# Patient Record
Sex: Female | Born: 1965 | Race: White | Hispanic: No | Smoking: Former smoker
Health system: Southern US, Community
[De-identification: ages and names within clinical notes are randomized; demographics above are authoritative.]

## PROBLEM LIST (undated history)

## (undated) DIAGNOSIS — E669 Obesity, unspecified: Secondary | ICD-10-CM

## (undated) DIAGNOSIS — E119 Type 2 diabetes mellitus without complications: Principal | ICD-10-CM

## (undated) DIAGNOSIS — J439 Emphysema, unspecified: Secondary | ICD-10-CM

## (undated) DIAGNOSIS — K219 Gastro-esophageal reflux disease without esophagitis: Secondary | ICD-10-CM

## (undated) DIAGNOSIS — L80 Vitiligo: Secondary | ICD-10-CM

## (undated) DIAGNOSIS — F419 Anxiety disorder, unspecified: Secondary | ICD-10-CM

## (undated) DIAGNOSIS — R52 Pain, unspecified: Secondary | ICD-10-CM

## (undated) DIAGNOSIS — I10 Essential (primary) hypertension: Secondary | ICD-10-CM

## (undated) DIAGNOSIS — M797 Fibromyalgia: Secondary | ICD-10-CM

## (undated) DIAGNOSIS — M1990 Unspecified osteoarthritis, unspecified site: Secondary | ICD-10-CM

## (undated) DIAGNOSIS — I8390 Asymptomatic varicose veins of unspecified lower extremity: Secondary | ICD-10-CM

## (undated) DIAGNOSIS — J45909 Unspecified asthma, uncomplicated: Secondary | ICD-10-CM

## (undated) DIAGNOSIS — E785 Hyperlipidemia, unspecified: Secondary | ICD-10-CM

## (undated) DIAGNOSIS — F32A Depression, unspecified: Secondary | ICD-10-CM

## (undated) DIAGNOSIS — F329 Major depressive disorder, single episode, unspecified: Secondary | ICD-10-CM

## (undated) HISTORY — DX: Pain, unspecified: R52

## (undated) HISTORY — DX: Hyperlipidemia, unspecified: E78.5

## (undated) HISTORY — DX: Fibromyalgia: M79.7

## (undated) HISTORY — DX: Vitiligo: L80

## (undated) HISTORY — DX: Essential (primary) hypertension: I10

## (undated) HISTORY — DX: Type 2 diabetes mellitus without complications: E11.9

## (undated) HISTORY — DX: Major depressive disorder, single episode, unspecified: F32.9

## (undated) HISTORY — PX: CHOLECYSTECTOMY: SHX55

## (undated) HISTORY — DX: Unspecified asthma, uncomplicated: J45.909

## (undated) HISTORY — PX: GALLBLADDER SURGERY: SHX652

## (undated) HISTORY — PX: TUBAL LIGATION: SHX77

## (undated) HISTORY — DX: Asymptomatic varicose veins of unspecified lower extremity: I83.90

## (undated) HISTORY — DX: Anxiety disorder, unspecified: F41.9

## (undated) HISTORY — PX: LEG SURGERY: SHX1003

## (undated) HISTORY — PX: DILATION AND CURETTAGE OF UTERUS: SHX78

## (undated) HISTORY — DX: Emphysema, unspecified: J43.9

## (undated) HISTORY — DX: Depression, unspecified: F32.A

## (undated) HISTORY — DX: Obesity, unspecified: E66.9

## (undated) HISTORY — DX: Gastro-esophageal reflux disease without esophagitis: K21.9

## (undated) HISTORY — DX: Unspecified osteoarthritis, unspecified site: M19.90

---

## 2001-05-02 ENCOUNTER — Encounter: Payer: Self-pay | Admitting: Obstetrics and Gynecology

## 2001-05-02 ENCOUNTER — Ambulatory Visit (HOSPITAL_COMMUNITY): Admission: RE | Admit: 2001-05-02 | Discharge: 2001-05-02 | Payer: Self-pay | Admitting: Obstetrics and Gynecology

## 2001-08-22 ENCOUNTER — Other Ambulatory Visit: Admission: RE | Admit: 2001-08-22 | Discharge: 2001-08-22 | Payer: Self-pay | Admitting: Obstetrics and Gynecology

## 2001-09-07 ENCOUNTER — Ambulatory Visit: Admission: RE | Admit: 2001-09-07 | Discharge: 2001-09-07 | Payer: Self-pay | Admitting: Internal Medicine

## 2002-08-31 ENCOUNTER — Encounter: Payer: Self-pay | Admitting: Obstetrics and Gynecology

## 2002-08-31 ENCOUNTER — Ambulatory Visit (HOSPITAL_COMMUNITY): Admission: RE | Admit: 2002-08-31 | Discharge: 2002-08-31 | Payer: Self-pay | Admitting: Obstetrics and Gynecology

## 2003-10-24 ENCOUNTER — Ambulatory Visit (HOSPITAL_COMMUNITY): Admission: RE | Admit: 2003-10-24 | Discharge: 2003-10-24 | Payer: Self-pay | Admitting: Obstetrics and Gynecology

## 2004-01-23 ENCOUNTER — Ambulatory Visit (HOSPITAL_COMMUNITY): Admission: RE | Admit: 2004-01-23 | Discharge: 2004-01-23 | Payer: Self-pay | Admitting: Orthopedic Surgery

## 2006-08-02 ENCOUNTER — Ambulatory Visit (HOSPITAL_COMMUNITY): Admission: RE | Admit: 2006-08-02 | Discharge: 2006-08-02 | Payer: Self-pay | Admitting: Obstetrics and Gynecology

## 2007-09-01 ENCOUNTER — Other Ambulatory Visit: Admission: RE | Admit: 2007-09-01 | Discharge: 2007-09-01 | Payer: Self-pay | Admitting: Obstetrics and Gynecology

## 2009-07-16 ENCOUNTER — Ambulatory Visit (HOSPITAL_COMMUNITY): Admission: RE | Admit: 2009-07-16 | Discharge: 2009-07-16 | Payer: Self-pay | Admitting: Obstetrics & Gynecology

## 2009-07-25 ENCOUNTER — Other Ambulatory Visit: Admission: RE | Admit: 2009-07-25 | Discharge: 2009-07-25 | Payer: Self-pay | Admitting: Obstetrics and Gynecology

## 2009-07-25 ENCOUNTER — Ambulatory Visit (HOSPITAL_COMMUNITY): Admission: RE | Admit: 2009-07-25 | Discharge: 2009-07-25 | Payer: Self-pay | Admitting: Obstetrics and Gynecology

## 2010-12-05 ENCOUNTER — Emergency Department (HOSPITAL_COMMUNITY): Payer: Self-pay

## 2010-12-05 ENCOUNTER — Emergency Department (HOSPITAL_COMMUNITY)
Admission: EM | Admit: 2010-12-05 | Discharge: 2010-12-05 | Disposition: A | Discharge status: Home / Self Care | Payer: Self-pay | Attending: Emergency Medicine | Admitting: Emergency Medicine

## 2010-12-05 DIAGNOSIS — I10 Essential (primary) hypertension: Secondary | ICD-10-CM | POA: Insufficient documentation

## 2010-12-05 DIAGNOSIS — J189 Pneumonia, unspecified organism: Secondary | ICD-10-CM | POA: Insufficient documentation

## 2010-12-05 DIAGNOSIS — Z79899 Other long term (current) drug therapy: Secondary | ICD-10-CM | POA: Insufficient documentation

## 2010-12-05 DIAGNOSIS — J45909 Unspecified asthma, uncomplicated: Secondary | ICD-10-CM | POA: Insufficient documentation

## 2010-12-05 DIAGNOSIS — IMO0001 Reserved for inherently not codable concepts without codable children: Secondary | ICD-10-CM | POA: Insufficient documentation

## 2010-12-26 NOTE — H&P (Signed)
NAMEPLACIDA, Erica Graham              ACCOUNT NO.:  [id number]   MEDICAL RECORD NO.:  [id number]          PATIENT TYPE:  AMB   LOCATION:                                FACILITY:  [facility]   PHYSICIAN:  Tilda Burrow, M.D. DATE OF BIRTH:  Nov 16, 1965   DATE OF ADMISSION:  DATE OF DISCHARGE:  LH                              HISTORY & PHYSICAL   ADMISSION DIAGNOSIS:  1. Chronic dysmenorrhea, menorrhagia.  2. Long term Depo-Provera use, requesting discontinuation.   HISTORY OF PRESENT ILLNESS:  This 45 year old female with 15 years of  Depo-Provera use for control of menses is admitted at this time for  endometrial ablation to resolve the bleeding by a method that would  allow her to get off the Depo-Provera, thus avoiding the issues of  chronic bone-thinning with long-term Depo-Provera use.  The plans are  for tubal sterilization as well as endometrial ablation to be performed  on December 24.  She understands the permanence of the requested  procedure.   PAST MEDICAL HISTORY:  1. Chronic pain syndrome with fibromyalgia.  2. Chronic neck soreness secondary to fibromyalgia.  3. Previously mentioned lifelong dysmenorrhea.  4. Hypertension.   PAST SURGICAL HISTORY:  1. Cholecystectomy.  2. Dilatation and curettage x2.   PHYSICAL EXAMINATION:   PHYSICAL EXAMINATION:  GENERAL:  Large framed, Caucasian female.  VITAL SIGNS:  Height 5 feet 5, weight 248.  Blood pressure 150/80, pulse  70.  HEENT:  Pupils are equal, round and reactive.  NECK:  Supple. Normal thyroid.  BREASTS:  Deferred.  CHEST:  Clear to auscultation.  ABDOMEN:  Nontender.  Obese.  Well-healed surgical scars.  EXTERNAL GENITALIA:  Multiparous.  PELVIC:  Vaginal length significant.  Cervix palpably normal.  Uterus  mobile, nontender without masses or  tenderness in either adnexa.  EXTREMITIES:  Grossly normal.   PLAN:  LTS Falope rings.  Endometrial ablation.  Hysteroscopy and  dilatation and curettage August 02, 2006.      Tilda Burrow, M.D.  Electronically Signed     JVF/MEDQ  D:  07/28/2006  T:  07/28/2006  Job:  [id number]

## 2010-12-26 NOTE — Op Note (Signed)
NAMETARRIE, MCMICHEN                        ACCOUNT NO.:  [id number]   MEDICAL RECORD NO.:  [id number]                   PATIENT TYPE:  OIB   LOCATION:  2887                                 FACILITY:  [facility]   PHYSICIAN:  Nadara Mustard, M.D.                DATE OF BIRTH:  March 21, 1966   DATE OF PROCEDURE:  01/23/2004  DATE OF DISCHARGE:                                 OPERATIVE REPORT   PREOPERATIVE DIAGNOSIS:  Trimalleolar left ankle fracture.   POSTOPERATIVE DIAGNOSIS:  Trimalleolar left ankle fracture.   OPERATION PERFORMED:  Open reduction internal fixation left fibula.   SURGEON:  Nadara Mustard, M.D.   ANESTHESIA:  General.   ESTIMATED BLOOD LOSS:  Minimal.   ANTIBIOTICS:  1g Kefzol.   TOURNIQUET TIME:  None.   DISPOSITION:  To post anesthesia care unit in stable condition.   INDICATIONS FOR PROCEDURE:  The patient is a 45 year old woman who was  status post a left trimalleolar ankle fracture, status post a fall from  stepping off a curb.  The patient was initially evaluated at the [facility] area, was placed in a splint and referred to my office.  The patient  has an unstable fracture and presents at this time for surgical  intervention.  The risks and benefits were discussed including infection,  neurovascular injury, persistent pain, arthritis, need for additional  surgery.  The patient states she understands and wishes to proceed at this  time.   DESCRIPTION OF PROCEDURE:  The patient was brought to operating room 1 and  underwent a popliteal block.  After adequate level of anesthesia obtained,  the patient's left lower extremity was prepped using DuraPrep and draped  into a sterile field.  An Collier Flowers was used to cover all exposed skin.  A  lateral incision was made over the fibula.  This was carried down to the  fracture sites.  Subperiosteal dissection was performed.  The fracture was  reduced and was a very large oblique fracture.  This was reduced then  and  two 3.5 cortical screws were used for lag screw fixation.  A neutralization  plate was placed laterally with two locking screws distally and three  locking screws proximally.  The wound was irrigated with normal saline.  C-  arm fluoroscopy verified reduction.  The ankle was congruent.  There was no  subluxation with stressing laterally.  The subcutaneous was closed using 2-0  Vicryl.  The skin was closed using Proximate staples.  The wound was covered  with Adaptic orthopedic sponges, sterile Webril and a Coban dressing.  The  patient was taken to the PACU in stable condition.  Plan for  nonweightbearing on the left lower extremity, continue with her crutches and  wheel chair.  Prescription given for Tylox for pain, plan for  discharge to home, plan to follow up in one week.  Hemostasis was obtained.  The wound  was closed using 2-0 nylon.  The wound  was covered with Adaptic orthopedic sponges, Kerlix and a Coban dressing.  The patient was then taken to PACU in stable condition.                                               Nadara Mustard, M.D.    MVD/MEDQ  D:  01/23/2004  T:  01/23/2004  Job:  [id number]

## 2010-12-26 NOTE — Op Note (Signed)
NAMESOPHYA, VANBLARCOM NO.:  [id number]   MEDICAL RECORD NO.:  [id number]          PATIENT TYPE:  AMB   LOCATION:  DAY                           FACILITY:  [facility]   PHYSICIAN:  Tilda Burrow, M.D. DATE OF BIRTH:  1965/12/24   DATE OF PROCEDURE:  08/02/2006  DATE OF DISCHARGE:                               OPERATIVE REPORT   PREOPERATIVE DIAGNOSIS:  Menorrhagia, long term Depo-Provera use x14  years.  Elective sterilization.   POSTOPERATIVE DIAGNOSIS:  Menorrhagia, long term Depo-Provera use x14  years.  Elective sterilization.   OPERATION PERFORMED:  Laparoscopic tubal sterilization, Falope rings,  hysteroscopy, endometrial ablation not performed.  Laparoscopic tubal  sterilization only performed.   SURGEON:  Tilda Burrow, M.D.   ANESTHESIA:   DESCRIPTION OF PROCEDURE:  The patient was taken to the operating room,  prepped and draped for abdominal and vaginal procedure.  Attention was  first directed to the abdomen where umbilical incision was performed as  well as suprapubic incision.  The Metzenbaum scissors were used probe  down through the fatty tissue sufficiently to allow access to the  fascia.  Veress needle was used using the standard water droplet  technique to confirm intraperitoneal location and pneumoperitoneum  achieved.  The 5 mm laparoscopic cannula was inserted  and pelvis  inspected.  We had to convert initial cameras due to technical  difficulties with the initial camera.  Checking our light source and the  __grounding____ wire and the fiberoptics proved to be negative.  After  satisfactory equipment was obtained with suprapubic trocar had been  inserted under direct visualization and attention directed to the left  tube which could be identified and elevated and the fallopian elevated,  identified and a Falope ring applied onto midportion.  The patient's  right side was more technically challenging due to the presence of bowel  that  could not be moved away from even despite maximized Trendelenburg  position and effort at uterine manipulation with the Hulka uterine  manipulator.  The cornu could eventually be exposed sufficiently to  identify both the utero-ovarian ligament and the fallopian tube on the  left, on the patient's right side and the Falope ring applied without  further ado.  Deflation of the abdomen was then followed by instilling  of 120 mL of saline and then the skin closed with subcuticular 4-0 Dexon  closure.   Hysteroscopy was then prepared for.  Grasped the cervix between the  opening of a standard Graves speculum and sounded the uterus.  The  uterus sound had absolutely no resistance and went sufficiently deep  that it is felt certain uterine perforation was present, was very small  and at this point procedure was discontinued.  It is felt likely that  the uterine perforation, small, occurred with the Hulka tenaculum during  uterine manipulation.  There is no suspicion of  bowel injury during this and the procedure was discontinued.  Efforts at  hysteroscopy will be rescheduled when endometrial ablation can be  performed as well.  The patient tolerated the procedure well.  Went to  recovery  room in good condition.  Will be monitored in follow-up.      Tilda Burrow, M.D.  Electronically Signed     JVF/MEDQ  D:  08/02/2006  T:  08/02/2006  Job:  [id number]

## 2011-01-01 ENCOUNTER — Emergency Department (HOSPITAL_COMMUNITY): Payer: Self-pay

## 2011-01-01 ENCOUNTER — Emergency Department (HOSPITAL_COMMUNITY)
Admission: EM | Admit: 2011-01-01 | Discharge: 2011-01-01 | Disposition: A | Discharge status: Home / Self Care | Payer: Self-pay | Attending: Emergency Medicine | Admitting: Emergency Medicine

## 2011-01-01 DIAGNOSIS — R059 Cough, unspecified: Secondary | ICD-10-CM | POA: Insufficient documentation

## 2011-01-01 DIAGNOSIS — I10 Essential (primary) hypertension: Secondary | ICD-10-CM | POA: Insufficient documentation

## 2011-01-01 DIAGNOSIS — J40 Bronchitis, not specified as acute or chronic: Secondary | ICD-10-CM | POA: Insufficient documentation

## 2011-01-01 DIAGNOSIS — R079 Chest pain, unspecified: Secondary | ICD-10-CM | POA: Insufficient documentation

## 2011-01-01 DIAGNOSIS — Z8701 Personal history of pneumonia (recurrent): Secondary | ICD-10-CM | POA: Insufficient documentation

## 2011-01-01 DIAGNOSIS — J45909 Unspecified asthma, uncomplicated: Secondary | ICD-10-CM | POA: Insufficient documentation

## 2011-01-01 DIAGNOSIS — R05 Cough: Secondary | ICD-10-CM | POA: Insufficient documentation

## 2011-01-01 DIAGNOSIS — IMO0001 Reserved for inherently not codable concepts without codable children: Secondary | ICD-10-CM | POA: Insufficient documentation

## 2012-05-29 IMAGING — CR DG CHEST 2V
2 series · 2 of 2 positions shown · non-contrast
Comparison: 01/23/2004

CLINICAL DATA: Short of breath/cough

CHEST - 2 VIEW

[view not recorded (1 of 2)]
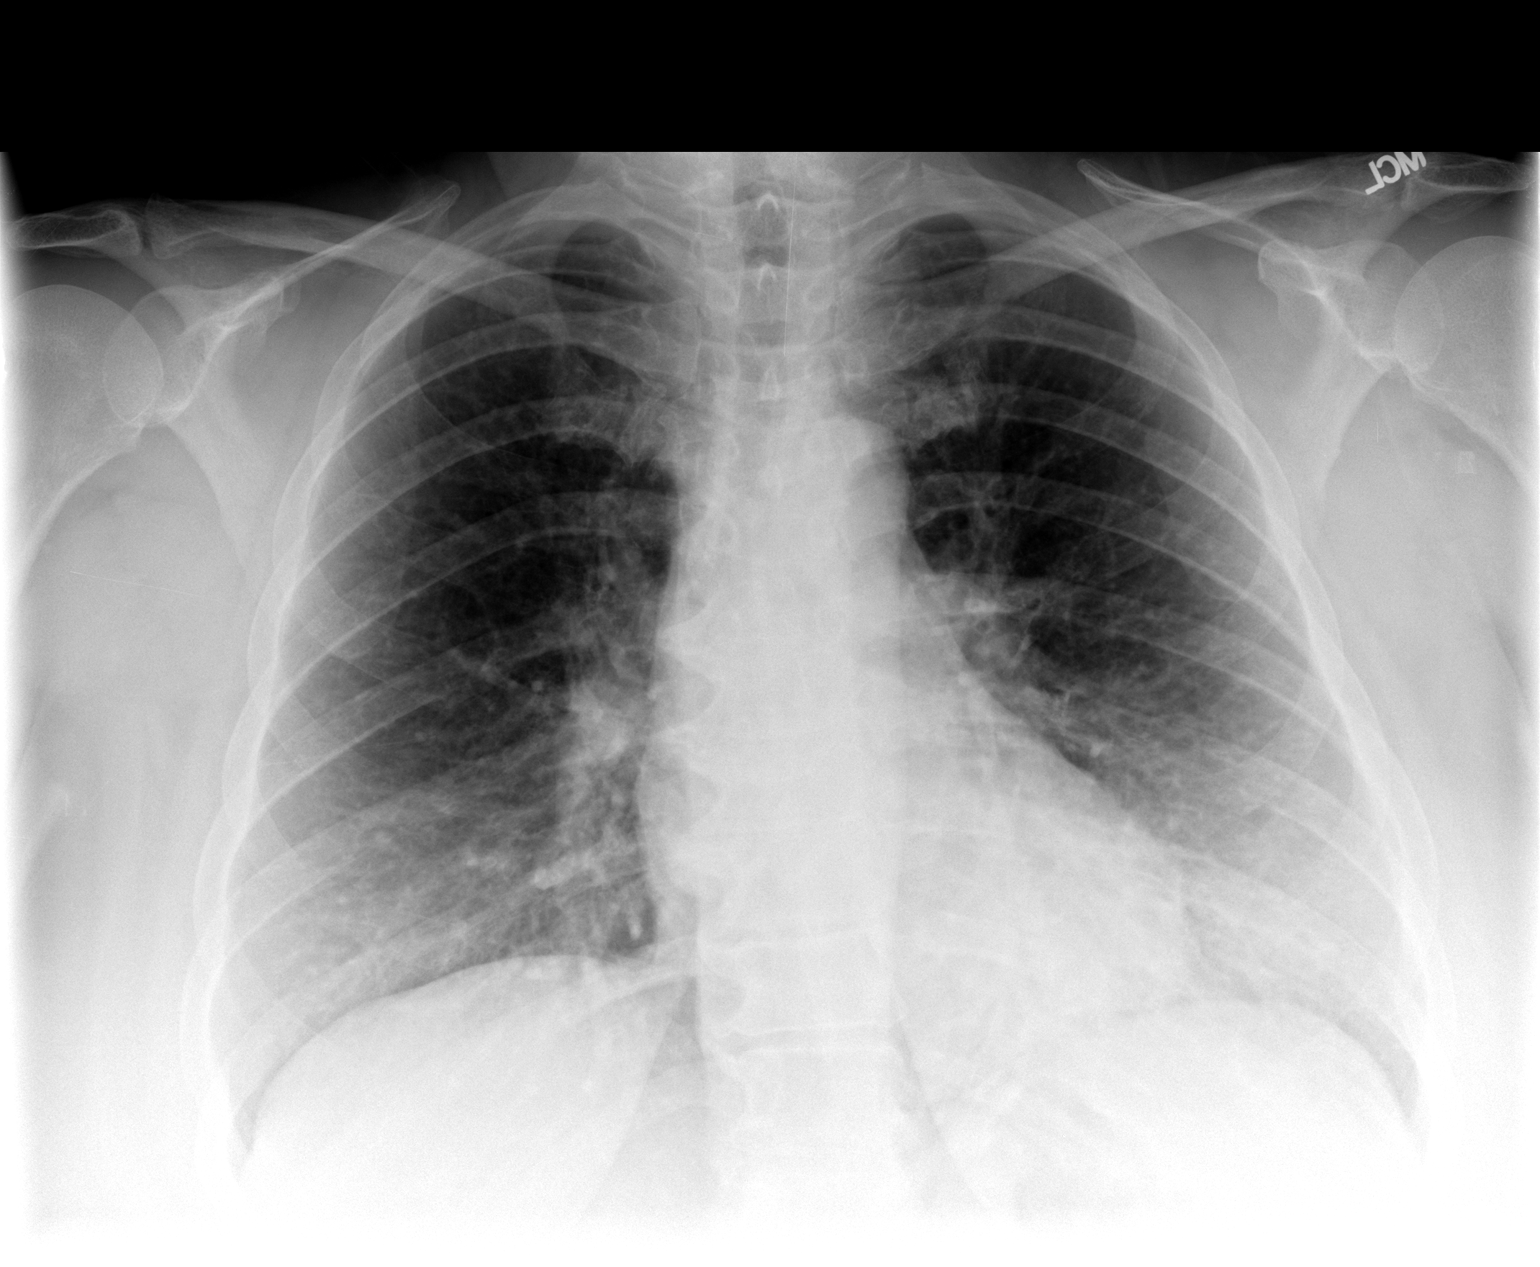

[view not recorded (2 of 2)]
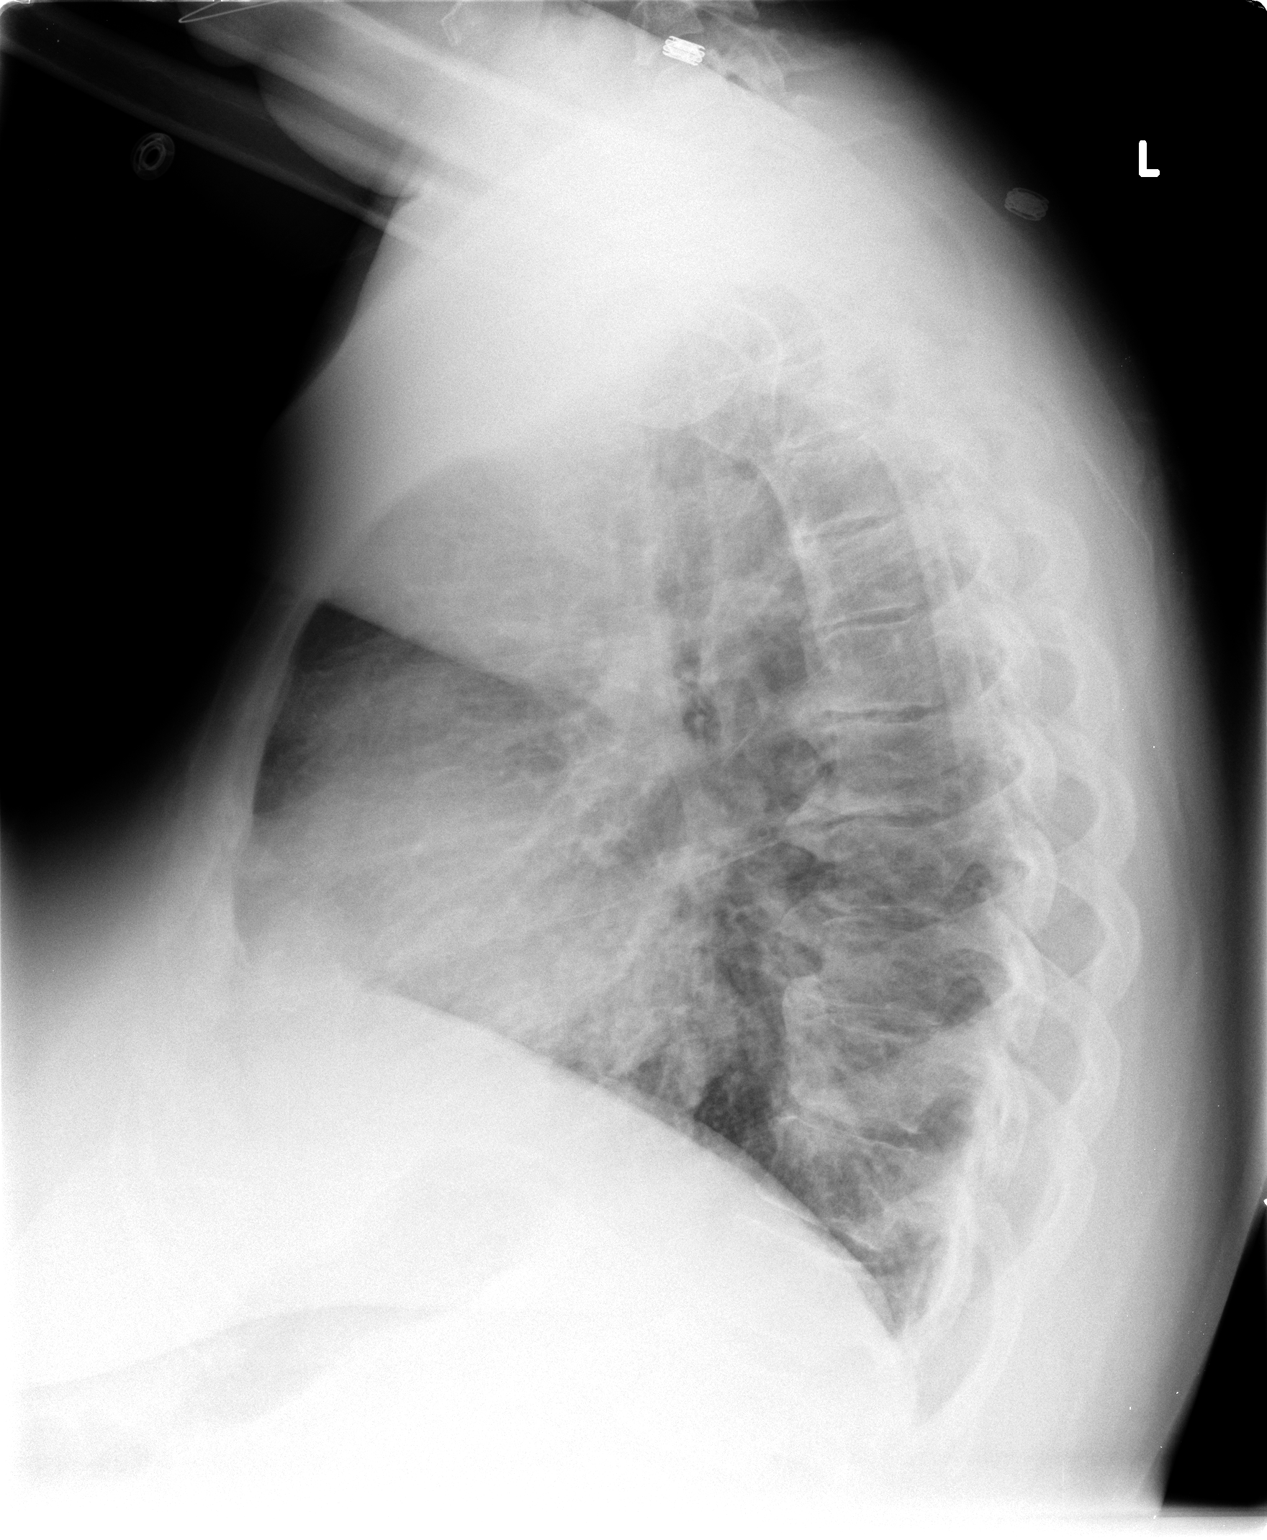

[2 of 2 positions shown; findings below may reference images not displayed]

FINDINGS: Heart and mediastinal contours normal.  There are bronchitic type
changes.  However, in addition, there is a suspicion of an acute
infiltrate in the left lower lobe.  This is seen through the left
heart shadow on the PA image, and in the retrocardiac area on the
lateral view.  No pleural fluid.  No heart failure.
IMPRESSION: Bronchitic type changes but also suspicious for left lower lobe
pneumonia.

## 2012-06-25 IMAGING — CR DG CHEST 2V
2 series · 2 of 2 positions shown · non-contrast
Comparison: 12/05/2010 and [id number].

CLINICAL DATA: Cough.  Pneumonia for 5 weeks with new chest and
left-sided pain at the diaphragm.  Shortness of breath with cough
and congestion.

CHEST - 2 VIEW

[view not recorded (1 of 2)]
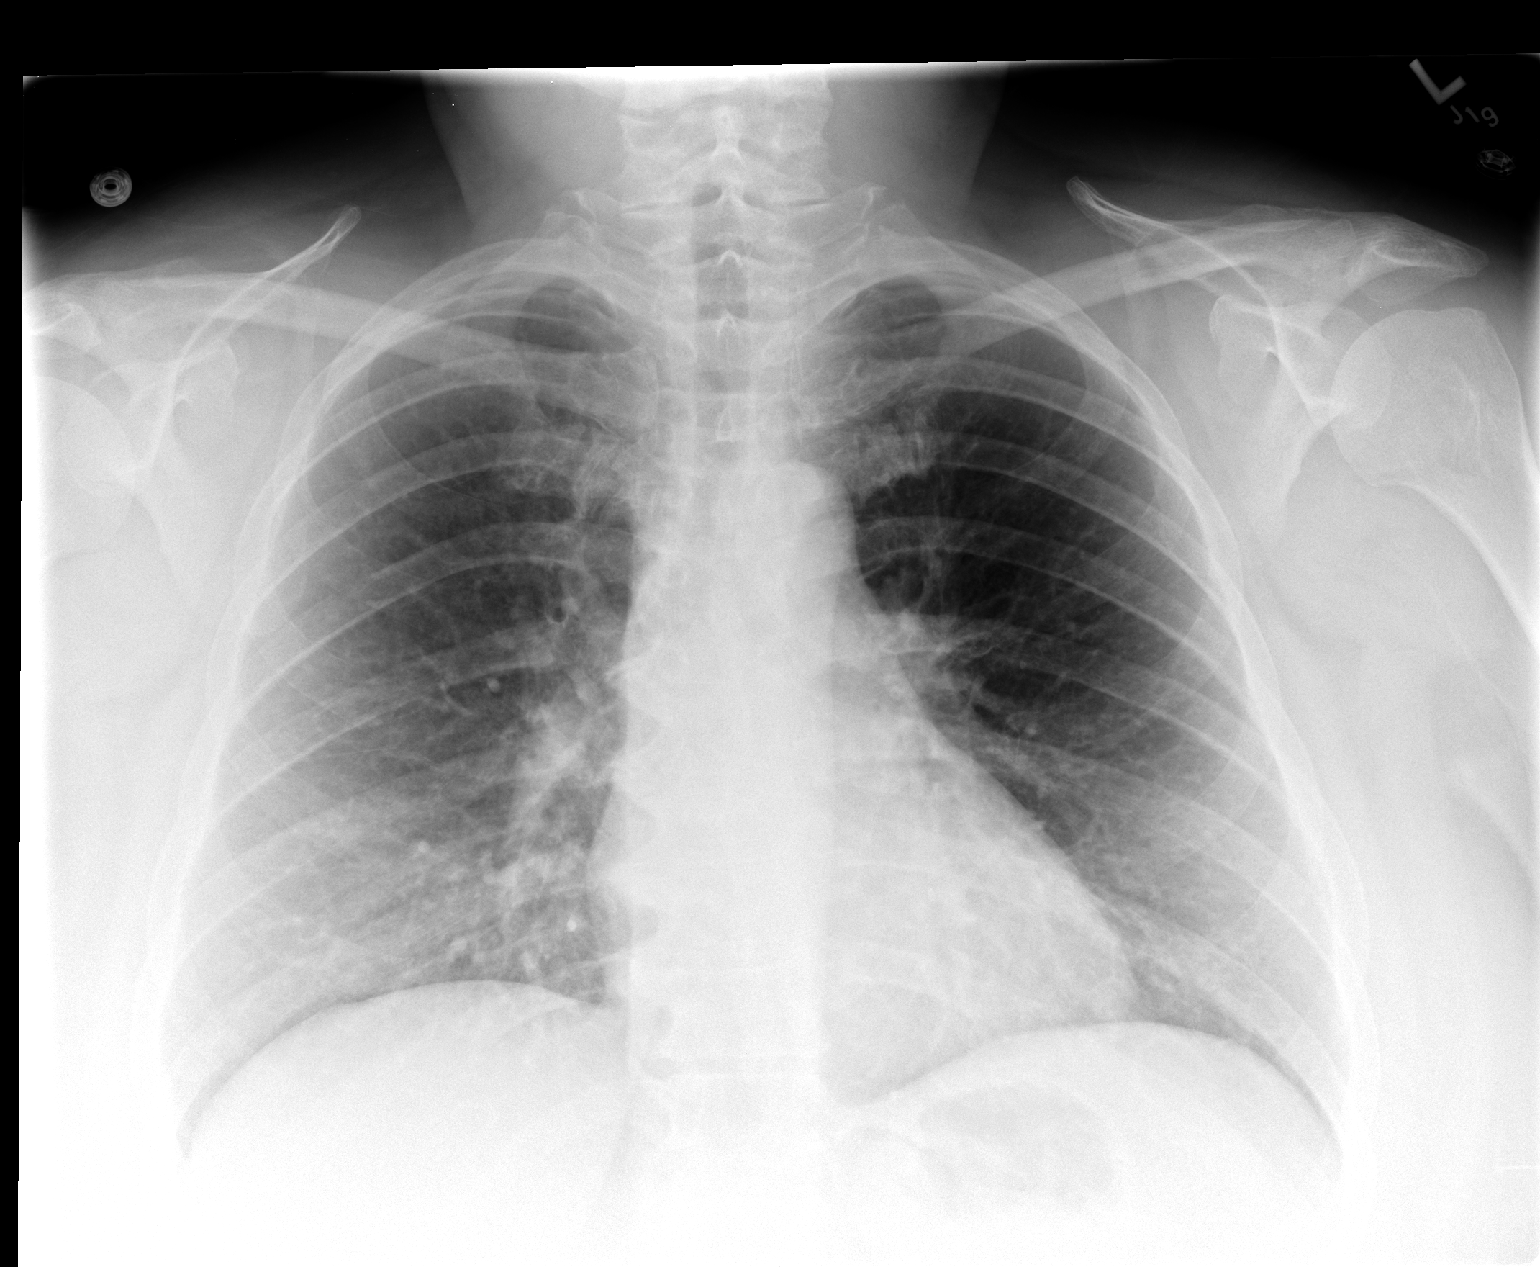

[view not recorded (2 of 2)]
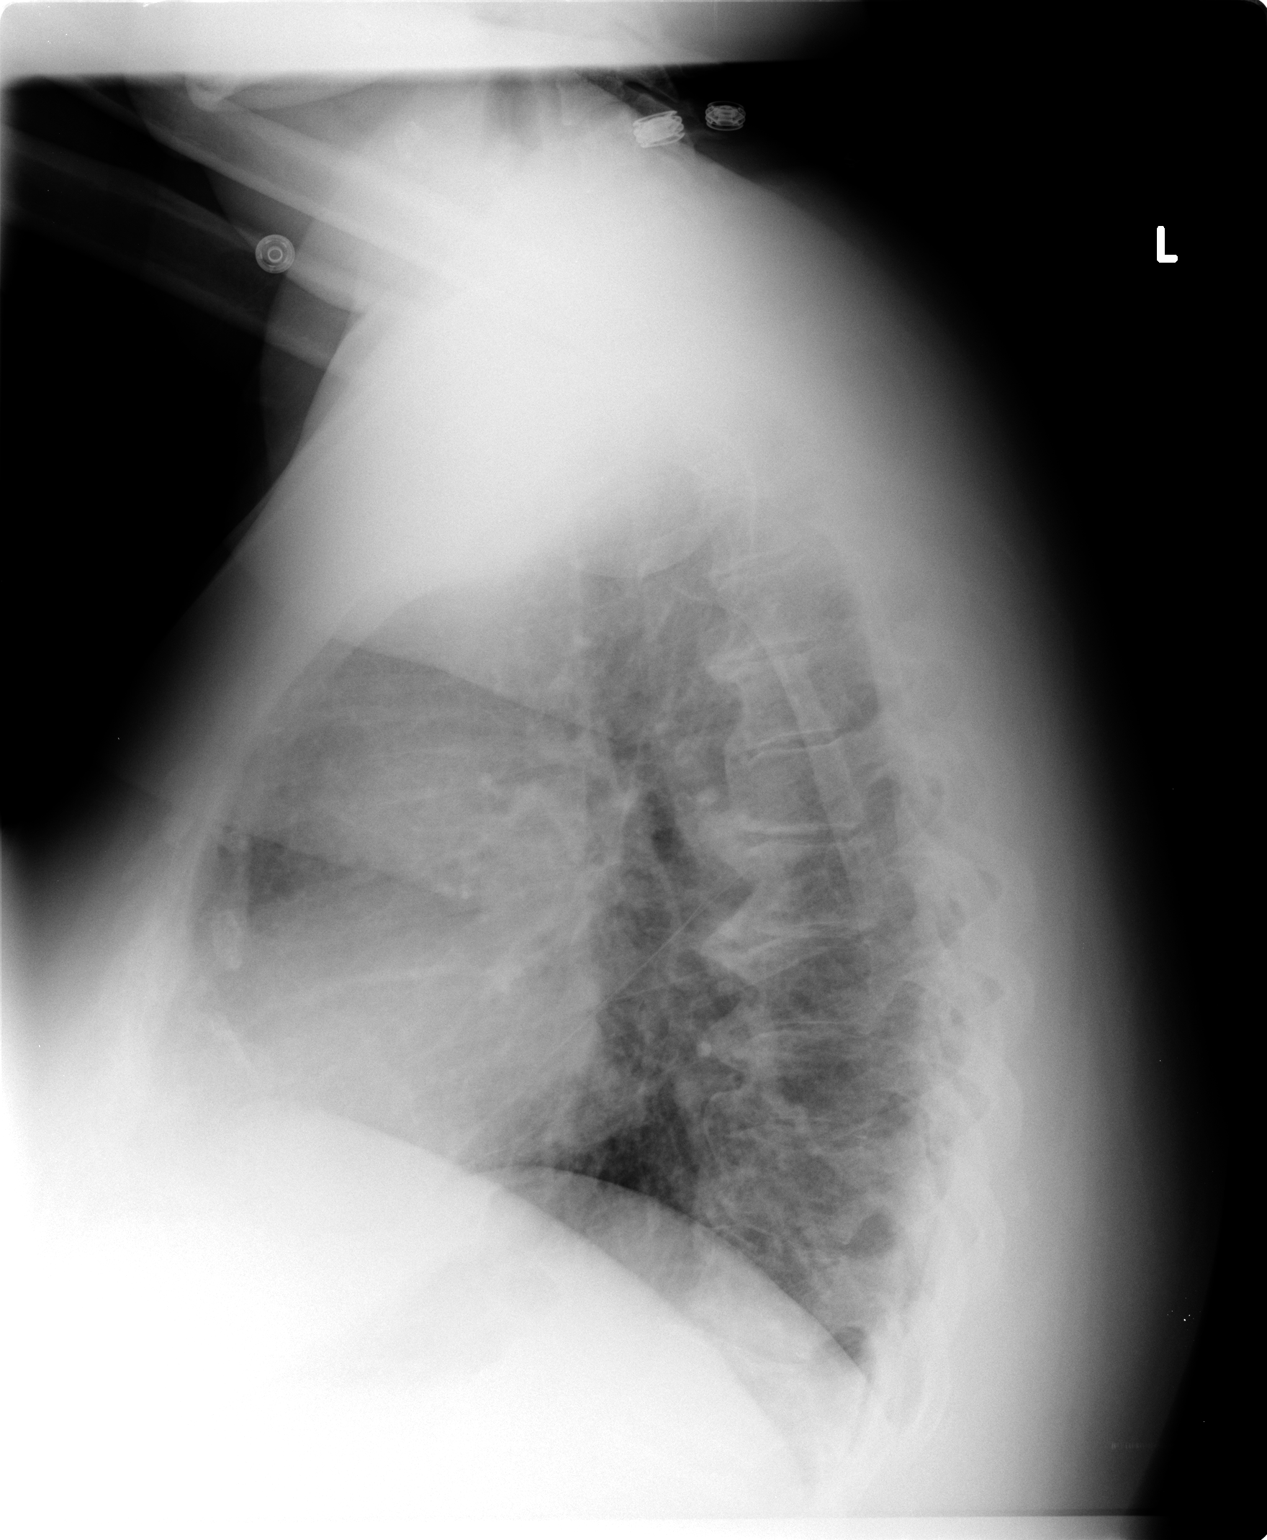

[2 of 2 positions shown; findings below may reference images not displayed]

FINDINGS: Heart and mediastinal contours are within normal limits.
There are coarse interstitial markings identified throughout both
lung fields compatible with the patient's history of smoking and
underlying bronchitic change.  No focal infiltrates or signs of
congestive failure are seen.  No pleural fluid or peribronchial
cuffing is noted. Focal density seen previously at the left lower
lobe has resolved.
IMPRESSION: Interval resolution of suspected area of left lower lobe pneumonia.
No new focal acute or worrisome findings noted.  Stable bronchitic
change

## 2016-03-05 ENCOUNTER — Encounter (INDEPENDENT_AMBULATORY_CARE_PROVIDER_SITE_OTHER): Payer: Self-pay | Admitting: *Deleted

## 2016-03-05 ENCOUNTER — Encounter: Payer: Self-pay | Admitting: Adult Health

## 2016-03-05 ENCOUNTER — Other Ambulatory Visit (HOSPITAL_COMMUNITY)
Admission: RE | Admit: 2016-03-05 | Discharge: 2016-03-05 | Disposition: A | Discharge status: Home / Self Care | Payer: BLUE CROSS/BLUE SHIELD | Source: Ambulatory Visit | Attending: Adult Health | Admitting: Adult Health

## 2016-03-05 ENCOUNTER — Ambulatory Visit (INDEPENDENT_AMBULATORY_CARE_PROVIDER_SITE_OTHER): Payer: BLUE CROSS/BLUE SHIELD | Admitting: Adult Health

## 2016-03-05 VITALS — BP 140/80 | HR 80 | Ht 65.5 in | Wt 242.5 lb

## 2016-03-05 DIAGNOSIS — Z1211 Encounter for screening for malignant neoplasm of colon: Secondary | ICD-10-CM | POA: Diagnosis not present

## 2016-03-05 DIAGNOSIS — L80 Vitiligo: Secondary | ICD-10-CM | POA: Insufficient documentation

## 2016-03-05 DIAGNOSIS — Z1151 Encounter for screening for human papillomavirus (HPV): Secondary | ICD-10-CM | POA: Diagnosis not present

## 2016-03-05 DIAGNOSIS — I8390 Asymptomatic varicose veins of unspecified lower extremity: Secondary | ICD-10-CM

## 2016-03-05 DIAGNOSIS — I8393 Asymptomatic varicose veins of bilateral lower extremities: Secondary | ICD-10-CM | POA: Diagnosis not present

## 2016-03-05 DIAGNOSIS — Z01419 Encounter for gynecological examination (general) (routine) without abnormal findings: Secondary | ICD-10-CM | POA: Insufficient documentation

## 2016-03-05 DIAGNOSIS — Z124 Encounter for screening for malignant neoplasm of cervix: Secondary | ICD-10-CM | POA: Diagnosis not present

## 2016-03-05 DIAGNOSIS — R52 Pain, unspecified: Secondary | ICD-10-CM | POA: Diagnosis not present

## 2016-03-05 DIAGNOSIS — F419 Anxiety disorder, unspecified: Secondary | ICD-10-CM | POA: Diagnosis not present

## 2016-03-05 DIAGNOSIS — Z01411 Encounter for gynecological examination (general) (routine) with abnormal findings: Secondary | ICD-10-CM

## 2016-03-05 DIAGNOSIS — Z139 Encounter for screening, unspecified: Secondary | ICD-10-CM

## 2016-03-05 HISTORY — DX: Asymptomatic varicose veins of unspecified lower extremity: I83.90

## 2016-03-05 HISTORY — DX: Anxiety disorder, unspecified: F41.9

## 2016-03-05 HISTORY — DX: Vitiligo: L80

## 2016-03-05 HISTORY — DX: Pain, unspecified: R52

## 2016-03-05 LAB — HEMOCCULT GUIAC POC 1CARD (OFFICE): FECAL OCCULT BLD: NEGATIVE

## 2016-03-05 NOTE — Progress Notes (Signed)
Patient ID: Erica Graham, female   DOB: Oct 20, 1965, 50 y.o.   MRN: [id number] History of Present Illness: Levern is a 50 year old white female, single in for a well woman gyn exam and pap.She has not been to doctor in years.She is hair Ecologist.   Current Medications, Allergies, Past Medical History, Past Surgical History, Family History and Social History were reviewed in [employer] record.     Review of Systems: Patient denies any daily headaches(but has occasionally) , hearing loss, fatigue, blurred vision, shortness of breath, chest pain, abdominal pain, problems with bowel movements, urination, or intercourse(not having sex).She has body aches from arthritis and fibromyalgia and has pain in right leg where has varicose vein.She has mood swings and anxiety but declines meds. She says some due to brother death this year and has 90 yo daughter getting Water quality scientist. 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   Physical Exam:BP 140/80 (BP Location: Left Arm, Patient Position: Sitting, Cuff Size: Large)   Pulse 80   Ht 5' 5.5" (1.664 m)   Wt 242 lb 8 oz (110 kg)   BMI 39.74 kg/m  General:  Well developed, well nourished, no acute distress Skin:  Warm and dry,has nodules/cysts on elbows and fingers and she has vitiligo from birth  Neck:  Midline trachea, normal thyroid, good ROM, no lymphadenopathy Lungs; Clear to auscultation bilaterally Breast:  No dominant palpable mass, retraction, or nipple discharge Cardiovascular: Regular rate and rhythm Abdomen:  Soft, non tender, no hepatosplenomegaly Pelvic:  External genitalia is normal in appearance, no lesions.  The vagina is normal in appearance. Urethra has no lesions or masses. The cervix is bulbous. Pap with HPV performed. Uterus is felt to be normal size, shape, and contour.  No adnexal masses or tenderness noted.Bladder is non tender, no masses felt. Rectal: Good sphincter tone, no polyps, or hemorrhoids felt.  Hemoccult  negative. Extremities/musculoskeletal:  No swelling, +varicosities in right leg noted, no clubbing or cyanosis Psych:  No mood changes, alert and cooperative,seems happy   Impression: Well woman gyn exam and pap Anxiety Body aches Varicose vein Vitiligo     Plan: Referred to Dr Laural Golden for screening colonoscopy Check CBC,CMP,TSH and lipids,A1c and vitamin D Get mammogram now, number given Call Dr Ree Edman for arthritis And call [facility] for consult  Physical in 1 year, pap in 3 if normal

## 2016-03-05 NOTE — Patient Instructions (Signed)
Call Dr Charlestine Night and [facility] Get mammogram Referred to Dr Laural Golden for colonoscopy  Physical in 1 year, pap in 3 if normal

## 2016-03-05 NOTE — Addendum Note (Signed)
Addended by: Linton Rump on: 03/05/2016 11:37 AM   Modules accepted: Orders

## 2016-03-06 LAB — COMPREHENSIVE METABOLIC PANEL
A/G RATIO: 1.3 (ref 1.2–2.2)
ALT: 40 IU/L — ABNORMAL HIGH (ref 0–32)
AST: 29 IU/L (ref 0–40)
Albumin: 4.3 g/dL (ref 3.5–5.5)
Alkaline Phosphatase: 120 IU/L — ABNORMAL HIGH (ref 39–117)
BUN / CREAT RATIO: 10 (ref 9–23)
BUN: 12 mg/dL (ref 6–24)
Bilirubin Total: 0.8 mg/dL (ref 0.0–1.2)
CALCIUM: 9.5 mg/dL (ref 8.7–10.2)
CHLORIDE: 98 mmol/L (ref 96–106)
CO2: 23 mmol/L (ref 18–29)
Creatinine, Ser: 1.15 mg/dL — ABNORMAL HIGH (ref 0.57–1.00)
GFR calc non Af Amer: 56 mL/min/{1.73_m2} — ABNORMAL LOW (ref >59)
GFR, EST AFRICAN AMERICAN: 64 mL/min/{1.73_m2} (ref >59)
Globulin, Total: 3.2 g/dL (ref 1.5–4.5)
Glucose: 117 mg/dL — ABNORMAL HIGH (ref 65–99)
Potassium: 4.4 mmol/L (ref 3.5–5.2)
SODIUM: 138 mmol/L (ref 134–144)
TOTAL PROTEIN: 7.5 g/dL (ref 6.0–8.5)

## 2016-03-06 LAB — LIPID PANEL
CHOL/HDL RATIO: 5.4 ratio — AB (ref 0.0–4.4)
Cholesterol, Total: 188 mg/dL (ref 100–199)
HDL: 35 mg/dL — AB (ref >39)
LDL Calculated: 126 mg/dL — ABNORMAL HIGH (ref 0–99)
Triglycerides: 133 mg/dL (ref 0–149)
VLDL CHOLESTEROL CAL: 27 mg/dL (ref 5–40)

## 2016-03-06 LAB — CBC
Hematocrit: 45.7 % (ref 34.0–46.6)
Hemoglobin: 15.8 g/dL (ref 11.1–15.9)
MCH: 32.9 pg (ref 26.6–33.0)
MCHC: 34.6 g/dL (ref 31.5–35.7)
MCV: 95 fL (ref 79–97)
PLATELETS: 173 10*3/uL (ref 150–379)
RBC: 4.8 x10E6/uL (ref 3.77–5.28)
RDW: 13.5 % (ref 12.3–15.4)
WBC: 9.5 10*3/uL (ref 3.4–10.8)

## 2016-03-06 LAB — HEMOGLOBIN A1C
ESTIMATED AVERAGE GLUCOSE: 154 mg/dL
HEMOGLOBIN A1C: 7 % — AB (ref 4.8–5.6)

## 2016-03-06 LAB — CYTOLOGY - PAP

## 2016-03-06 LAB — VITAMIN D 25 HYDROXY (VIT D DEFICIENCY, FRACTURES): Vit D, 25-Hydroxy: 48.3 ng/mL (ref 30.0–100.0)

## 2016-03-06 LAB — TSH: TSH: 1.69 u[IU]/mL (ref 0.450–4.500)

## 2016-03-09 ENCOUNTER — Other Ambulatory Visit: Payer: Self-pay | Admitting: Adult Health

## 2016-03-09 DIAGNOSIS — Z1231 Encounter for screening mammogram for malignant neoplasm of breast: Secondary | ICD-10-CM

## 2016-03-10 ENCOUNTER — Telehealth: Payer: Self-pay | Admitting: Adult Health

## 2016-03-10 ENCOUNTER — Encounter: Payer: Self-pay | Admitting: Adult Health

## 2016-03-10 DIAGNOSIS — E119 Type 2 diabetes mellitus without complications: Secondary | ICD-10-CM

## 2016-03-10 HISTORY — DX: Type 2 diabetes mellitus without complications: E11.9

## 2016-03-10 MED ORDER — LISINOPRIL 10 MG PO TABS: 10.0000 mg | ORAL_TABLET | Freq: Every day | ORAL | 3 refills | Status: DC

## 2016-03-10 MED ORDER — METFORMIN HCL 500 MG PO TABS: 500.0000 mg | ORAL_TABLET | Freq: Two times a day (BID) | ORAL | 3 refills | Status: DC

## 2016-03-10 MED ORDER — SIMVASTATIN 20 MG PO TABS: 20.0000 mg | ORAL_TABLET | Freq: Every day | ORAL | 3 refills | Status: DC

## 2016-03-10 NOTE — Telephone Encounter (Signed)
Will add lisinopril too

## 2016-03-10 NOTE — Telephone Encounter (Signed)
Pt aware of labs, and A1c 7 was means diabetes, will rx metformin 500 mg bid and zocor 20 mg 1 daily and refer to Dr Dorris Fetch, put in recall for 3 months.She is aware pap normal with negative HPV and is aware of other labs.Has appt for mammogram and one with [facility] and with dermatologist

## 2016-03-12 ENCOUNTER — Telehealth: Payer: Self-pay | Admitting: Adult Health

## 2016-03-12 ENCOUNTER — Ambulatory Visit (HOSPITAL_COMMUNITY)
Admission: RE | Admit: 2016-03-12 | Discharge: 2016-03-12 | Disposition: A | Discharge status: Home / Self Care | Payer: BLUE CROSS/BLUE SHIELD | Source: Ambulatory Visit | Attending: Adult Health | Admitting: Adult Health

## 2016-03-12 DIAGNOSIS — Z1231 Encounter for screening mammogram for malignant neoplasm of breast: Secondary | ICD-10-CM | POA: Diagnosis not present

## 2016-03-12 DIAGNOSIS — I83891 Varicose veins of right lower extremities with other complications: Secondary | ICD-10-CM | POA: Diagnosis not present

## 2016-03-12 DIAGNOSIS — I8311 Varicose veins of right lower extremity with inflammation: Secondary | ICD-10-CM | POA: Diagnosis not present

## 2016-03-12 DIAGNOSIS — I83811 Varicose veins of right lower extremities with pain: Secondary | ICD-10-CM | POA: Diagnosis not present

## 2016-03-12 NOTE — Telephone Encounter (Signed)
Pt went by Erica Graham and they did not have meds, so I called Economist did not see any meds, but epic shows it was called in 8/1 so gave [name] a verbal for lisinopril and Zocor and metformin, and kim is aware they are there.Erica Graham said she was on 3 BP meds after birth of her daughter and she had breathing issues with asthma, that was 16 years ago

## 2016-03-19 DIAGNOSIS — D225 Melanocytic nevi of trunk: Secondary | ICD-10-CM | POA: Diagnosis not present

## 2016-03-19 DIAGNOSIS — B078 Other viral warts: Secondary | ICD-10-CM | POA: Diagnosis not present

## 2016-03-19 DIAGNOSIS — Z1283 Encounter for screening for malignant neoplasm of skin: Secondary | ICD-10-CM | POA: Diagnosis not present

## 2016-03-19 DIAGNOSIS — L920 Granuloma annulare: Secondary | ICD-10-CM | POA: Diagnosis not present

## 2016-04-02 DIAGNOSIS — I83891 Varicose veins of right lower extremities with other complications: Secondary | ICD-10-CM | POA: Diagnosis not present

## 2016-04-02 DIAGNOSIS — I83811 Varicose veins of right lower extremities with pain: Secondary | ICD-10-CM | POA: Diagnosis not present

## 2016-04-02 DIAGNOSIS — I8311 Varicose veins of right lower extremity with inflammation: Secondary | ICD-10-CM | POA: Diagnosis not present

## 2016-04-09 DIAGNOSIS — I8311 Varicose veins of right lower extremity with inflammation: Secondary | ICD-10-CM | POA: Diagnosis not present

## 2016-04-09 DIAGNOSIS — I83811 Varicose veins of right lower extremities with pain: Secondary | ICD-10-CM | POA: Diagnosis not present

## 2016-04-30 ENCOUNTER — Ambulatory Visit (INDEPENDENT_AMBULATORY_CARE_PROVIDER_SITE_OTHER): Payer: BLUE CROSS/BLUE SHIELD | Admitting: "Endocrinology

## 2016-04-30 ENCOUNTER — Encounter: Payer: Self-pay | Admitting: "Endocrinology

## 2016-04-30 ENCOUNTER — Encounter: Payer: BLUE CROSS/BLUE SHIELD | Attending: "Endocrinology | Admitting: Nutrition

## 2016-04-30 VITALS — BP 167/90 | HR 81 | Ht 65.5 in | Wt 241.0 lb

## 2016-04-30 VITALS — Ht 65.0 in | Wt 241.0 lb

## 2016-04-30 DIAGNOSIS — Z713 Dietary counseling and surveillance: Secondary | ICD-10-CM | POA: Insufficient documentation

## 2016-04-30 DIAGNOSIS — E118 Type 2 diabetes mellitus with unspecified complications: Secondary | ICD-10-CM

## 2016-04-30 DIAGNOSIS — IMO0002 Reserved for concepts with insufficient information to code with codable children: Secondary | ICD-10-CM

## 2016-04-30 DIAGNOSIS — E119 Type 2 diabetes mellitus without complications: Secondary | ICD-10-CM | POA: Insufficient documentation

## 2016-04-30 DIAGNOSIS — E782 Mixed hyperlipidemia: Secondary | ICD-10-CM | POA: Insufficient documentation

## 2016-04-30 DIAGNOSIS — IMO0001 Reserved for inherently not codable concepts without codable children: Secondary | ICD-10-CM

## 2016-04-30 DIAGNOSIS — E1165 Type 2 diabetes mellitus with hyperglycemia: Secondary | ICD-10-CM | POA: Insufficient documentation

## 2016-04-30 DIAGNOSIS — E785 Hyperlipidemia, unspecified: Secondary | ICD-10-CM

## 2016-04-30 DIAGNOSIS — I10 Essential (primary) hypertension: Secondary | ICD-10-CM

## 2016-04-30 DIAGNOSIS — E669 Obesity, unspecified: Secondary | ICD-10-CM

## 2016-04-30 MED ORDER — LOSARTAN POTASSIUM 25 MG PO TABS: 25.0000 mg | ORAL_TABLET | Freq: Every day | ORAL | 2 refills | Status: DC

## 2016-04-30 MED ORDER — METFORMIN HCL 1000 MG PO TABS: 1000.0000 mg | ORAL_TABLET | Freq: Two times a day (BID) | ORAL | 2 refills | Status: DC

## 2016-04-30 MED ORDER — METFORMIN HCL 500 MG PO TABS: 500.0000 mg | ORAL_TABLET | Freq: Two times a day (BID) | ORAL | 2 refills | Status: DC

## 2016-04-30 NOTE — Progress Notes (Signed)
Subjective:    Patient ID: Erica Graham, female    DOB: 1966-02-09. Patient is being seen in consultation for management of diabetes requested by  Jonnie Kind, MD  Past Medical History:  Diagnosis Date  . Anxiety 03/05/2016  . Arthritis   . Body aches 03/05/2016  . Fibromyalgia   . Obesity   . Recent onset of diabetes mellitus ([place]) 03/10/2016   Referred to Dr Dorris Fetch and rx metformin 500 mg bid and zocor 20 mg 1 daily  . 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 Vitiligo 03/05/2016   Past Surgical History:  Procedure Laterality Date  . DILATION AND CURETTAGE OF UTERUS    . GALLBLADDER SURGERY    . LEG SURGERY Left    broke leg  . TUBAL LIGATION     Social History   Social History  . Marital status: Legally Separated    Spouse name: N/A  . Number of children: N/A  . Years of education: N/A   Social History Main Topics  . Smoking status: Current Every Day Smoker    Packs/day: 1.00    Years: 20.00    Types: Cigarettes  . Smokeless tobacco: Never Used  . Alcohol use Yes     Comment: occ  . Drug use: No  . Sexual activity: Not Currently    Birth control/ protection: Surgical     Comment: tubal   Other Topics Concern  . None   Social History Narrative  . None   Outpatient Encounter Prescriptions as of 04/30/2016  Medication Sig  . cholecalciferol (VITAMIN D) 1000 units tablet Take 5,000 Units by mouth daily.  . diphenhydrAMINE (BENADRYL) 25 MG tablet Take 50 mg by mouth. Takes at bedtime every night.  Marland Kitchen ibuprofen (ADVIL,MOTRIN) 200 MG tablet Take 400 mg by mouth as needed.  . metFORMIN (GLUCOPHAGE) 500 MG tablet Take 1 tablet (500 mg total) by mouth 2 (two) times daily with a meal.  . [DISCONTINUED] metFORMIN (GLUCOPHAGE) 1000 MG tablet Take 1 tablet (1,000 mg total) by mouth 2 (two) times daily with a meal.  . [DISCONTINUED] metFORMIN (GLUCOPHAGE) 500 MG tablet Take 1 tablet (500 mg total) by mouth 2 (two) times daily with a meal.  . losartan (COZAAR) 25 MG tablet  Take 1 tablet (25 mg total) by mouth daily.  . simvastatin (ZOCOR) 20 MG tablet Take 1 tablet (20 mg total) by mouth daily. (Patient not taking: Reported on 04/30/2016)  . [DISCONTINUED] lisinopril (PRINIVIL,ZESTRIL) 10 MG tablet Take 1 tablet (10 mg total) by mouth daily. (Patient not taking: Reported on 04/30/2016)   No facility-administered encounter medications on file as of 04/30/2016.    ALLERGIES: No Known Allergies VACCINATION STATUS:  There is no immunization history on file for this patient.  Diabetes  She presents for her initial diabetic visit. She has type 2 diabetes mellitus. Onset time: She was diagnosed at approximate age of 50 years. Her disease course has been stable. There are no hypoglycemic associated symptoms. Pertinent negatives for hypoglycemia include no confusion, headaches, pallor or seizures. There are no diabetic associated symptoms. Pertinent negatives for diabetes include no chest pain and no polyphagia. There are no hypoglycemic complications. Symptoms are stable. Diabetic complications include nephropathy. Risk factors for coronary artery disease include diabetes mellitus, dyslipidemia, hypertension, obesity, tobacco exposure and sedentary lifestyle. Current diabetic treatment includes oral agent (monotherapy). Her weight is increasing steadily. She is following a generally unhealthy diet. When asked about meal planning, she reported none. She has had  a previous visit with a dietitian (She will see the dietitian today.). She never participates in exercise. An ACE inhibitor/angiotensin II receptor blocker is not being taken (She mentions adverse effects from lisinopril.).  Hyperlipidemia  This is a chronic problem. The current episode started more than 1 year ago. The problem is uncontrolled. Exacerbating diseases include diabetes and obesity. Associated symptoms include myalgias and shortness of breath. Pertinent negatives include no chest pain. She is currently on no  antihyperlipidemic treatment. The current treatment provides no improvement of lipids. Risk factors for coronary artery disease include diabetes mellitus, dyslipidemia, hypertension, obesity and a sedentary lifestyle.  Hypertension  This is a chronic problem. The current episode started more than 1 year ago. The problem is uncontrolled. Associated symptoms include shortness of breath. Pertinent negatives include no chest pain, headaches or palpitations. Risk factors for coronary artery disease include dyslipidemia, diabetes mellitus, family history, obesity, smoking/tobacco exposure and sedentary lifestyle. Past treatments include nothing. The current treatment provides no improvement. Hypertensive end-organ damage includes kidney disease.       Review of Systems  Constitutional: Negative for activity change, chills, fever and unexpected weight change.  HENT: Negative for trouble swallowing and voice change.   Eyes: Negative for visual disturbance.  Respiratory: Positive for shortness of breath. Negative for cough and wheezing.   Cardiovascular: Negative for chest pain, palpitations and leg swelling.  Gastrointestinal: Negative for diarrhea, nausea and vomiting.  Endocrine: Negative for cold intolerance, heat intolerance and polyphagia.  Genitourinary: Positive for frequency. Negative for dysuria and flank pain.  Musculoskeletal: Positive for myalgias. Negative for arthralgias.  Skin: Negative for color change, pallor, rash and wound.  Neurological: Negative for seizures and headaches.  Psychiatric/Behavioral: Negative for confusion and suicidal ideas.    Objective:    BP (!) 167/90   Pulse 81   Ht 5' 5.5" (1.664 m)   Wt 241 lb (109.3 kg)   BMI 39.49 kg/m   Wt Readings from Last 3 Encounters:  04/30/16 241 lb (109.3 kg)  03/05/16 242 lb 8 oz (110 kg)    Physical Exam  Constitutional: She is oriented to person, place, and time. She appears well-developed.  HENT:  Head:  Normocephalic and atraumatic.  Eyes: EOM are normal.  Neck: Normal range of motion. Neck supple. No tracheal deviation present. No thyromegaly present.  Cardiovascular: Normal rate and regular rhythm.   Pulmonary/Chest: Effort normal and breath sounds normal.  Abdominal: Soft. Bowel sounds are normal. There is no tenderness. There is no guarding.  Musculoskeletal: Normal range of motion. She exhibits no edema.  Neurological: She is alert and oriented to person, place, and time. She has normal reflexes. No cranial nerve deficit. Coordination normal.  Skin: Skin is warm and dry. No rash noted. No erythema. No pallor.  Psychiatric: She has a normal mood and affect. Judgment normal.  Reluctant affect.     CMP ( most recent) CMP     Component Value Date/Time   NA 138 03/05/2016 1127   K 4.4 03/05/2016 1127   CL 98 03/05/2016 1127   CO2 23 03/05/2016 1127   GLUCOSE 117 (H) 03/05/2016 1127   BUN 12 03/05/2016 1127   CREATININE 1.15 (H) 03/05/2016 1127   CALCIUM 9.5 03/05/2016 1127   PROT 7.5 03/05/2016 1127   ALBUMIN 4.3 03/05/2016 1127   AST 29 03/05/2016 1127   ALT 40 (H) 03/05/2016 1127   ALKPHOS 120 (H) 03/05/2016 1127   BILITOT 0.8 03/05/2016 1127   GFRNONAA 56 (L) 03/05/2016  [place] 03/05/2016 1127     Diabetic Labs (most recent): Lab Results  Component Value Date   HGBA1C 7.0 (H) 03/05/2016     Lipid Panel ( most recent) Lipid Panel     Component Value Date/Time   CHOL 188 03/05/2016 1127   TRIG 133 03/05/2016 1127   HDL 35 (L) 03/05/2016 1127   CHOLHDL 5.4 (H) 03/05/2016 1127   LDLCALC 126 (H) 03/05/2016 1127     Assessment & Plan:   1. Uncontrolled type 2 diabetes mellitus without complication, without long-term current use of insulin ([place])   - Patient has currently uncontrolled symptomatic type 2 DM since  50 years of age,  with most recent A1c of 7 %. Recent labs reviewed.   Her diabetes is complicated by chronic kidney disease, hypertension  with intolerance to ACE inhibitors, hyperlipidemia with intolerance to statins, obesity, chronic heavy smoking and patient remains at a high risk for more acute and chronic complications of diabetes which include CAD, CVA, CKD, retinopathy, and neuropathy. These are all discussed in detail with the patient.  - I have counseled the patient on diet management and weight loss, by adopting a carbohydrate restricted/protein rich diet.  - Suggestion is made for patient to avoid simple carbohydrates   from their diet including Cakes , Desserts, Ice Cream,  Soda (  diet and regular) , Sweet Tea , Candies,  Chips, Cookies, Artificial Sweeteners,   and "Sugar-free" Products . This will help patient to have stable blood glucose profile and potentially avoid unintended weight gain.  - I encouraged the patient to switch to  unprocessed or minimally processed complex starch and increased protein intake (animal or plant source), fruits, and vegetables.  - Patient is advised to stick to a routine mealtimes to eat 3 meals  a day and avoid unnecessary snacks ( to snack only to correct hypoglycemia).  - The patient will be scheduled with Jearld Fenton, RDN, CDE for individualized DM education.  - I have approached patient with the following individualized plan to manage diabetes and patient agrees:   - For her type 2 diabetes which is relatively recent onset she would not need any more therapy than metformin. - Due to abnormal renal function currently, I like to keep the dose lower at 500 mg by mouth twice a day.  -Patient is not a candidate for  SGLT2 i due to CKD.  - Patient will be considered for incretin therapy if necessary next visit.  - Patient specific target  A1c;  LDL, HDL, Triglycerides, and  Waist Circumference were discussed in detail.  2) BP/HTN:  Uncontrolled. I approached her with the idea that the pressure has to be treated to prevent further decline in renal function. She reports intolerance to  lisinopril. I gave her a prescription for losartan 25 minute grams by mouth every morning. Even more important for her is quite smoking, which is emphasized heavy drinking in the entire visit. 3) Lipids/HPL:  Uncontrolled with LDL 126.   She mentions intolerance to simvastatin. On subsequent visit she'll be tried on low-dose Crestor. 4)  Weight/Diet:  she is obese, CDE Consult will be initiated , exercise, and detailed carbohydrates information provided.  5) Chronic Care/Health Maintenance:  -Patient is started on ARB and  will be considered for Statin medications and encouraged to continue to follow up with Ophthalmology, Podiatrist at least yearly or according to recommendations, and advised to  Quit smoking. I have recommended yearly flu vaccine and  pneumonia vaccination at least every 5 years; moderate intensity exercise for up to 150 minutes weekly; and  sleep for at least 7 hours a day.  - 60 minutes of time was spent on the care of this patient , 50% of which was applied for counseling on diabetes complications and their preventions.  - Patient to bring meter and  blood glucose logs during their next visit.   - I advised patient to maintain close follow up with Jonnie Kind, MD for primary care needs.  Follow up plan: - Return in 10 weeks with repeat labs for reevaluation.  Glade Lloyd, MD Phone: [phone]  Fax: [phone]   04/30/2016, 10:01 AM

## 2016-04-30 NOTE — Progress Notes (Signed)
  Medical Nutrition Therapy:  Appt start time: 0930 end time:  1000.   Assessment:  Primary concerns today:  Diabetes. New DM Type 2.. Needs family PCP.  Here to see DR. Nida today. Currently taking Metformin 500 mg BID.  Walk in visit. Will ask safety questions at next visit.  PMH: HTN, Obesity and Hyperlipidemia. Not exercising. She notes she was on Lisinopril but stopped it due to coughing. Isn't taking cholesterol meds due to muscle aches ??   Lab Results  Component Value Date   HGBA1C 7.0 (H) 03/05/2016      Preferred Learning Style:   Auditory  Visual  Hands on  No preference indicated    Learning Readiness:    Ready  Change in progress   MEDICATIONS: see list    DIETARY INTAKE:      24-hr recall:  B ( AM): fruit, cheese,  water Snk ( AM):  L ( PM): prezels, water Snk ( PM D ( PM): cheeseburger, water, dill pickle chips Snk ( PM): water, sweet tea,  Beverages: water, sweet tea, occassional  Usual physical activity: ADL   Estimated energy needs: 1500 calories 170 g carbohydrates 112 g protein 42 g fat  Progress Towards Goal(s):  In progress.   Nutritional Diagnosis:  NB-1.1 Food and nutrition-related knowledge deficit As related to Diabetes.  As evidenced by A1C .    Intervention:  Nutrition and Diabetes education provided on My Plate, CHO counting, meal planning, portion sizes, timing of meals, avoiding snacks between meals unless having a low blood sugar, target ranges for A1C and blood sugars, signs/symptoms and treatment of hyper/hypoglycemia, monitoring blood sugars, taking medications as prescribed, benefits of exercising 30 minutes per day and prevention of complications of DM.  Goals 1. Follow My Plate 2. Eat 30-45 grams of carbs per meal 3. Increase fresh fruits and low carb vegetables 4. Drink water 5. Cut out processed foods; chips, cookies and junk food. 6. Eat meals on time. 7. Take Metformin after breakfast and dinner. 8. Exercise  60 minutes 4 days per week.  Teaching Method Utilized:  Visual Auditory Hands on  Handouts given during visit include:  The Plate Method  Meal Plan Card  Diabetes Instructions   Barriers to learning/adherence to lifestyle change:  none  Demonstrated degree of understanding via:  Teach Back   Monitoring/Evaluation:  Dietary intake, exercise, meal planning,  and body weight in 1 month(s).

## 2016-05-04 NOTE — Patient Instructions (Signed)
Goals 1. Follow My Plate 2. Eat 30-45 grams of carbs per meal 3. Increase fresh fruits and low carb vegetables 4. Drink water 5. Cut out processed foods; chips, cookies and junk food. 6. Eat meals on time. 7. Take Metformin after breakfast and dinner. 8. Exercise 60 minutes 4 days per week.

## 2016-05-07 ENCOUNTER — Other Ambulatory Visit (INDEPENDENT_AMBULATORY_CARE_PROVIDER_SITE_OTHER): Payer: Self-pay | Admitting: *Deleted

## 2016-05-07 DIAGNOSIS — Z1211 Encounter for screening for malignant neoplasm of colon: Secondary | ICD-10-CM

## 2016-05-18 ENCOUNTER — Ambulatory Visit: Payer: BLUE CROSS/BLUE SHIELD | Admitting: Nutrition

## 2016-05-21 ENCOUNTER — Encounter: Payer: Self-pay | Admitting: Nutrition

## 2016-05-21 ENCOUNTER — Encounter: Payer: BLUE CROSS/BLUE SHIELD | Attending: "Endocrinology | Admitting: Nutrition

## 2016-05-21 ENCOUNTER — Telehealth: Payer: Self-pay | Admitting: Adult Health

## 2016-05-21 VITALS — Ht 65.5 in | Wt 232.0 lb

## 2016-05-21 DIAGNOSIS — E118 Type 2 diabetes mellitus with unspecified complications: Secondary | ICD-10-CM

## 2016-05-21 DIAGNOSIS — E785 Hyperlipidemia, unspecified: Secondary | ICD-10-CM

## 2016-05-21 DIAGNOSIS — Z713 Dietary counseling and surveillance: Secondary | ICD-10-CM | POA: Diagnosis not present

## 2016-05-21 DIAGNOSIS — E1165 Type 2 diabetes mellitus with hyperglycemia: Secondary | ICD-10-CM

## 2016-05-21 DIAGNOSIS — IMO0002 Reserved for concepts with insufficient information to code with codable children: Secondary | ICD-10-CM

## 2016-05-21 DIAGNOSIS — E1169 Type 2 diabetes mellitus with other specified complication: Secondary | ICD-10-CM

## 2016-05-21 MED ORDER — LORAZEPAM 0.5 MG PO TABS: ORAL_TABLET | ORAL | 0 refills | Status: DC

## 2016-05-21 NOTE — Patient Instructions (Signed)
Goals Keep up the great job!  1 Continue to eat three meals per day 2. Increase water to 64 oz per day 3. Continue to add more fresh fruits and vegetables and whole grains to your diet. 4. Take Metformin AFTER breakfast and after super/bedtime. 5. Lose 1-2 lbs per week. Get A1C to 6% or less.

## 2016-05-21 NOTE — Progress Notes (Signed)
  Medical Nutrition Therapy:  Appt start time: 0930 end time:  1000.  Assessment:  Primary concerns today:  Diabetes. New DM Type 2.  Lost 9 lbs.  She was told she didn't need to test blood sugars.  Eating three meals per day now. Has been taking Metformin on empty stomach at times instead of after meals and had GI upsets of bloating and gas and some loose stools.  Willing to change how she is taking her Metformin. She has Rheumatoid Arthritis and says she is having a flare up right now. Not able to exercise. Emotionally tearful today and says she has some issues going.    Diet is much improved. She is eating higher fiber foods, more fresh fruits and vegetables and less processed foods. Can't eat dinner til after 730 due to work schedule. Making improvements in diet.   Not able to exercise much right now due to RA.    Going to see Dr. Meda Coffee as a new patient soon.   On Losartan for BP. Nothing for cholesterol due to muscle aches and pains.  Lab Results  Component Value Date   HGBA1C 7.0 (H) 03/05/2016   Preferred Learning Style:   Auditory  Visual  Hands on  No preference indicated    Learning Readiness:    Ready  Change in progress   MEDICATIONS: see list    DIETARY INTAKE:   24-hr recall:  B ( AM): Oatmeal, or boiled egg and ww toast Snk ( AM):  L ( PM): PB sandwich on ww bread,water or unsweet tea Snk ( PM D ( PM): Grilled chicken, baked french fries, salads.  Snk ( PM): water,  Beverages: water,   Usual physical activity: ADL   Estimated energy needs: 1500 calories 170 g carbohydrates 112 g protein 42 g fat  Progress Towards Goal(s):  In progress.   Nutritional Diagnosis:  NB-1.1 Food and nutrition-related knowledge deficit As related to Diabetes.  As evidenced by A1C .    Intervention:  Nutrition and Diabetes education provided on My Plate, CHO counting, meal planning, portion sizes, timing of meals, avoiding snacks between meals unless having a low blood  sugar, target ranges for A1C and blood sugars, signs/symptoms and treatment of hyper/hypoglycemia, monitoring blood sugars, taking medications as prescribed, benefits of exercising 30 minutes per day and prevention of complications of DM.  Goals 1. Follow My Plate 2. Eat 30-45 grams of carbs per meal 3. Increase fresh fruits and low carb vegetables 4. Drink water 5. Cut out processed foods; chips, cookies and junk food. 6. Eat meals on time. 7. Take Metformin after breakfast and dinner. 8. Exercise 60 minutes 4 days per week.  Teaching Method Utilized:  Visual Auditory Hands on  Handouts given during visit include:  The Plate Method  Meal Plan Card  Diabetes Instructions   Barriers to learning/adherence to lifestyle change:  none  Demonstrated degree of understanding via:  Teach Back   Monitoring/Evaluation:  Dietary intake, exercise, meal planning,  and body weight in 3 month(s).

## 2016-05-21 NOTE — Telephone Encounter (Signed)
Pt says she is anxious and teary and going through rough patch and wants meds for her nerves, but not something to take all the time, she has talked with Janey Greaser and denies being suicidal or homicidal.She says she is not sleeping well, and has contacted Dr Meda Coffee for PCP, has lost 9 lbs and sees Dr Dorris Fetch.Will rx ativan as trial.

## 2016-05-28 ENCOUNTER — Ambulatory Visit (INDEPENDENT_AMBULATORY_CARE_PROVIDER_SITE_OTHER): Payer: BLUE CROSS/BLUE SHIELD | Admitting: Family Medicine

## 2016-05-28 ENCOUNTER — Encounter: Payer: Self-pay | Admitting: Family Medicine

## 2016-05-28 VITALS — BP 136/78 | HR 80 | Temp 98.7°F | Resp 16 | Ht 65.5 in | Wt 235.0 lb

## 2016-05-28 DIAGNOSIS — E785 Hyperlipidemia, unspecified: Secondary | ICD-10-CM

## 2016-05-28 DIAGNOSIS — I10 Essential (primary) hypertension: Secondary | ICD-10-CM | POA: Diagnosis not present

## 2016-05-28 DIAGNOSIS — M797 Fibromyalgia: Secondary | ICD-10-CM | POA: Diagnosis not present

## 2016-05-28 DIAGNOSIS — Z23 Encounter for immunization: Secondary | ICD-10-CM

## 2016-05-28 DIAGNOSIS — E1165 Type 2 diabetes mellitus with hyperglycemia: Secondary | ICD-10-CM

## 2016-05-28 DIAGNOSIS — IMO0001 Reserved for inherently not codable concepts without codable children: Secondary | ICD-10-CM

## 2016-05-28 MED ORDER — ALBUTEROL SULFATE HFA 108 (90 BASE) MCG/ACT IN AERS: 2.0000 | INHALATION_SPRAY | Freq: Four times a day (QID) | RESPIRATORY_TRACT | 0 refills | Status: DC | PRN

## 2016-05-28 NOTE — Progress Notes (Signed)
Chief Complaint  Patient presents with  . Establish Care   Here for new to establish Recently diagnosed as diabetic States she has not been attending to her health for years.  Turned 59 and now wants to update Has appt for colo Has PAP and pelvic and mammo Is not up to date with shots and is getting a prevnar and a flu shot today She has seen Dr Dorris Fetch and the nutrition educator.  Has lost 9 lbs.  Is trying harder with diet.  Is not getting exercise and this is discussed. Says she has rheumatoid arthritis and fibromyalgia.  joints that hurt:  Hips.  No hand joints or rheumatoid symptoms. 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 Fibromyalgia 05/28/2016  . Uncontrolled type 2 diabetes mellitus without complication, without long-term current use of insulin ([place]) 04/30/2016  . Hyperlipidemia 04/30/2016  . Essential hypertension, benign 04/30/2016  . Morbid obesity due to excess calories ([place]) 04/30/2016  . Recent onset of diabetes mellitus ([place]) 03/10/2016  . Anxiety 03/05/2016  . Body aches 03/05/2016  . 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 Vitiligo 03/05/2016    Outpatient Encounter Prescriptions as of 05/28/2016  Medication Sig  . diphenhydrAMINE (BENADRYL) 25 MG tablet Take 50 mg by mouth. Takes at bedtime every night.  Marland Kitchen ibuprofen (ADVIL,MOTRIN) 200 MG tablet Take 400 mg by mouth as needed.  Marland Kitchen LORazepam (ATIVAN) 0.5 MG tablet Take 1 every 12 hours prn anxiety  . losartan (COZAAR) 25 MG tablet Take 1 tablet (25 mg total) by mouth daily.  . metFORMIN (GLUCOPHAGE) 500 MG tablet Take 1 tablet (500 mg total) by mouth 2 (two) times daily with a meal.  . albuterol (PROVENTIL HFA;VENTOLIN HFA) 108 (90 Base) MCG/ACT inhaler Inhale 2 puffs into the lungs every 6 (six) hours as needed for wheezing or shortness of breath.  . cholecalciferol (VITAMIN D) 1000  units tablet Take 5,000 Units by mouth daily.   No facility-administered encounter medications on file as of 05/28/2016.     Past Medical History:  Diagnosis Date  . Anxiety 03/05/2016  . Arthritis    rheumatoid  . Asthma   . Body aches 03/05/2016  . Depression   . Fibromyalgia   . GERD (gastroesophageal reflux disease)   . Hyperlipidemia   . Hypertension   . Obesity   . Recent onset of diabetes mellitus ([place]) 03/10/2016   Referred to Dr Dorris Fetch and rx metformin 500 mg bid and zocor 20 mg 1 daily  . 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 Vitiligo 03/05/2016    Past Surgical History:  Procedure Laterality Date  . CHOLECYSTECTOMY    . DILATION AND CURETTAGE OF UTERUS    . GALLBLADDER SURGERY    . LEG SURGERY Left    broke leg  . TUBAL LIGATION      Social History   Social History  . Marital status: Divorced    Spouse name: N/A  . Number of children: 2  . Years of education: 13   Occupational History  . hairdresser     self   Social History Main Topics  . Smoking status: Current Every Day Smoker    Packs/day: 1.00    Years: 20.00    Types: Cigarettes  . Smokeless tobacco: Never Used  . Alcohol use Yes     Comment: occ  .  Drug use: No  . Sexual activity: Not Currently    Birth control/ protection: Surgical     Comment: tubal   Other Topics Concern  . Not on file   Social History Narrative   Lives at home with two children, son is 34, daughter is 74   Family History  Problem Relation Age of Onset  . Cancer Paternal Grandfather     prostate  . Diabetes Paternal Grandmother   . Emphysema Maternal Grandmother   . Cancer Maternal Grandmother     lung  . Alcohol abuse Maternal Grandfather   . Diabetes Father   . Hypertension Mother   . Diabetes Mother   . Crohn's disease Sister     Review of Systems  Constitutional: Positive for malaise/fatigue. Negative for chills, fever and weight loss.  HENT: Negative for congestion and hearing loss.   Eyes: Negative for  blurred vision and pain.  Respiratory: Positive for wheezing. Negative for cough and shortness of breath.        Rare wheeze if sick  Cardiovascular: Negative for chest pain and leg swelling.  Gastrointestinal: Negative for abdominal pain, constipation, diarrhea and heartburn.  Genitourinary: Negative for dysuria and frequency.  Musculoskeletal: Positive for back pain, myalgias and neck pain. Negative for falls and joint pain.       Fibro  Neurological: Negative for dizziness, seizures and headaches.  Psychiatric/Behavioral: Negative for depression. The patient is nervous/anxious. The patient does not have insomnia.     BP 136/78 (BP Location: Right Arm, Patient Position: Sitting, Cuff Size: Normal)   Pulse 80   Temp 98.7 F (37.1 C) (Oral)   Resp 16   Ht 5' 5.5" (1.664 m)   Wt 235 lb 0.6 oz (106.6 kg)   SpO2 98%   BMI 38.52 kg/m   Physical Exam  Constitutional: She is oriented to person, place, and time. She appears well-developed and well-nourished.  obese  HENT:  Head: Normocephalic and atraumatic.  Right Ear: External ear normal.  Left Ear: External ear normal.  Mouth/Throat: Oropharynx is clear and moist.  Eyes: Conjunctivae are normal. Pupils are equal, round, and reactive to light.  Neck: Normal range of motion. Neck supple. No thyromegaly present.  Cardiovascular: Normal rate, regular rhythm and normal heart sounds.   Pulmonary/Chest: Effort normal and breath sounds normal. No respiratory distress.  Abdominal: Soft. Bowel sounds are normal.  Musculoskeletal: Normal range of motion. She exhibits no edema.  Lymphadenopathy:    She has no cervical adenopathy.  Neurological: She is alert and oriented to person, place, and time. She has normal reflexes.  Gait normal.  No focal findings  Skin: Skin is warm and dry.  Psychiatric: She has a normal mood and affect. Her behavior is normal. Thought content normal.  pleasant and coopertive  Nursing note and vitals  reviewed.  ASSESSMENT/PLAN:  1. Fibromyalgia   2. Need for prophylactic vaccination and inoculation against influenza  - Flu Vaccine QUAD 36+ mos IM  3. Essential hypertension, benign controlled  4. Uncontrolled type 2 diabetes mellitus without complication, without long-term current use of insulin ([facility]) improving  5. Morbid obesity due to excess calories ([facility]) improving  6. Hyperlipidemia, unspecified hyperlipidemia type Noncompliant with statin.  Discussed alternate medicine/different dose   Patient Instructions  Continue to eat well Stay as active as you can manage Congratulations on your weight loss I will follow lab tests with Dr Dorris Fetch  Flu shot and Prevnar today  Due for tetanus booster  See me  in 3 months   Raylene Everts, MD

## 2016-05-28 NOTE — Patient Instructions (Signed)
Continue to eat well Stay as active as you can manage Congratulations on your weight loss I will follow lab tests with Dr Dorris Fetch  Flu shot and Prevnar today  Due for tetanus booster  See me in 3 months

## 2016-07-08 DIAGNOSIS — E1165 Type 2 diabetes mellitus with hyperglycemia: Secondary | ICD-10-CM | POA: Diagnosis not present

## 2016-07-09 LAB — COMPREHENSIVE METABOLIC PANEL
ALT: 40 IU/L — AB (ref 0–32)
AST: 36 IU/L (ref 0–40)
Albumin/Globulin Ratio: 1.2 (ref 1.2–2.2)
Albumin: 4.4 g/dL (ref 3.5–5.5)
Alkaline Phosphatase: 112 IU/L (ref 39–117)
BUN/Creatinine Ratio: 15 (ref 9–23)
BUN: 16 mg/dL (ref 6–24)
Bilirubin Total: 0.8 mg/dL (ref 0.0–1.2)
CALCIUM: 9.6 mg/dL (ref 8.7–10.2)
CO2: 22 mmol/L (ref 18–29)
CREATININE: 1.1 mg/dL — AB (ref 0.57–1.00)
Chloride: 97 mmol/L (ref 96–106)
GFR calc Af Amer: 67 mL/min/{1.73_m2} (ref >59)
GFR, EST NON AFRICAN AMERICAN: 58 mL/min/{1.73_m2} — AB (ref >59)
GLOBULIN, TOTAL: 3.6 g/dL (ref 1.5–4.5)
Glucose: 110 mg/dL — ABNORMAL HIGH (ref 65–99)
Potassium: 4.5 mmol/L (ref 3.5–5.2)
Sodium: 139 mmol/L (ref 134–144)
Total Protein: 8 g/dL (ref 6.0–8.5)

## 2016-07-09 LAB — HEMOGLOBIN A1C
ESTIMATED AVERAGE GLUCOSE: 134 mg/dL
Hgb A1c MFr Bld: 6.3 % — ABNORMAL HIGH (ref 4.8–5.6)

## 2016-07-15 ENCOUNTER — Telehealth (INDEPENDENT_AMBULATORY_CARE_PROVIDER_SITE_OTHER): Payer: Self-pay | Admitting: *Deleted

## 2016-07-15 ENCOUNTER — Encounter (INDEPENDENT_AMBULATORY_CARE_PROVIDER_SITE_OTHER): Payer: Self-pay | Admitting: *Deleted

## 2016-07-15 MED ORDER — SOD PHOS MONO-SOD PHOS DIBASIC 1.102-0.398 G PO TABS: 32.0000 | ORAL_TABLET | Freq: Once | ORAL | 0 refills | Status: AC

## 2016-07-15 NOTE — Telephone Encounter (Signed)
Patient needs osmo pill 

## 2016-07-16 ENCOUNTER — Encounter: Payer: Self-pay | Admitting: "Endocrinology

## 2016-07-16 ENCOUNTER — Other Ambulatory Visit: Payer: Self-pay | Admitting: "Endocrinology

## 2016-07-16 ENCOUNTER — Encounter: Payer: BLUE CROSS/BLUE SHIELD | Attending: "Endocrinology | Admitting: Nutrition

## 2016-07-16 ENCOUNTER — Ambulatory Visit (INDEPENDENT_AMBULATORY_CARE_PROVIDER_SITE_OTHER): Payer: BLUE CROSS/BLUE SHIELD | Admitting: "Endocrinology

## 2016-07-16 VITALS — BP 130/88 | HR 87 | Ht 65.5 in | Wt 237.0 lb

## 2016-07-16 DIAGNOSIS — I10 Essential (primary) hypertension: Secondary | ICD-10-CM

## 2016-07-16 DIAGNOSIS — E1165 Type 2 diabetes mellitus with hyperglycemia: Secondary | ICD-10-CM | POA: Insufficient documentation

## 2016-07-16 DIAGNOSIS — IMO0002 Reserved for concepts with insufficient information to code with codable children: Secondary | ICD-10-CM

## 2016-07-16 DIAGNOSIS — Z713 Dietary counseling and surveillance: Secondary | ICD-10-CM | POA: Insufficient documentation

## 2016-07-16 DIAGNOSIS — E782 Mixed hyperlipidemia: Secondary | ICD-10-CM | POA: Diagnosis not present

## 2016-07-16 DIAGNOSIS — E1122 Type 2 diabetes mellitus with diabetic chronic kidney disease: Secondary | ICD-10-CM

## 2016-07-16 DIAGNOSIS — N182 Chronic kidney disease, stage 2 (mild): Secondary | ICD-10-CM | POA: Diagnosis not present

## 2016-07-16 DIAGNOSIS — E119 Type 2 diabetes mellitus without complications: Secondary | ICD-10-CM | POA: Diagnosis not present

## 2016-07-16 LAB — HM DIABETES EYE EXAM

## 2016-07-16 MED ORDER — VITAMIN D3 125 MCG (5000 UT) PO CAPS: 5000.0000 [IU] | ORAL_CAPSULE | Freq: Every day | ORAL | 0 refills | Status: DC

## 2016-07-16 NOTE — Progress Notes (Signed)
  Medical Nutrition Therapy:  Appt start time: 1000 nd time:  1030  Assessment:  Primary concerns today:  Diabetes. New DM Type 2.   Gained 2 lbs.Frustrated she gained weight.However, her A1C came down from 7%  To 6.3%. She feels better eating more fresh fruits, vegetables and getting away from processed foods. Her arthritic/RA pain is better. Saw Dr. Meda Coffee and liked her. Obesity BMI 38   She isn't testing blood sugars.  Still taking Metformin 500 mg BID. Diet needs more fresh fruits, and low carb vegetables with meals.  Lab Results  Component Value Date   HGBA1C 6.3 (H) 07/08/2016   Preferred Learning Style:     No preference indicated     Change in progress   MEDICATIONS: see list    DIETARY INTAKE:   24-hr recall:  B ( AM):  PB and rice cake  Snk ( AM):  L ( PM): PB sandwich on ww bread,water or unsweet tea Snk ( PM D ( PM): Grilled chicken, baked french fries, salads.  Snk ( PM): water,  Beverages: water,   Usual physical activity: ADL   Estimated energy needs: 1500 calories 170 g carbohydrates 112 g protein 42 g fat  Progress Towards Goal(s):  In progress.   Nutritional Diagnosis:  NB-1.1 Food and nutrition-related knowledge deficit As related to Diabetes.  As evidenced by A1C .    Intervention:  Nutrition and Diabetes education provided on My Plate, CHO counting, meal planning, portion sizes, timing of meals, avoiding snacks between meals unless having a low blood sugar, target ranges for A1C and blood sugars, signs/symptoms and treatment of hyper/hypoglycemia, monitoring blood sugars, taking medications as prescribed, benefits of exercising 30 minutes per day and prevention of complications of DM.  Goals 1. Follow My Plate 2. Eat 30-45 grams of carbs per meal 3. Increase fresh fruits and low carb vegetables 4. Drink water 5. Cut out processed foods; chips, cookies and junk food. 6. Eat meals on time. 7. Take Metformin after breakfast and dinner. 8.  Exercise 60 minutes 4 days per week.  Teaching Method Utilized:  Visual Auditory Hands on  Handouts given during visit include:  The Plate Method  Meal Plan Card  Diabetes Instructions   Barriers to learning/adherence to lifestyle change:  none  Demonstrated degree of understanding via:  Teach Back   Monitoring/Evaluation:  Dietary intake, exercise, meal planning,  and body weight in 3 month(s).

## 2016-07-16 NOTE — Patient Instructions (Signed)
Goals

## 2016-07-16 NOTE — Progress Notes (Signed)
Subjective:    Patient ID: Erica Graham, female    DOB: 15-Apr-1966. Patient is being seen in consultation for management of diabetes requested by  Jonnie Kind, MD  Past Medical History:  Diagnosis Date  . Anxiety 03/05/2016  . Arthritis    rheumatoid  . Asthma   . Body aches 03/05/2016  . Depression   . Fibromyalgia   . GERD (gastroesophageal reflux disease)   . Hyperlipidemia   . Hypertension   . Obesity   . Recent onset of diabetes mellitus ([place]) 03/10/2016   Referred to Dr Dorris Fetch and rx metformin 500 mg bid and zocor 20 mg 1 daily  . 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 Vitiligo 03/05/2016   Past Surgical History:  Procedure Laterality Date  . CHOLECYSTECTOMY    . DILATION AND CURETTAGE OF UTERUS    . GALLBLADDER SURGERY    . LEG SURGERY Left    broke leg  . TUBAL LIGATION     Social History   Social History  . Marital status: Divorced    Spouse name: N/A  . Number of children: 2  . Years of education: 13   Occupational History  . hairdresser     self   Social History Main Topics  . Smoking status: Current Every Day Smoker    Packs/day: 1.00    Years: 20.00    Types: Cigarettes  . Smokeless tobacco: Never Used  . Alcohol use Yes     Comment: occ  . Drug use: No  . Sexual activity: Not Currently    Birth control/ protection: Surgical     Comment: tubal   Other Topics Concern  . None   Social History Narrative   Lives at home with two children, son is 43, daughter is 74   Outpatient Encounter Prescriptions as of 07/16/2016  Medication Sig  . albuterol (PROVENTIL HFA;VENTOLIN HFA) 108 (90 Base) MCG/ACT inhaler Inhale 2 puffs into the lungs every 6 (six) hours as needed for wheezing or shortness of breath.  . cholecalciferol (VITAMIN D) 1000 units tablet Take 5,000 Units by mouth daily.  . diphenhydrAMINE (BENADRYL) 25 MG tablet Take 50 mg by mouth. Takes at bedtime every night.  Marland Kitchen ibuprofen (ADVIL,MOTRIN) 200 MG tablet Take 400 mg by mouth as  needed.  Marland Kitchen LORazepam (ATIVAN) 0.5 MG tablet Take 1 every 12 hours prn anxiety  . losartan (COZAAR) 25 MG tablet Take 1 tablet (25 mg total) by mouth daily.  . metFORMIN (GLUCOPHAGE) 500 MG tablet Take 1 tablet (500 mg total) by mouth 2 (two) times daily with a meal.   No facility-administered encounter medications on file as of 07/16/2016.    ALLERGIES: No Known Allergies VACCINATION STATUS: Immunization History  Administered Date(s) Administered  . Influenza,inj,Quad PF,36+ Mos 05/28/2016  . Pneumococcal Conjugate-13 05/28/2016    Diabetes  She presents for her initial diabetic visit. She has type 2 diabetes mellitus. Onset time: She was diagnosed at approximate age of 7 years. Her disease course has been improving. There are no hypoglycemic associated symptoms. Pertinent negatives for hypoglycemia include no confusion, headaches, pallor or seizures. There are no diabetic associated symptoms. Pertinent negatives for diabetes include no chest pain and no polyphagia. There are no hypoglycemic complications. Symptoms are improving. Diabetic complications include nephropathy. Risk factors for coronary artery disease include diabetes mellitus, dyslipidemia, hypertension, obesity, tobacco exposure and sedentary lifestyle. Current diabetic treatment includes oral agent (monotherapy). Her weight is decreasing steadily. She is following a generally  unhealthy diet. When asked about meal planning, she reported none. She has had a previous visit with a dietitian (She will see the dietitian today.). She never participates in exercise. An ACE inhibitor/angiotensin II receptor blocker is not being taken (She mentions adverse effects from lisinopril.).  Hyperlipidemia  This is a chronic problem. The current episode started more than 1 year ago. The problem is uncontrolled. Exacerbating diseases include diabetes and obesity. Associated symptoms include myalgias and shortness of breath. Pertinent negatives include  no chest pain. She is currently on no antihyperlipidemic treatment. The current treatment provides no improvement of lipids. Risk factors for coronary artery disease include diabetes mellitus, dyslipidemia, hypertension, obesity and a sedentary lifestyle.  Hypertension  This is a chronic problem. The current episode started more than 1 year ago. The problem is uncontrolled. Associated symptoms include shortness of breath. Pertinent negatives include no chest pain, headaches or palpitations. Risk factors for coronary artery disease include dyslipidemia, diabetes mellitus, family history, obesity, smoking/tobacco exposure and sedentary lifestyle. Past treatments include nothing. The current treatment provides no improvement. Hypertensive end-organ damage includes kidney disease.       Review of Systems  Constitutional: Negative for activity change, chills, fever and unexpected weight change.  HENT: Negative for trouble swallowing and voice change.   Eyes: Negative for visual disturbance.  Respiratory: Positive for shortness of breath. Negative for cough and wheezing.   Cardiovascular: Negative for chest pain, palpitations and leg swelling.  Gastrointestinal: Negative for diarrhea, nausea and vomiting.  Endocrine: Negative for cold intolerance, heat intolerance and polyphagia.  Genitourinary: Positive for frequency. Negative for dysuria and flank pain.  Musculoskeletal: Positive for myalgias. Negative for arthralgias.  Skin: Negative for color change, pallor, rash and wound.  Neurological: Negative for seizures and headaches.  Psychiatric/Behavioral: Negative for confusion and suicidal ideas.    Objective:    BP 130/88   Pulse 87   Ht 5' 5.5" (1.664 m)   Wt 237 lb (107.5 kg)   BMI 38.84 kg/m   Wt Readings from Last 3 Encounters:  07/16/16 237 lb (107.5 kg)  05/28/16 235 lb 0.6 oz (106.6 kg)  05/21/16 232 lb (105.2 kg)    Physical Exam  Constitutional: She is oriented to person,  place, and time. She appears well-developed.  HENT:  Head: Normocephalic and atraumatic.  Eyes: EOM are normal.  Neck: Normal range of motion. Neck supple. No tracheal deviation present. No thyromegaly present.  Cardiovascular: Normal rate and regular rhythm.   Pulmonary/Chest: Effort normal and breath sounds normal.  Abdominal: Soft. Bowel sounds are normal. There is no tenderness. There is no guarding.  Musculoskeletal: Normal range of motion. She exhibits no edema.  Neurological: She is alert and oriented to person, place, and time. She has normal reflexes. No cranial nerve deficit. Coordination normal.  Skin: Skin is warm and dry. No rash noted. No erythema. No pallor.  Psychiatric: She has a normal mood and affect. Judgment normal.  Reluctant affect.     CMP     Component Value Date/Time   NA 139 07/08/2016 0903   K 4.5 07/08/2016 0903   CL 97 07/08/2016 0903   CO2 22 07/08/2016 0903   GLUCOSE 110 (H) 07/08/2016 0903   BUN 16 07/08/2016 0903   CREATININE 1.10 (H) 07/08/2016 0903   CALCIUM 9.6 07/08/2016 0903   PROT 8.0 07/08/2016 0903   ALBUMIN 4.4 07/08/2016 0903   AST 36 07/08/2016 0903   ALT 40 (H) 07/08/2016 0903   ALKPHOS 112 07/08/2016  0903   BILITOT 0.8 07/08/2016 0903   GFRNONAA 58 (L) 07/08/2016 0903   GFRAA 67 07/08/2016 0903     Diabetic Labs (most recent): Lab Results  Component Value Date   HGBA1C 6.3 (H) 07/08/2016   HGBA1C 7.0 (H) 03/05/2016     Lipid Panel ( most recent) Lipid Panel     Component Value Date/Time   CHOL 188 03/05/2016 1127   TRIG 133 03/05/2016 1127   HDL 35 (L) 03/05/2016 1127   CHOLHDL 5.4 (H) 03/05/2016 1127   LDLCALC 126 (H) 03/05/2016 1127     Assessment & Plan:   1. Uncontrolled type 2 diabetes mellitus without complication, without long-term current use of insulin ([place])   - Patient has currently uncontrolled symptomatic type 2 DM since  50 years of age,  with most recent A1c of  6.3% improving from 7 %. Recent  labs reviewed, showing improving renal function.  Her diabetes is complicated by chronic kidney disease, hypertension with intolerance to ACE inhibitors, hyperlipidemia with intolerance to statins, obesity, chronic heavy smoking and patient remains at a high risk for more acute and chronic complications of diabetes which include CAD, CVA, CKD, retinopathy, and neuropathy. These are all discussed in detail with the patient.  - I have counseled the patient on diet management and weight loss, by adopting a carbohydrate restricted/protein rich diet.  - Suggestion is made for patient to avoid simple carbohydrates   from their diet including Cakes , Desserts, Ice Cream,  Soda (  diet and regular) , Sweet Tea , Candies,  Chips, Cookies, Artificial Sweeteners,   and "Sugar-free" Products . This will help patient to have stable blood glucose profile and potentially avoid unintended weight gain.  - I encouraged the patient to switch to  unprocessed or minimally processed complex starch and increased protein intake (animal or plant source), fruits, and vegetables.  - Patient is advised to stick to a routine mealtimes to eat 3 meals  a day and avoid unnecessary snacks ( to snack only to correct hypoglycemia).  - The patient will be scheduled with Jearld Fenton, RDN, CDE for individualized DM education.  - I have approached patient with the following individualized plan to manage diabetes and patient agrees:   - For her type 2 diabetes which is relatively recent onset she would not need any more therapy than metformin. - Due to abnormal renal function currently, I like to keep the dose lower at 500 mg by mouth twice a day.  -Patient is not a candidate for  SGLT2 i due to CKD.  - Patient will be considered for incretin therapy if necessary next visit.  - Patient specific target  A1c;  LDL, HDL, Triglycerides, and  Waist Circumference were discussed in detail.  2) BP/HTN:  Controlled. She has tolerated the  losartan 25 mg daily which was prescribed for her last visit. - Unfortunately she continues to smoke heavily. Even more important for her is  To quite smoking, which is emphasized heavily during the entire visit. 3) Lipids/HPL:  Uncontrolled with LDL 126.   She mentions intolerance to simvastatin. On subsequent visit she'll be tried on low-dose Crestor. 4)  Weight/Diet:  she is obese, CDE Consult has been initiated , exercise, and detailed carbohydrates information provided.  5) Chronic Care/Health Maintenance:  -Patient is started on ARB and  will be considered for Statin medications and encouraged to continue to follow up with Ophthalmology, Podiatrist at least yearly or according to recommendations, and advised to  Quit smoking. I have recommended yearly flu vaccine and pneumonia vaccination at least every 5 years; moderate intensity exercise for up to 150 minutes weekly; and  sleep for at least 7 hours a day.  - 60 minutes of time was spent on the care of this patient , 50% of which was applied for counseling on diabetes complications and their preventions.  - Patient to bring meter and  blood glucose logs during their next visit.   - I advised patient to maintain close follow up with Jonnie Kind, MD for primary care needs.  Follow up plan: - Return in 10 weeks with repeat labs for reevaluation.  Glade Lloyd, MD Phone: [phone]  Fax: [phone]   07/16/2016, 9:59 AM

## 2016-07-16 NOTE — Patient Instructions (Signed)

## 2016-07-20 ENCOUNTER — Telehealth (INDEPENDENT_AMBULATORY_CARE_PROVIDER_SITE_OTHER): Payer: Self-pay | Admitting: *Deleted

## 2016-07-20 ENCOUNTER — Encounter (INDEPENDENT_AMBULATORY_CARE_PROVIDER_SITE_OTHER): Payer: Self-pay | Admitting: *Deleted

## 2016-07-20 NOTE — Telephone Encounter (Signed)
Referring MD: Anderson Malta griffin PCP: nelson   Procedure: tcs  Reason/Indication:  screening  Has patient had this procedure before?  no  If so, when, by whom and where?    Is there a family history of colon cancer?  no  Who?  What age when diagnosed?    Is patient diabetic?   yes      Does patient have prosthetic heart valve or mechanical valve?  no  Do you have a pacemaker?  no  Has patient ever had endocarditis? no  Has patient had joint replacement within last 12 months?  no  Does patient tend to be constipated or take laxatives? no  Does patient have a history of alcohol/drug use?  no  Is patient on Coumadin, Plavix and/or Aspirin? no  Medications: metformin 500 mg bid, losartan 25 mg daily  Allergies: nkda  Medication Adjustment: hold DM med evening before & morning of  Procedure date & time: 08/20/16 at 830

## 2016-07-21 NOTE — Telephone Encounter (Signed)
agree

## 2016-08-19 ENCOUNTER — Encounter (HOSPITAL_COMMUNITY): Payer: Self-pay | Admitting: *Deleted

## 2016-08-20 ENCOUNTER — Encounter (HOSPITAL_COMMUNITY)
Admission: RE | Disposition: A | Discharge status: Home / Self Care | Payer: Self-pay | Source: Ambulatory Visit | Attending: Internal Medicine

## 2016-08-20 ENCOUNTER — Ambulatory Visit (HOSPITAL_COMMUNITY)
Admission: RE | Admit: 2016-08-20 | Discharge: 2016-08-20 | Disposition: A | Discharge status: Home / Self Care | Payer: BLUE CROSS/BLUE SHIELD | Source: Ambulatory Visit | Attending: Internal Medicine | Admitting: Internal Medicine

## 2016-08-20 ENCOUNTER — Encounter (HOSPITAL_COMMUNITY): Payer: Self-pay | Admitting: *Deleted

## 2016-08-20 DIAGNOSIS — F419 Anxiety disorder, unspecified: Secondary | ICD-10-CM | POA: Insufficient documentation

## 2016-08-20 DIAGNOSIS — D122 Benign neoplasm of ascending colon: Secondary | ICD-10-CM | POA: Insufficient documentation

## 2016-08-20 DIAGNOSIS — D120 Benign neoplasm of cecum: Secondary | ICD-10-CM | POA: Diagnosis not present

## 2016-08-20 DIAGNOSIS — N182 Chronic kidney disease, stage 2 (mild): Secondary | ICD-10-CM | POA: Insufficient documentation

## 2016-08-20 DIAGNOSIS — Z79899 Other long term (current) drug therapy: Secondary | ICD-10-CM | POA: Insufficient documentation

## 2016-08-20 DIAGNOSIS — M1990 Unspecified osteoarthritis, unspecified site: Secondary | ICD-10-CM | POA: Insufficient documentation

## 2016-08-20 DIAGNOSIS — Z7984 Long term (current) use of oral hypoglycemic drugs: Secondary | ICD-10-CM | POA: Insufficient documentation

## 2016-08-20 DIAGNOSIS — M797 Fibromyalgia: Secondary | ICD-10-CM | POA: Diagnosis not present

## 2016-08-20 DIAGNOSIS — Z1211 Encounter for screening for malignant neoplasm of colon: Secondary | ICD-10-CM | POA: Insufficient documentation

## 2016-08-20 DIAGNOSIS — J45909 Unspecified asthma, uncomplicated: Secondary | ICD-10-CM | POA: Diagnosis not present

## 2016-08-20 DIAGNOSIS — E785 Hyperlipidemia, unspecified: Secondary | ICD-10-CM | POA: Diagnosis not present

## 2016-08-20 DIAGNOSIS — F17210 Nicotine dependence, cigarettes, uncomplicated: Secondary | ICD-10-CM | POA: Insufficient documentation

## 2016-08-20 DIAGNOSIS — K644 Residual hemorrhoidal skin tags: Secondary | ICD-10-CM | POA: Insufficient documentation

## 2016-08-20 DIAGNOSIS — I129 Hypertensive chronic kidney disease with stage 1 through stage 4 chronic kidney disease, or unspecified chronic kidney disease: Secondary | ICD-10-CM | POA: Insufficient documentation

## 2016-08-20 DIAGNOSIS — F329 Major depressive disorder, single episode, unspecified: Secondary | ICD-10-CM | POA: Insufficient documentation

## 2016-08-20 DIAGNOSIS — K219 Gastro-esophageal reflux disease without esophagitis: Secondary | ICD-10-CM | POA: Diagnosis not present

## 2016-08-20 DIAGNOSIS — E1122 Type 2 diabetes mellitus with diabetic chronic kidney disease: Secondary | ICD-10-CM | POA: Insufficient documentation

## 2016-08-20 DIAGNOSIS — E669 Obesity, unspecified: Secondary | ICD-10-CM | POA: Insufficient documentation

## 2016-08-20 DIAGNOSIS — D123 Benign neoplasm of transverse colon: Secondary | ICD-10-CM | POA: Diagnosis not present

## 2016-08-20 HISTORY — PX: COLONOSCOPY: SHX5424

## 2016-08-20 HISTORY — PX: POLYPECTOMY: SHX5525

## 2016-08-20 LAB — GLUCOSE, CAPILLARY: GLUCOSE-CAPILLARY: 134 mg/dL — AB (ref 65–99)

## 2016-08-20 SURGERY — COLONOSCOPY
Anesthesia: Moderate Sedation

## 2016-08-20 MED ORDER — MIDAZOLAM HCL 5 MG/5ML IJ SOLN
INTRAMUSCULAR | Status: AC
  Filled 2016-08-20: qty 5

## 2016-08-20 MED ORDER — SODIUM CHLORIDE 0.9% FLUSH
INTRAVENOUS | Status: AC
  Filled 2016-08-20: qty 10

## 2016-08-20 MED ORDER — STERILE WATER FOR IRRIGATION IR SOLN
Status: DC | PRN
  Administered 2016-08-20: 08:00:00

## 2016-08-20 MED ORDER — SODIUM CHLORIDE 0.9 % IV SOLN
INTRAVENOUS | Status: DC
  Administered 2016-08-20: 1000 mL via INTRAVENOUS

## 2016-08-20 MED ORDER — MIDAZOLAM HCL 5 MG/5ML IJ SOLN
INTRAMUSCULAR | Status: AC
  Filled 2016-08-20: qty 10

## 2016-08-20 MED ORDER — MIDAZOLAM HCL 5 MG/5ML IJ SOLN
INTRAMUSCULAR | Status: DC | PRN
  Administered 2016-08-20 (×2): 2 mg via INTRAVENOUS
  Administered 2016-08-20: 1 mg via INTRAVENOUS
  Administered 2016-08-20: 2 mg via INTRAVENOUS
  Administered 2016-08-20: 3 mg via INTRAVENOUS
  Administered 2016-08-20: 2 mg via INTRAVENOUS

## 2016-08-20 MED ORDER — PROMETHAZINE HCL 25 MG/ML IJ SOLN
INTRAMUSCULAR | Status: DC | PRN
  Administered 2016-08-20: 6.25 mg via INTRAVENOUS

## 2016-08-20 MED ORDER — MEPERIDINE HCL 50 MG/ML IJ SOLN
INTRAMUSCULAR | Status: AC
  Filled 2016-08-20: qty 1

## 2016-08-20 MED ORDER — PROMETHAZINE HCL 25 MG/ML IJ SOLN
INTRAMUSCULAR | Status: AC
  Filled 2016-08-20: qty 1

## 2016-08-20 MED ORDER — MEPERIDINE HCL 50 MG/ML IJ SOLN
INTRAMUSCULAR | Status: DC | PRN
  Administered 2016-08-20 (×2): 25 mg via INTRAVENOUS

## 2016-08-20 NOTE — Discharge Instructions (Signed)
Resume usual medications and diet. No driving for 24 hours. Physician will call with biopsy results.   Colonoscopy, Adult, Care After This sheet gives you information about how to care for yourself after your procedure. Your health care provider may also give you more specific instructions. If you have problems or questions, contact your health care provider. What can I expect after the procedure? After the procedure, it is common to have:  A small amount of blood in your stool for 24 hours after the procedure.  Some gas.  Mild abdominal cramping or bloating. Follow these instructions at home: General instructions  For the first 24 hours after the procedure:  Do not drive or use machinery.  Do not sign important documents.  Do not drink alcohol.  Do your regular daily activities at a slower pace than normal.  Eat soft, easy-to-digest foods.  Rest often.  Take over-the-counter or prescription medicines only as told by your health care provider.  It is up to you to get the results of your procedure. Ask your health care provider, or the department performing the procedure, when your results will be ready. Relieving cramping and bloating  Try walking around when you have cramps or feel bloated.  Apply heat to your abdomen as told by your health care provider. Use a heat source that your health care provider recommends, such as a moist heat pack or a heating pad.  Place a towel between your skin and the heat source.  Leave the heat on for 20-30 minutes.  Remove the heat if your skin turns bright red. This is especially important if you are unable to feel pain, heat, or cold. You may have a greater risk of getting burned. Eating and drinking  Drink enough fluid to keep your urine clear or pale yellow.  Resume your normal diet as instructed by your health care provider. Avoid heavy or fried foods that are hard to digest.  Avoid drinking alcohol for as long as instructed  by your health care provider. Contact a health care provider if:  You have blood in your stool 2-3 days after the procedure. Get help right away if:  You have more than a small spotting of blood in your stool.  You pass large blood clots in your stool.  Your abdomen is swollen.  You have nausea or vomiting.  You have a fever.  You have increasing abdominal pain that is not relieved with medicine. This information is not intended to replace advice given to you by your health care provider. Make sure you discuss any questions you have with your health care provider. Document Released: 03/10/2004 Document Revised: 04/20/2016 Document Reviewed: 10/08/2015 Elsevier Interactive Patient Education  2017 [employer]  PATIENT INSTRUCTIONS POST-ANESTHESIA  IMMEDIATELY FOLLOWING SURGERY:  Do not drive or operate machinery for the first twenty four hours after surgery.  Do not make any important decisions for twenty four hours after surgery or while taking narcotic pain medications or sedatives.  If you develop intractable nausea and vomiting or a severe headache please notify your doctor immediately.  FOLLOW-UP:  Please make an appointment with your surgeon as instructed. You do not need to follow up with anesthesia unless specifically instructed to do so.  WOUND CARE INSTRUCTIONS (if applicable):  Keep a dry clean dressing on the anesthesia/puncture wound site if there is drainage.  Once the wound has quit draining you may leave it open to air.  Generally you should leave the bandage intact for twenty four  hours unless there is drainage.  If the epidural site drains for more than 36-48 hours please call the anesthesia department.  QUESTIONS?:  Please feel free to call your physician or the hospital operator if you have any questions, and they will be happy to assist you.

## 2016-08-20 NOTE — H&P (Signed)
Erica Graham is an 51 y.o. female.   Chief Complaint: Patient is here for colonoscopy. HPI: Patient is 51 year old Caucasian female who is here for screening colonoscopy. She denies abdominal pain change in bowel habits or rectal bleeding. Family History is negative for CRC.  Past Medical History:  Diagnosis Date  . Anxiety 03/05/2016  . Arthritis    rheumatoid  . Asthma     03/05/2016  . Depression   . Fibromyalgia   . GERD (gastroesophageal reflux disease)   . Hyperlipidemia   . Hypertension   . Obesity   . Recent onset of diabetes mellitus ([place]) 03/10/2016     . Uncontrolled type 2 diabetes mellitus with stage 2 chronic kidney disease ([place])   . 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 Vitiligo 03/05/2016    Past Surgical History:  Procedure Laterality Date  . CHOLECYSTECTOMY    . DILATION AND CURETTAGE OF UTERUS    . GALLBLADDER SURGERY    . LEG SURGERY Left    broke leg  . TUBAL LIGATION      Family History  Problem Relation Age of Onset  . Cancer Paternal Grandfather     prostate  . Diabetes Paternal Grandmother   . Emphysema Maternal Grandmother   . Cancer Maternal Grandmother     lung  . Alcohol abuse Maternal Grandfather   . Diabetes Father   . Hypertension Mother   . Diabetes Mother   . Crohn's disease Sister    Social History:  reports that she has been smoking Cigarettes.  She has a 20.00 pack-year smoking history. She has never used smokeless tobacco. She reports that she drinks alcohol. She reports that she does not use drugs.  Allergies: No Known Allergies  Medications Prior to Admission  Medication Sig Dispense Refill  . Cholecalciferol (VITAMIN D3) 5000 units CAPS Take 1 capsule (5,000 Units total) by mouth daily. 90 capsule 0  . ibuprofen (ADVIL,MOTRIN) 200 MG tablet Take 400 mg by mouth as needed for headache or moderate pain.     Marland Kitchen losartan (COZAAR) 25 MG tablet Take 1 tablet (25 mg total) by mouth daily. 30 tablet 2  . metFORMIN (GLUCOPHAGE) 500  MG tablet Take 1 tablet (500 mg total) by mouth 2 (two) times daily with a meal. 60 tablet 2  . OVER THE COUNTER MEDICATION Take 1 tablet by mouth daily. EHT supplement    . Polyethyl Glycol-Propyl Glycol (SYSTANE OP) Apply 1 drop to eye 2 (two) times daily.    Marland Kitchen albuterol (PROVENTIL HFA;VENTOLIN HFA) 108 (90 Base) MCG/ACT inhaler Inhale 2 puffs into the lungs every 6 (six) hours as needed for wheezing or shortness of breath. 1 Inhaler 0  . diphenhydrAMINE (BENADRYL) 25 MG tablet Take 50 mg by mouth at bedtime as needed for sleep.     Marland Kitchen LORazepam (ATIVAN) 0.5 MG tablet Take 1 every 12 hours prn anxiety (Patient not taking: Reported on 08/14/2016) 20 tablet 0    Results for orders placed or performed during the hospital encounter of 08/20/16 (from the past 48 hour(s))  Glucose, capillary     Status: Abnormal   Collection Time: 08/20/16  7:42 AM  Result Value Ref Range   Glucose-Capillary 134 (H) 65 - 99 mg/dL   No results found.  ROS  Blood pressure (!) 146/78, pulse 80, temperature 98.4 F (36.9 C), temperature source Oral, resp. rate 13, height 5\' 6"  (1.676 m), weight 235 lb (106.6 kg), SpO2 98 %. Physical Exam  Constitutional: She  appears well-developed and well-nourished.  HENT:  Mouth/Throat: Oropharynx is clear and moist.  Eyes: Conjunctivae are normal. No scleral icterus.  Neck: No thyromegaly present.  Cardiovascular: Normal rate, regular rhythm and normal heart sounds.   No murmur heard. Respiratory: Effort normal and breath sounds normal.  GI: Soft. She exhibits no distension and no mass. There is no tenderness.  Musculoskeletal: She exhibits no edema.  Lymphadenopathy:    She has no cervical adenopathy.  Neurological: She is alert.  Skin: Skin is warm and dry.     Assessment/Plan Average risk screening colonoscopy.  Hildred Laser, MD 08/20/2016, 8:19 AM

## 2016-08-20 NOTE — Op Note (Signed)
[facility] [name] Procedure Date: 08/20/2016 8:18 AM MRN: [id number] Date of Birth: [birth year] Attending MD: Hildred Laser , MD CSN: [id number] Age: 51 Admit Type: Outpatient Procedure:                Colonoscopy Indications:              Screening for colorectal malignant neoplasm Providers:                Hildred Laser, MD, [place]is Peak B. Sharon Seller, RN, Isabella Stalling, Technician Referring MD:             Raylene Everts, MD Medicines:                Promethazine 6.25 mg IV, Meperidine 50 mg IV,                            Midazolam 12 mg IV Complications:            No immediate complications. Estimated Blood Loss:     Estimated blood loss was minimal. Procedure:                Pre-Anesthesia Assessment:                           - Prior to the procedure, a History and Physical                            was performed, and patient medications and                            allergies were reviewed. The patient's tolerance of                            previous anesthesia was also reviewed. The risks                            and benefits of the procedure and the sedation                            options and risks were discussed with the patient.                            All questions were answered, and informed consent                            was obtained. Prior Anticoagulants: The patient has                            taken no previous anticoagulant or antiplatelet                            agents. ASA Grade Assessment: II - A patient with  mild systemic disease. After reviewing the risks                            and benefits, the patient was deemed in                            satisfactory condition to undergo the procedure.                           After obtaining informed consent, the colonoscope                            was passed under direct vision. Throughout the                             procedure, the patient's blood pressure, pulse, and                            oxygen saturations were monitored continuously. The                            [id number]) scope was introduced through                            the anus and advanced to the the cecum, identified                            by appendiceal orifice and ileocecal valve. The                            colonoscopy was performed without difficulty. The                            patient tolerated the procedure well. The quality                            of the bowel preparation was good. The ileocecal                            valve, appendiceal orifice, and rectum were                            photographed. Scope In: 8:35:42 AM Scope Out: 9:05:23 AM Scope Withdrawal Time: 0 hours 12 minutes 0 seconds  Total Procedure Duration: 0 hours 29 minutes 41 seconds  Findings:      The perianal and digital rectal examinations were normal.      Three sessile and semi-sessile polyps were found in the transverse       colon, hepatic flexure and cecum. The polyps were 4 to 7 mm in size.       These polyps were removed with a cold snare. Resection and retrieval       were complete. The pathology specimen was placed into Bottle Number 1.      The exam was otherwise normal throughout the examined colon.  External hemorrhoids were found during retroflexion. The hemorrhoids       were small. Impression:               - Three 4 to 7 mm polyps in the transverse colon,                            at the hepatic flexure and in the cecum, removed                            with a cold snare. Resected and retrieved.                           - External hemorrhoids. Moderate Sedation:      Moderate (conscious) sedation was administered by the endoscopy nurse       and supervised by the endoscopist. The following parameters were       monitored: oxygen saturation, heart rate, blood pressure, CO2       capnography  and response to care. Total physician intraservice time was       40 minutes. Recommendation:           - Patient has a contact number available for                            emergencies. The signs and symptoms of potential                            delayed complications were discussed with the                            patient. Return to normal activities tomorrow.                            Written discharge instructions were provided to the                            patient.                           - Resume previous diet today.                           - Continue present medications.                           - Await pathology results.                           - Repeat colonoscopy for surveillance based on                            pathology results. Procedure Code(s):        --- Professional ---                           [id number], Colonoscopy, flexible; with removal of  tumor(s), polyp(s), or other lesion(s) by snare                            technique                           99152, Moderate sedation services provided by the                            same physician or other qualified health care                            professional performing the diagnostic or                            therapeutic service that the sedation supports,                            requiring the presence of an independent trained                            observer to assist in the monitoring of the                            patient's level of consciousness and physiological                            status; initial 15 minutes of intraservice time,                            patient age 38 years or older                           [phone], Moderate sedation services; each additional                            15 minutes intraservice time                           99153, Moderate sedation services; each additional                            15 minutes intraservice  time Diagnosis Code(s):        --- Professional ---                           Z12.11, Encounter for screening for malignant                            neoplasm of colon                           D12.3, Benign neoplasm of transverse colon (hepatic                            flexure or splenic  flexure)                           D12.0, Benign neoplasm of cecum                           K64.4, Residual hemorrhoidal skin tags CPT copyright 2016 [employer]. All rights reserved. The codes documented in this report are preliminary and upon coder review may  be revised to meet current compliance requirements. Hildred Laser, MD Hildred Laser, MD 08/20/2016 9:12:14 AM This report has been signed electronically. Number of Addenda: 0

## 2016-08-21 ENCOUNTER — Encounter (HOSPITAL_COMMUNITY): Payer: Self-pay | Admitting: Internal Medicine

## 2016-08-21 ENCOUNTER — Encounter: Payer: Self-pay | Admitting: Family Medicine

## 2016-08-21 DIAGNOSIS — D126 Benign neoplasm of colon, unspecified: Secondary | ICD-10-CM | POA: Insufficient documentation

## 2016-08-27 ENCOUNTER — Ambulatory Visit: Payer: BLUE CROSS/BLUE SHIELD | Admitting: Family Medicine

## 2016-08-31 ENCOUNTER — Other Ambulatory Visit: Payer: Self-pay | Admitting: "Endocrinology

## 2016-09-03 ENCOUNTER — Ambulatory Visit (INDEPENDENT_AMBULATORY_CARE_PROVIDER_SITE_OTHER): Payer: BLUE CROSS/BLUE SHIELD | Admitting: Family Medicine

## 2016-09-03 ENCOUNTER — Encounter: Payer: Self-pay | Admitting: Family Medicine

## 2016-09-03 VITALS — BP 148/80 | HR 76 | Temp 98.5°F | Resp 18 | Ht 65.5 in | Wt 235.0 lb

## 2016-09-03 DIAGNOSIS — Z23 Encounter for immunization: Secondary | ICD-10-CM | POA: Diagnosis not present

## 2016-09-03 DIAGNOSIS — E782 Mixed hyperlipidemia: Secondary | ICD-10-CM

## 2016-09-03 DIAGNOSIS — F4321 Adjustment disorder with depressed mood: Secondary | ICD-10-CM

## 2016-09-03 DIAGNOSIS — E1165 Type 2 diabetes mellitus with hyperglycemia: Secondary | ICD-10-CM

## 2016-09-03 DIAGNOSIS — I10 Essential (primary) hypertension: Secondary | ICD-10-CM | POA: Diagnosis not present

## 2016-09-03 DIAGNOSIS — E1122 Type 2 diabetes mellitus with diabetic chronic kidney disease: Secondary | ICD-10-CM | POA: Diagnosis not present

## 2016-09-03 DIAGNOSIS — N182 Chronic kidney disease, stage 2 (mild): Secondary | ICD-10-CM

## 2016-09-03 DIAGNOSIS — IMO0002 Reserved for concepts with insufficient information to code with codable children: Secondary | ICD-10-CM

## 2016-09-03 NOTE — Patient Instructions (Addendum)
Try to stay active. Exercise as much as you are able Continue current medication I have referred to psychology

## 2016-09-03 NOTE — Progress Notes (Signed)
Chief Complaint  Patient presents with  . Follow-up    3 month   Diabetes well controlled A1c is 6.3 Has not been able to lose weight BP is up a bit, but at home is very good Lipids not controlled due to patient report of not tolerating the statin.  We discussed trying another, lower dose,  I calculated her [place] CVD risk with her and it is just over 34%.   I have discussed the multiple health risks associated with cigarette smoking including, but not limited to, cardiovascular disease, lung disease and cancer.  I have strongly recommended that smoking be stopped.  I have reviewed the various methods of quitting including cold [place], classes, nicotine replacements and prescription medications.  I have offered assistance in this difficult process.  The patient is not interested in assistance at this time. We discussed her mood.  She is labile and tearful.  Declines SSRI.  Is referred for counseling.  Patient Active Problem List   Diagnosis Date Noted  . Tubular adenoma of colon 08/21/2016  . Fibromyalgia 05/28/2016  . Uncontrolled type 2 diabetes mellitus with stage 2 chronic kidney disease ([place]) 04/30/2016  . Hyperlipidemia 04/30/2016  . Essential hypertension, benign 04/30/2016  . Morbid obesity due to excess calories ([place]) 04/30/2016  . Recent onset of diabetes mellitus ([place]) 03/10/2016  . Anxiety 03/05/2016  . Body aches 03/05/2016  . 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 Vitiligo 03/05/2016    Outpatient Encounter Prescriptions as of 09/03/2016  Medication Sig  . albuterol (PROVENTIL HFA;VENTOLIN HFA) 108 (90 Base) MCG/ACT inhaler Inhale 2 puffs into the lungs every 6 (six) hours as needed for wheezing or shortness of breath.  . Cholecalciferol (VITAMIN D3) 5000 units CAPS Take 1 capsule (5,000 Units total) by mouth daily.  . diphenhydrAMINE (BENADRYL) 25 MG tablet Take 50 mg by mouth at bedtime as needed for sleep.   Marland Kitchen ibuprofen (ADVIL,MOTRIN) 200 MG tablet Take 400 mg by  mouth as needed for headache or moderate pain.   Marland Kitchen losartan (COZAAR) 25 MG tablet TAKE ONE TABLET BY MOUTH ONCE DAILY.  . metFORMIN (GLUCOPHAGE) 500 MG tablet TAKE (1) TABLET BY MOUTH TWICE DAILY WITH MEALS.  Marland Kitchen OVER THE COUNTER MEDICATION Take 1 tablet by mouth daily. EHT supplement  . Polyethyl Glycol-Propyl Glycol (SYSTANE OP) Apply 1 drop to eye 2 (two) times daily.   No facility-administered encounter medications on file as of 09/03/2016.     Allergies  Allergen Reactions  . Lipitor [Atorvastatin] Other (See Comments)    Myalgia and joint pain    Review of Systems  Constitutional: Positive for fatigue. Negative for unexpected weight change.  HENT: Negative.  Negative for congestion and dental problem.   Eyes: Negative.  Negative for visual disturbance.  Respiratory: Negative for cough and choking.   Cardiovascular: Negative for chest pain and palpitations.  Gastrointestinal: Negative for constipation and diarrhea.  Genitourinary: Negative for difficulty urinating and frequency.  Musculoskeletal: Positive for arthralgias and myalgias.  Neurological: Negative for dizziness and headaches.  Psychiatric/Behavioral: Positive for dysphoric mood and sleep disturbance.    BP (!) 148/80   Pulse 76   Temp 98.5 F (36.9 C) (Oral)   Resp 18   Ht 5' 5.5" (1.664 m)   Wt 235 lb (106.6 kg)   SpO2 97%   BMI 38.51 kg/m   Physical Exam  Constitutional: She is oriented to person, place, and time. She appears well-developed and well-nourished. She appears distressed.  emotional lability  HENT:  Head: Normocephalic and atraumatic.  Mouth/Throat: Oropharynx is clear and moist.  Eyes: Conjunctivae are normal. Pupils are equal, round, and reactive to light.  Neck: Normal range of motion. Neck supple. No thyromegaly present.  Cardiovascular: Normal rate, regular rhythm and normal heart sounds.   Pulmonary/Chest: Effort normal and breath sounds normal. No respiratory distress.    Musculoskeletal: Normal range of motion. She exhibits no edema.  Lymphadenopathy:    She has no cervical adenopathy.  Neurological: She is alert and oriented to person, place, and time.  Skin: Skin is warm and dry.  Psychiatric: Her behavior is normal. Thought content normal.    ASSESSMENT/PLAN:  1. controlled type 2 diabetes mellitus with stage 2 chronic kidney disease, without long-term current use of insulin (HCC)  - CBC - COMPLETE METABOLIC PANEL WITH GFR - Hemoglobin A1c - Lipid panel - Urinalysis, Routine w reflex microscopic - Microalbumin / creatinine urine ratio - VITAMIN D 25 Hydroxy (Vit-D Deficiency, Fractures)  2. Essential hypertension, benign   3. Mixed hyperlipidemia Statin intolerant- to first one tried  4. Situational depression  - Ambulatory referral to Psychology   Patient Instructions  Try to stay active. Exercise as much as you are able Continue current medication I have referred to psychology see me in six months  Raylene Everts, MD

## 2016-10-08 DIAGNOSIS — E1165 Type 2 diabetes mellitus with hyperglycemia: Secondary | ICD-10-CM | POA: Diagnosis not present

## 2016-10-08 DIAGNOSIS — N182 Chronic kidney disease, stage 2 (mild): Secondary | ICD-10-CM | POA: Diagnosis not present

## 2016-10-08 DIAGNOSIS — E1122 Type 2 diabetes mellitus with diabetic chronic kidney disease: Secondary | ICD-10-CM | POA: Diagnosis not present

## 2016-10-09 LAB — CMP14+EGFR
A/G RATIO: 1.4 (ref 1.2–2.2)
ALBUMIN: 4.2 g/dL (ref 3.5–5.5)
ALT: 24 IU/L (ref 0–32)
AST: 21 IU/L (ref 0–40)
Alkaline Phosphatase: 96 IU/L (ref 39–117)
BILIRUBIN TOTAL: 0.5 mg/dL (ref 0.0–1.2)
BUN / CREAT RATIO: 21 (ref 9–23)
BUN: 23 mg/dL (ref 6–24)
CALCIUM: 9.3 mg/dL (ref 8.7–10.2)
CHLORIDE: 99 mmol/L (ref 96–106)
CO2: 24 mmol/L (ref 18–29)
Creatinine, Ser: 1.12 mg/dL — ABNORMAL HIGH (ref 0.57–1.00)
GFR, EST AFRICAN AMERICAN: 66 mL/min/{1.73_m2} (ref >59)
GFR, EST NON AFRICAN AMERICAN: 57 mL/min/{1.73_m2} — AB (ref >59)
GLOBULIN, TOTAL: 2.9 g/dL (ref 1.5–4.5)
Glucose: 128 mg/dL — ABNORMAL HIGH (ref 65–99)
POTASSIUM: 4.3 mmol/L (ref 3.5–5.2)
Sodium: 138 mmol/L (ref 134–144)
TOTAL PROTEIN: 7.1 g/dL (ref 6.0–8.5)

## 2016-10-09 LAB — MICROALBUMIN / CREATININE URINE RATIO
CREATININE, UR: 108.4 mg/dL
Microalb/Creat Ratio: 9.7 mg/g creat (ref 0.0–30.0)
Microalbumin, Urine: 10.5 ug/mL

## 2016-10-09 LAB — HEMOGLOBIN A1C
Est. average glucose Bld gHb Est-mCnc: 128 mg/dL
Hgb A1c MFr Bld: 6.1 % — ABNORMAL HIGH (ref 4.8–5.6)

## 2016-10-09 LAB — VITAMIN D 25 HYDROXY (VIT D DEFICIENCY, FRACTURES): VIT D 25 HYDROXY: 52.5 ng/mL (ref 30.0–100.0)

## 2016-10-15 ENCOUNTER — Ambulatory Visit (INDEPENDENT_AMBULATORY_CARE_PROVIDER_SITE_OTHER): Payer: BLUE CROSS/BLUE SHIELD | Admitting: "Endocrinology

## 2016-10-15 ENCOUNTER — Encounter: Payer: Self-pay | Admitting: "Endocrinology

## 2016-10-15 ENCOUNTER — Encounter: Payer: BLUE CROSS/BLUE SHIELD | Attending: "Endocrinology | Admitting: Nutrition

## 2016-10-15 VITALS — BP 140/87 | HR 71 | Ht 65.5 in | Wt 236.0 lb

## 2016-10-15 DIAGNOSIS — E119 Type 2 diabetes mellitus without complications: Secondary | ICD-10-CM

## 2016-10-15 DIAGNOSIS — IMO0002 Reserved for concepts with insufficient information to code with codable children: Secondary | ICD-10-CM

## 2016-10-15 DIAGNOSIS — E782 Mixed hyperlipidemia: Secondary | ICD-10-CM | POA: Diagnosis not present

## 2016-10-15 DIAGNOSIS — N182 Chronic kidney disease, stage 2 (mild): Secondary | ICD-10-CM

## 2016-10-15 DIAGNOSIS — E1165 Type 2 diabetes mellitus with hyperglycemia: Secondary | ICD-10-CM | POA: Diagnosis not present

## 2016-10-15 DIAGNOSIS — I10 Essential (primary) hypertension: Secondary | ICD-10-CM

## 2016-10-15 DIAGNOSIS — E559 Vitamin D deficiency, unspecified: Secondary | ICD-10-CM

## 2016-10-15 DIAGNOSIS — E1122 Type 2 diabetes mellitus with diabetic chronic kidney disease: Secondary | ICD-10-CM

## 2016-10-15 NOTE — Progress Notes (Signed)
  Medical Nutrition Therapy:  Appt start time: 1000 nd time:  1030  Assessment:  Primary concerns today:  Diabetes. New DM Type 2.    Cut out tea and sodas, and cut out white bread. Now eating wheat bread. Trying to follow more of a keto diet. She is frustrated that she is not losing more weight and thinks it has to do with her Fibromyalgia. Going to see a new Neurologist for evaluation of her Fibromyalgia.    Lab Results  Component Value Date   HGBA1C 6.1 (H) 10/08/2016   Preferred Learning Style:     No preference indicated     Change in progress   MEDICATIONS: see list    DIETARY INTAKE:   24-hr recall:  B ( AM):  Oatmeal or 2 rice cakes and orange or 1/2 pb sandwich on ww or egg sandwich Snk ( AM):  L ( PM): nausea;  Chef salad with raspberry vinegarette dressing or PB or [place] sandwich on ww bread, water Snk ( PM D ( PM): Steak, onion, mushrooms, water, salad. Water, french fries 6 Snk ( PM): water,  Beverages: water,   Usual physical activity: ADL   Estimated energy needs: 1500 calories 170 g carbohydrates 112 g protein 42 g fat  Progress Towards Goal(s):  In progress.   Nutritional Diagnosis:  NB-1.1 Food and nutrition-related knowledge deficit As related to Diabetes.  As evidenced by A1C .    Intervention:  Nutrition and Diabetes education provided on My Plate, CHO counting, meal planning, portion sizes, timing of meals, avoiding snacks between meals unless having a low blood sugar, target ranges for A1C and blood sugars, signs/symptoms and treatment of hyper/hypoglycemia, monitoring blood sugars, taking medications as prescribed, benefits of exercising 30 minutes per day and prevention of complications of DM.  Goals 1. Follow My Plate 2. Eat 30-45 grams of carbs per meal 3. Increase fresh fruits and low carb vegetables 4. Drink water 5. Cut out processed foods; chips, cookies and junk food. 6. Eat meals on time. 7. Take Metformin after breakfast and  dinner. 8. Exercise 60 minutes 4 days per week.  Teaching Method Utilized:  Visual Auditory Hands on  Handouts given during visit include:  The Plate Method  Meal Plan Card  Diabetes Instructions   Barriers to learning/adherence to lifestyle change:  none  Demonstrated degree of understanding via:  Teach Back   Monitoring/Evaluation:  Dietary intake, exercise, meal planning,  and body weight in 3 month(s).

## 2016-10-15 NOTE — Patient Instructions (Signed)
Goals 1. 30 minutes of exercise three times per week 2. Eat 2 carb choices per meals 3. Increase higher fiber foods 4. Lose 2 lbs per month

## 2016-10-15 NOTE — Progress Notes (Signed)
Subjective:    Patient ID: Erica Graham, female    DOB: 04-13-1966. Patient is being seen in consultation for management of diabetes requested by  Raylene Everts, MD  Past Medical History:  Diagnosis Date  . Anxiety 03/05/2016  . Arthritis    rheumatoid  . Asthma   . Body aches 03/05/2016  . Depression   . Fibromyalgia   . GERD (gastroesophageal reflux disease)   . Hyperlipidemia   . Hypertension   . Obesity   . Recent onset of diabetes mellitus ([place]) 03/10/2016   Referred to Dr Dorris Fetch and rx metformin 500 mg bid and zocor 20 mg 1 daily  . Uncontrolled type 2 diabetes mellitus with stage 2 chronic kidney disease ([place])   . 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 Vitiligo 03/05/2016   Past Surgical History:  Procedure Laterality Date  . CHOLECYSTECTOMY    . COLONOSCOPY N/A 08/20/2016   Procedure: COLONOSCOPY;  Surgeon: Rogene Houston, MD;  Location: [facility];  Service: Endoscopy;  Laterality: N/A;  830   . DILATION AND CURETTAGE OF UTERUS    . GALLBLADDER SURGERY    . LEG SURGERY Left    broke leg  . POLYPECTOMY  08/20/2016   Procedure: POLYPECTOMY;  Surgeon: Rogene Houston, MD;  Location: [facility];  Service: Endoscopy;;  colon  . TUBAL LIGATION     Social History   Social History  . Marital status: Divorced    Spouse name: N/A  . Number of children: 2  . Years of education: 13   Occupational History  . hairdresser     self   Social History Main Topics  . Smoking status: Current Every Day Smoker    Packs/day: 1.00    Years: 20.00    Types: Cigarettes  . Smokeless tobacco: Never Used  . Alcohol use Yes     Comment: occ  . Drug use: No  . Sexual activity: Not Currently    Birth control/ protection: Surgical     Comment: tubal   Other Topics Concern  . None   Social History Narrative   Lives at home with two children, son is 47, daughter is 72   Outpatient Encounter Prescriptions as of 10/15/2016  Medication Sig  . albuterol (PROVENTIL  HFA;VENTOLIN HFA) 108 (90 Base) MCG/ACT inhaler Inhale 2 puffs into the lungs every 6 (six) hours as needed for wheezing or shortness of breath.  . Cholecalciferol (VITAMIN D3) 5000 units CAPS Take 1 capsule (5,000 Units total) by mouth daily.  . diphenhydrAMINE (BENADRYL) 25 MG tablet Take 50 mg by mouth at bedtime as needed for sleep.   Marland Kitchen ibuprofen (ADVIL,MOTRIN) 200 MG tablet Take 400 mg by mouth as needed for headache or moderate pain.   Marland Kitchen losartan (COZAAR) 25 MG tablet TAKE ONE TABLET BY MOUTH ONCE DAILY.  . metFORMIN (GLUCOPHAGE) 500 MG tablet TAKE (1) TABLET BY MOUTH TWICE DAILY WITH MEALS.  Marland Kitchen OVER THE COUNTER MEDICATION Take 1 tablet by mouth daily. EHT supplement  . Polyethyl Glycol-Propyl Glycol (SYSTANE OP) Apply 1 drop to eye 2 (two) times daily.   No facility-administered encounter medications on file as of 10/15/2016.    ALLERGIES: Allergies  Allergen Reactions  . Lipitor [Atorvastatin] Other (See Comments)    Myalgia and joint pain   VACCINATION STATUS: Immunization History  Administered Date(s) Administered  . Influenza,inj,Quad PF,36+ Mos 05/28/2016  . Pneumococcal Conjugate-13 05/28/2016  . Tdap 09/03/2016    Diabetes  She presents for her follow-up diabetic visit. She has type 2 diabetes mellitus. Onset time: She was diagnosed at approximate age of 56 years. Her disease course has been improving. There are no hypoglycemic associated symptoms. Pertinent negatives for hypoglycemia include no confusion, headaches, pallor or seizures. There are no diabetic associated symptoms. Pertinent negatives for diabetes include no chest pain and no polyphagia. There are no hypoglycemic complications. Symptoms are improving. Diabetic complications include nephropathy. Risk factors for coronary artery disease include diabetes mellitus, dyslipidemia, hypertension, obesity, tobacco exposure and sedentary lifestyle. Current diabetic treatment includes oral agent (monotherapy). Her weight is  decreasing steadily. She is following a generally unhealthy diet. When asked about meal planning, she reported none. She has had a previous visit with a dietitian (She will see the dietitian today.). She never participates in exercise. An ACE inhibitor/angiotensin II receptor blocker is not being taken (She mentions adverse effects from lisinopril.).  Hyperlipidemia  This is a chronic problem. The current episode started more than 1 year ago. The problem is uncontrolled. Exacerbating diseases include diabetes and obesity. Associated symptoms include myalgias and shortness of breath. Pertinent negatives include no chest pain. She is currently on no antihyperlipidemic treatment. The current treatment provides no improvement of lipids. Risk factors for coronary artery disease include diabetes mellitus, dyslipidemia, hypertension, obesity and a sedentary lifestyle.  Hypertension  This is a chronic problem. The current episode started more than 1 year ago. The problem is uncontrolled. Associated symptoms include shortness of breath. Pertinent negatives include no chest pain, headaches or palpitations. Risk factors for coronary artery disease include dyslipidemia, diabetes mellitus, family history, obesity, smoking/tobacco exposure and sedentary lifestyle. Past treatments include nothing. The current treatment provides no improvement. Hypertensive end-organ damage includes kidney disease.     Review of Systems  Constitutional: Negative for activity change, chills, fever and unexpected weight change.  HENT: Negative for trouble swallowing and voice change.   Eyes: Negative for visual disturbance.  Respiratory: Positive for shortness of breath. Negative for cough and wheezing.   Cardiovascular: Negative for chest pain, palpitations and leg swelling.  Gastrointestinal: Negative for diarrhea, nausea and vomiting.  Endocrine: Negative for cold intolerance, heat intolerance and polyphagia.  Genitourinary:  Positive for frequency. Negative for dysuria and flank pain.  Musculoskeletal: Positive for myalgias. Negative for arthralgias.  Skin: Negative for color change, pallor, rash and wound.  Neurological: Negative for seizures and headaches.  Psychiatric/Behavioral: Negative for confusion and suicidal ideas.    Objective:    BP 140/87   Pulse 71   Ht 5' 5.5" (1.664 m)   Wt 236 lb (107 kg)   BMI 38.68 kg/m   Wt Readings from Last 3 Encounters:  10/15/16 232 lb (105.2 kg)  10/15/16 236 lb (107 kg)  09/03/16 235 lb (106.6 kg)    Physical Exam  Constitutional: She is oriented to person, place, and time. She appears well-developed.  HENT:  Head: Normocephalic and atraumatic.  Eyes: EOM are normal.  Neck: Normal range of motion. Neck supple. No tracheal deviation present. No thyromegaly present.  Cardiovascular: Normal rate and regular rhythm.   Pulmonary/Chest: Effort normal and breath sounds normal.  Abdominal: Soft. Bowel sounds are normal. There is no tenderness. There is no guarding.  Musculoskeletal: Normal range of motion. She exhibits no edema.  Neurological: She is alert and oriented to person, place, and time. She has normal reflexes. No cranial nerve deficit. Coordination normal.  Skin: Skin is warm and dry. No rash noted. No erythema. No pallor.  Psychiatric: She has a normal mood and affect. Judgment normal.  Reluctant affect.     CMP     Component Value Date/Time   NA 138 10/08/2016 1146   K 4.3 10/08/2016 1146   CL 99 10/08/2016 1146   CO2 24 10/08/2016 1146   GLUCOSE 128 (H) 10/08/2016 1146   BUN 23 10/08/2016 1146   CREATININE 1.12 (H) 10/08/2016 1146   CALCIUM 9.3 10/08/2016 1146   PROT 7.1 10/08/2016 1146   ALBUMIN 4.2 10/08/2016 1146   AST 21 10/08/2016 1146   ALT 24 10/08/2016 1146   ALKPHOS 96 10/08/2016 1146   BILITOT 0.5 10/08/2016 1146   GFRNONAA 57 (L) 10/08/2016 1146   GFRAA 66 10/08/2016 1146     Diabetic Labs (most recent): Lab Results   Component Value Date   HGBA1C 6.1 (H) 10/08/2016   HGBA1C 6.3 (H) 07/08/2016   HGBA1C 7.0 (H) 03/05/2016     Lipid Panel ( most recent) Lipid Panel     Component Value Date/Time   CHOL 188 03/05/2016 1127   TRIG 133 03/05/2016 1127   HDL 35 (L) 03/05/2016 1127   CHOLHDL 5.4 (H) 03/05/2016 1127   LDLCALC 126 (H) 03/05/2016 1127     Assessment & Plan:   1. Uncontrolled type 2 diabetes mellitus without complication, without long-term current use of insulin ([place])   - Patient has currently uncontrolled symptomatic type 2 DM since  51 years of age,  with most recent A1c of  6.1% improving from 7 %. Recent labs reviewed, showing improving renal function.  Her diabetes is complicated by chronic kidney disease, hypertension with intolerance to ACE inhibitors, hyperlipidemia with intolerance to statins, obesity, chronic heavy smoking and patient remains at a high risk for more acute and chronic complications of diabetes which include CAD, CVA, CKD, retinopathy, and neuropathy. These are all discussed in detail with the patient.  - I have counseled the patient on diet management and weight loss, by adopting a carbohydrate restricted/protein rich diet.  - Suggestion is made for patient to avoid simple carbohydrates   from their diet including Cakes , Desserts, Ice Cream,  Soda (  diet and regular) , Sweet Tea , Candies,  Chips, Cookies, Artificial Sweeteners,   and "Sugar-free" Products . This will help patient to have stable blood glucose profile and potentially avoid unintended weight gain.  - I encouraged the patient to switch to  unprocessed or minimally processed complex starch and increased protein intake (animal or plant source), fruits, and vegetables.  - Patient is advised to stick to a routine mealtimes to eat 3 meals  a day and avoid unnecessary snacks ( to snack only to correct hypoglycemia).  - The patient will be scheduled with Jearld Fenton, RDN, CDE for individualized DM  education.  - I have approached patient with the following individualized plan to manage diabetes and patient agrees:   - For her type 2 diabetes which is relatively recent onset she would not need any more therapy than metformin. - Due to abnormal renal function currently, I like to keep the dose lower at 500 mg by mouth once a day after breakfast.  -Patient is not a candidate for  SGLT2 i due to CKD.  - Patient will be considered for incretin therapy if necessary next visit.  - Patient specific target  A1c;  LDL, HDL, Triglycerides, and  Waist Circumference were discussed in detail.  2) BP/HTN:  Controlled. She has tolerated the losartan 25 mg daily which  was prescribed for her last visit. - Unfortunately she continues to smoke heavily. Even more important for her is  To quite smoking, which is emphasized heavily during their 3 visit she had with me. 3) Lipids/HPL:  Uncontrolled with LDL 126.   She mentions intolerance to simvastatin. On subsequent visit she'll be tried on low-dose Crestor. 4)  Weight/Diet:  she is obese, CDE Consult has been initiated , exercise, and detailed carbohydrates information provided. She is interested in weight-loss surgery, I gave her a brochure and information with phone numbers.  5) Chronic Care/Health Maintenance:  -Patient is started on ARB and  will be considered for Statin medications and encouraged to continue to follow up with Ophthalmology, Podiatrist at least yearly or according to recommendations, and advised to  Quit smoking. I have recommended yearly flu vaccine and pneumonia vaccination at least every 5 years; moderate intensity exercise for up to 150 minutes weekly; and  sleep for at least 7 hours a day.  - 30 minutes of time was spent on the care of this patient , 50% of which was applied for counseling on diabetes complications and their preventions.  - Patient to bring meter and  blood glucose logs during their next visit.   - I advised  patient to maintain close follow up with Raylene Everts, MD for primary care needs.  Follow up plan: - Return in 10 weeks with repeat labs for reevaluation.  Glade Lloyd, MD Phone: [phone]  Fax: [phone]   10/15/2016, 10:20 AM

## 2016-11-03 ENCOUNTER — Telehealth (HOSPITAL_COMMUNITY): Payer: Self-pay | Admitting: *Deleted

## 2016-11-03 NOTE — Telephone Encounter (Signed)
spoke with patient, she said actually she is doing better.   Her brother passed away and she was having issues with that, but is doing better.

## 2016-12-03 ENCOUNTER — Ambulatory Visit (INDEPENDENT_AMBULATORY_CARE_PROVIDER_SITE_OTHER): Payer: BLUE CROSS/BLUE SHIELD | Admitting: Family Medicine

## 2016-12-03 ENCOUNTER — Encounter: Payer: Self-pay | Admitting: Family Medicine

## 2016-12-03 VITALS — BP 130/80 | HR 80 | Temp 97.6°F | Resp 18 | Ht 66.0 in | Wt 232.1 lb

## 2016-12-03 DIAGNOSIS — M797 Fibromyalgia: Secondary | ICD-10-CM

## 2016-12-03 DIAGNOSIS — E1122 Type 2 diabetes mellitus with diabetic chronic kidney disease: Secondary | ICD-10-CM

## 2016-12-03 DIAGNOSIS — E118 Type 2 diabetes mellitus with unspecified complications: Secondary | ICD-10-CM | POA: Diagnosis not present

## 2016-12-03 DIAGNOSIS — E1165 Type 2 diabetes mellitus with hyperglycemia: Secondary | ICD-10-CM | POA: Diagnosis not present

## 2016-12-03 DIAGNOSIS — M069 Rheumatoid arthritis, unspecified: Secondary | ICD-10-CM | POA: Diagnosis not present

## 2016-12-03 DIAGNOSIS — N182 Chronic kidney disease, stage 2 (mild): Secondary | ICD-10-CM

## 2016-12-03 DIAGNOSIS — I10 Essential (primary) hypertension: Secondary | ICD-10-CM

## 2016-12-03 DIAGNOSIS — IMO0002 Reserved for concepts with insufficient information to code with codable children: Secondary | ICD-10-CM

## 2016-12-03 MED ORDER — METFORMIN HCL 500 MG PO TABS: ORAL_TABLET | ORAL | 3 refills | Status: DC

## 2016-12-03 MED ORDER — LOSARTAN POTASSIUM 25 MG PO TABS: 25.0000 mg | ORAL_TABLET | Freq: Every day | ORAL | 3 refills | Status: DC

## 2016-12-03 NOTE — Patient Instructions (Signed)
Your lab tests, blood pressure and weight are improved. good work  Need follow up and lab tests in six months  I have placed a referral to rheumatology

## 2016-12-03 NOTE — Progress Notes (Signed)
Chief Complaint  Patient presents with  . Follow-up    3 month   Patient is back for follow-up. She is compliant with treatment. She has lost 3 pounds. Her blood pressure is good. Her A1c dropped to 6.1. She is congratulated on her efforts. She is still trying to quit smoking but has not yet been successful. She tells me today that she has rheumatoid arthritis. She was previously under the care of a rheumatologist. She is requesting referral back to rheumatology. I told her that the area is short on rheumatologist might be some time before she is seen. She states her prior rheumatologist also cared for her fibromyalgia.  Patient Active Problem List   Diagnosis Date Noted  . Rheumatoid arthritis ([place]) 12/03/2016  . Vitamin D deficiency 10/15/2016  . Tubular adenoma of colon 08/21/2016  . Fibromyalgia 05/28/2016  . Type 2 diabetes mellitus ([place]) 04/30/2016  . Hyperlipidemia 04/30/2016  . Essential hypertension, benign 04/30/2016  . Morbid obesity due to excess calories ([place]) 04/30/2016  . Recent onset of diabetes mellitus ([place]) 03/10/2016  . Anxiety 03/05/2016  . Body aches 03/05/2016  . 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 Vitiligo 03/05/2016    Outpatient Encounter Prescriptions as of 12/03/2016  Medication Sig  . albuterol (PROVENTIL HFA;VENTOLIN HFA) 108 (90 Base) MCG/ACT inhaler Inhale 2 puffs into the lungs every 6 (six) hours as needed for wheezing or shortness of breath.  . Cholecalciferol (VITAMIN D3) 5000 units CAPS Take 1 capsule (5,000 Units total) by mouth daily.  . diphenhydrAMINE (BENADRYL) 25 MG tablet Take 50 mg by mouth at bedtime as needed for sleep.   Marland Kitchen ibuprofen (ADVIL,MOTRIN) 200 MG tablet Take 400 mg by mouth as needed for headache or moderate pain.   Marland Kitchen losartan (COZAAR) 25 MG tablet Take 1 tablet (25 mg total) by mouth daily.  . metFORMIN (GLUCOPHAGE) 500 MG tablet 500 mg daily  . Polyethyl Glycol-Propyl Glycol (SYSTANE OP) Apply 1 drop to eye 2 (two) times  daily.   No facility-administered encounter medications on file as of 12/03/2016.     Allergies  Allergen Reactions  . Lipitor [Atorvastatin] Other (See Comments)    Myalgia and joint pain    Review of Systems  Constitutional: Positive for fatigue. Negative for activity change, appetite change and unexpected weight change.  HENT: Negative for congestion, dental problem, postnasal drip and rhinorrhea.   Eyes: Negative for redness and visual disturbance.  Respiratory: Negative for cough and shortness of breath.   Cardiovascular: Negative for chest pain, palpitations and leg swelling.  Gastrointestinal: Negative for abdominal pain, constipation and diarrhea.  Genitourinary: Negative for difficulty urinating and frequency.  Musculoskeletal: Positive for back pain, joint swelling and myalgias. Negative for arthralgias.  Neurological: Negative for dizziness and headaches.  Psychiatric/Behavioral: Positive for dysphoric mood. Negative for sleep disturbance. The patient is not nervous/anxious.        Difficult teenager at home    BP 130/80 (BP Location: Right Arm, Patient Position: Sitting, Cuff Size: Large)   Pulse 80   Temp 97.6 F (36.4 C) (Temporal)   Resp 18   Ht 5\' 6"  (1.676 m)   Wt 232 lb 1.9 oz (105.3 kg)   SpO2 98%   BMI 37.47 kg/m   Physical Exam  Constitutional: She is oriented to person, place, and time. She appears well-developed and well-nourished.  Morbidly obese. Pleasant and cooperative.  HENT:  Head: Normocephalic and atraumatic.  Mouth/Throat: Oropharynx is clear and moist.  Eyes: Conjunctivae  are normal. Pupils are equal, round, and reactive to light.  Neck: Normal range of motion. Neck supple. No thyromegaly present.  Cardiovascular: Normal rate, regular rhythm and normal heart sounds.   Pulmonary/Chest: Effort normal and breath sounds normal. No respiratory distress.  Abdominal: Soft. Bowel sounds are normal.  Musculoskeletal: Normal range of motion. She  exhibits no edema.  No swelling or synovitis of joints. Flesh-colored nodules round elbow the patient states his rheumatoid.  Lymphadenopathy:    She has no cervical adenopathy.  Neurological: She is alert and oriented to person, place, and time.  Gait normal  Skin: Skin is warm and dry.  Psychiatric: She has a normal mood and affect. Her behavior is normal. Thought content normal.  Nursing note and vitals reviewed.   ASSESSMENT/PLAN:  1. Rheumatoid arthritis, involving unspecified site, unspecified rheumatoid factor presence ([place])  - Ambulatory referral to Rheumatology  2. Fibromyalgia  - Ambulatory referral to Rheumatology  3.  type 2 diabetes mellitus with stage 2 chronic kidney disease, without long-term current use of insulin ([facility]) Repeat labs in 6 months - CBC - COMPLETE METABOLIC PANEL WITH GFR - Hemoglobin A1c - Lipid panel - VITAMIN D 25 Hydroxy (Vit-D Deficiency, Fractures) - Microalbumin / creatinine urine ratio - Urinalysis, Routine w reflex microscopic  4. Essential hypertension, benign   5. Type 2 diabetes mellitus with complication, without long-term current use of insulin ([place]) Discussed the importance of regular exercise to help regulate her diabetes. Discussed the importance of exercise also helping with mood, and her obesity.   Patient Instructions  Your lab tests, blood pressure and weight are improved. good work  Need follow up and lab tests in six months  I have placed a referral to rheumatology       Raylene Everts, MD

## 2017-01-07 ENCOUNTER — Telehealth: Payer: Self-pay | Admitting: Family Medicine

## 2017-01-07 NOTE — Telephone Encounter (Signed)
Patient was referred to Dr.Deveshwar 12/03/16, no one contacted the patient nor  Primary Care to request records from previous rheumatologist, to move forward with this patients care. The response from [facility] was just entered under patients referral and that does not alert our office and prevents [place] from giving 5 star service.   Patients last rheumatologist was Dr.Rowe, she has not seen him in over 7 years, he has passed away since then and she nor I know how to get access to those records.  Please let us know ASAP what we need to do to move forward.

## 2017-01-08 NOTE — Telephone Encounter (Signed)
Dr Estanislado Pandy has reviewed the patient's chart and has accepted the patient. The front staff will contact the patient to schedule an appointment. Per Dr. Estanislado Pandy she will no treat Fibromyalgia.

## 2017-03-01 NOTE — Progress Notes (Signed)
Office Visit Note  Patient: Erica Graham             Date of Birth: [birth year]           MRN: [id number]             PCP: Raylene Everts, MD Referring: Raylene Everts, MD Visit Date: 03/04/2017 Occupation: @GUAROCC @    Subjective:  Pain in multiple joints.   History of Present Illness: Erica Graham is a 51 y.o. female seen in consultation per request of her PCP. According to patient in 2001 while she was in the postpartum period she started having increased joint pain and swelling. She was seen by Dr. Kristen Loader who diagnosed her with rheumatoid arthritis and fibromyalgia per patient. She states she was on multiple medications by her previous PCP which included pain medications, muscle relaxers, give a printed an antidepressant. Dr. Justine Null gradually were tapering her off the medications. She does not recall being treated with any DMARD's. She states after Dr. Justine Null passed away a few years back she stopped going to any of her physicians. She recently established with new PCP Dr. Meda Coffee. She continues to have pain and discomfort in her bilateral hands, bilateral hip joints, bilateral knee joints and lower back. She has noticed some swelling in her elbows, hands and her knee joints. She states she has generalized pain in all of her muscles or painful. She takes ibuprofen on when necessary basis. 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 She was given a topical steroid cream which is not effective. She states her granuloma annulare  lesions are painful.  Activities of Daily Living:  Patient reports morning stiffness for 60-90 minutes.   Patient Reports nocturnal pain.  Difficulty dressing/grooming: Denies Difficulty climbing stairs: Reports Difficulty getting out of chair: Reports Difficulty using hands for taps, buttons, cutlery, and/or writing: Reports   Review of Systems  Constitutional: Positive for fatigue. Negative for night sweats, weight  gain, weight loss and weakness.  HENT: Negative for mouth sores, trouble swallowing, trouble swallowing, mouth dryness and nose dryness.   Eyes: Negative for pain, redness, visual disturbance and dryness.  Respiratory: Negative for cough, shortness of breath and difficulty breathing.   Cardiovascular: Positive for hypertension. Negative for chest pain, palpitations, irregular heartbeat and swelling in legs/feet.  Gastrointestinal: Negative for blood in stool, constipation and diarrhea.  Endocrine: Negative for increased urination.  Genitourinary: Negative for vaginal dryness.  Musculoskeletal: Positive for arthralgias, joint pain, joint swelling, myalgias and myalgias. Negative for muscle weakness, morning stiffness and muscle tenderness.  Skin: Negative for color change, rash, hair loss, skin tightness, ulcers and sensitivity to sunlight.  Allergic/Immunologic: Negative for susceptible to infections.  Neurological: Negative for dizziness, memory loss and night sweats.  Hematological: Negative for swollen glands.  Psychiatric/Behavioral: Positive for sleep disturbance. Negative for depressed mood. The patient is not nervous/anxious.     PMFS History:  Patient Active Problem List   Diagnosis Date Noted  . 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 Smoker 03/04/2017  . Rheumatoid arthritis ([place]) 12/03/2016  . Vitamin D deficiency 10/15/2016  . Tubular adenoma of colon 08/21/2016  . Fibromyalgia 05/28/2016  . Type 2 diabetes mellitus ([place]) 04/30/2016  . Hyperlipidemia 04/30/2016  . Essential hypertension, benign 04/30/2016  . Morbid obesity due to excess calories ([place]) 04/30/2016  . Recent onset of diabetes mellitus ([place]) 03/10/2016  . Anxiety 03/05/2016  . Body aches 03/05/2016  . 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  Vitiligo 03/05/2016    Past Medical History:  Diagnosis Date  . Anxiety 03/05/2016  . Arthritis    rheumatoid  . Asthma   . Body aches 03/05/2016  . Depression   .  Fibromyalgia   . GERD (gastroesophageal reflux disease)   . Hyperlipidemia   . Hypertension   . Obesity   . Recent onset of diabetes mellitus ([place]) 03/10/2016   Referred to Dr Dorris Fetch and rx metformin 500 mg bid and zocor 20 mg 1 daily  . Uncontrolled type 2 diabetes mellitus with stage 2 chronic kidney disease ([place])   . 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 Vitiligo 03/05/2016    Family History  Problem Relation Age of Onset  . Cancer Paternal Grandfather        prostate  . Diabetes Paternal Grandmother   . Emphysema Maternal Grandmother   . Cancer Maternal Grandmother        lung  . Alcohol abuse Maternal Grandfather   . Diabetes Father   . Hypertension Mother   . Diabetes Mother   . Crohn's disease Sister    Past Surgical History:  Procedure Laterality Date  . CHOLECYSTECTOMY    . COLONOSCOPY N/A 08/20/2016   Procedure: COLONOSCOPY;  Surgeon: Rogene Houston, MD;  [facility];  Service: Endoscopy;  Laterality: N/A;  830   . DILATION AND CURETTAGE OF UTERUS    . GALLBLADDER SURGERY    . LEG SURGERY Left    broke leg  . POLYPECTOMY  08/20/2016   Procedure: POLYPECTOMY;  Surgeon: Rogene Houston, MD;  [facility];  Service: Endoscopy;;  colon  . TUBAL LIGATION     Social History   Social History Narrative   Lives at home with two children, son is 48, daughter is 16     Objective: Vital Signs: BP (!) 165/85 (BP Location: Left Arm, Patient Position: Sitting, Cuff Size: Normal)   Pulse 78   Resp 16   Ht 5\' 6"  (1.676 m)   Wt 240 lb (108.9 kg)   BMI 38.74 kg/m    Physical Exam  Constitutional: She is oriented to person, place, and time. She appears well-developed and well-nourished.  HENT:  Head: Normocephalic and atraumatic.  Eyes: Conjunctivae and EOM are normal.  Neck: Normal range of motion.  Cardiovascular: Normal rate, regular rhythm, normal heart sounds and intact distal pulses.   Pulmonary/Chest: Effort normal and breath sounds normal.    Abdominal: Soft. Bowel sounds are normal.  Lymphadenopathy:    She has no cervical adenopathy.  Neurological: She is alert and oriented to person, place, and time.  Skin: Skin is warm and dry. Capillary refill takes less than 2 seconds.  Multiple nodular lesions on her right hand and bilateral elbows  Psychiatric: She has a normal mood and affect. Her behavior is normal.  Nursing note and vitals reviewed.    Musculoskeletal Exam: C-spine and thoracic good range of motion. Lumbar spine limited range of motion. Shoulder joints elbow joints wrist joint MCPs PIPs DIPs are good range of motion with no synovitis. Hip joints knee joints ankles MTPs PIPs DIPs with good range of motion with no synovitis. She tenderness on palpation over bilateral trochanteric area. Fibromyalgia tender points at 10 out of 18 positive.  CDAI Exam: No CDAI exam completed.    Investigation: No additional findings. 10/08/2016 CMP creatinine 1.12 GFR 57, glucose 128, hemoglobin A1c 6.1%, vitamin D 52.2  Imaging: Xr Hip Unilat W Or W/o Pelvis  2-3 Views Left  Result Date: 03/04/2017 The hip joint narrowing or SI joint narrowing was noted.  Xr Hip Unilat W Or W/o Pelvis 2-3 Views Right  Result Date: 03/04/2017 No hip joint narrowing was noted. No SI joint narrowing was noted.  Xr Foot 2 Views Left  Result Date: 03/04/2017 First MTP all PIP/DIP narrowing was noted. No erosive changes were noted. No intertarsal joint space narrowing was noted. Posterior inferior calcaneal spurs were noted. These findings are consistent with osteoarthritis of the foot. Hardware in the left fibula was noted.  Xr Foot 2 Views Right  Result Date: 03/04/2017 First MTP all PIP/DIP narrowing was noted. No erosive changes were noted. No intertarsal joint space narrowing was noted. Posterior inferior calcaneal spurs were noted. These findings are consistent with osteoarthritis of the foot.  Xr Hand 2 View Left  Result Date:  03/04/2017 Minimal PIP narrowing. No MCP or intercarpal joint space narrowing was noted. No erosive changes were noted.  Xr Hand 2 View Right  Result Date: 03/04/2017 PIP narrowing was noted. No MCP joint narrowing or intercarpal joint space narrowing was noted. No erosive changes were noted.  Xr Knee 3 View Left  Result Date: 03/04/2017 Mild medial compartment narrowing was noted. No chondrocalcinosis was noted. Mild patellofemoral narrowing was noted. Impression: These findings are consistent with mild osteoarthritis and mild chondromalacia patella  Xr Knee 3 View Right  Result Date: 03/04/2017 Mild medial compartment narrowing was noted. No chondrocalcinosis was noted. Mild chondromalacia patella was noted. Impression: Mild osteoarthritis and mild chondromalacia patella  Xr Lumbar Spine 2-3 Views  Result Date: 03/04/2017 Mild scoliosis was noted. Multilevel spondylosis was noted. Old T11 compression fracture was noted. Facet joint arthropathy was noted. Impression multilevel spondylosis and facet joint arthropathy. T11 compression fracture noted.   Speciality Comments: No specialty comments available.    Procedures:  No procedures performed Allergies: Lipitor [atorvastatin]   Assessment / Plan:     Visit Diagnoses: Inflammatory arthritis - History of rheumatoid arthritis per patient . Patient states that she was diagnosed with rheumatoid arthritis by Dr. Justine Null. I do not see any synovitis on examination today. She states she was never treated with any DMARD's. And has not seen any physician in long time.  Pain in both hands - Plan: XR Hand 2 View Right, XR Hand 2 View Left, . X-rays consistent with mild osteoarthritis Rheumatoid factor, Cyclic citrul peptide antibody, IgG, Angiotensin converting enzyme, Uric acid  Pain in both feet - Plan: XR Foot 2 Views Right, XR Foot 2 Views Left. X-rays consistent with mild osteoarthritis  Pain in left hip - Plan: XR HIP UNILAT W OR W/O PELVIS  2-3 VIEWS LEFT. X-ray unremarkable  Pain in right hip - Plan: XR HIP UNILAT W OR W/O PELVIS 2-3 VIEWS RIGHT. X-ray unremarkable  Chronic pain of both knees - Plan: XR KNEE 3 VIEW LEFT, XR KNEE 3 VIEW RIGHT. X-rays consistent with mild osteoarthritis and mild chondral malacia patella  Chronic midline low back pain without sciatica . Patient gives history of disc disease of lumbar spine. She states she had MRI in the past.- Plan: XR Lumbar Spine 2-3 Views. X-rays consistent with multilevel spondylosis, scoliosis and old T11 compression fracture.  Other fatigue - Plan: CBC with Differential/Platelet, Urinalysis, Routine w reflex microscopic, Sedimentation rate, CK, TSH, Serum protein electrophoresis with reflex, ANA  Fibromyalgia: She continues to have generalized pain and discomfort and positive tender points.  Essential hypertension, benign: Her blood pressure is elevated today.  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 The topical agents are not working for her.  Morbid obesity due to excess calories ([place])  Smoker : Smoking cessation was discussed.   Orders: Orders Placed This Encounter  Procedures  . XR Hand 2 View Right  . XR Hand 2 View Left  . XR KNEE 3 VIEW LEFT  . XR KNEE 3 VIEW RIGHT  . XR Foot 2 Views Right  . XR Foot 2 Views Left  . XR Lumbar Spine 2-3 Views  . XR HIP UNILAT W OR W/O PELVIS 2-3 VIEWS RIGHT  . XR HIP UNILAT W OR W/O PELVIS 2-3 VIEWS LEFT  . CBC with Differential/Platelet  . Urinalysis, Routine w reflex microscopic  . Sedimentation rate  . CK  . TSH  . Rheumatoid factor  . Cyclic citrul peptide antibody, IgG  . Angiotensin converting enzyme  . Uric acid  . Serum protein electrophoresis with reflex  . ANA   No orders of the defined types were placed in this encounter.   Face-to-face time spent with patient was  50 minutes. 50% of time was spent in counseling and coordination of care.  Follow-Up Instructions: No Follow-up on file.   Bo Merino, MD  Note - This record has been created using Editor, commissioning.  Chart creation errors have been sought, but may not always  have been located. Such creation errors do not reflect on  the standard of medical care.

## 2017-03-04 ENCOUNTER — Ambulatory Visit (INDEPENDENT_AMBULATORY_CARE_PROVIDER_SITE_OTHER): Payer: Self-pay

## 2017-03-04 ENCOUNTER — Ambulatory Visit (INDEPENDENT_AMBULATORY_CARE_PROVIDER_SITE_OTHER): Payer: BLUE CROSS/BLUE SHIELD | Admitting: Rheumatology

## 2017-03-04 ENCOUNTER — Encounter: Payer: Self-pay | Admitting: Rheumatology

## 2017-03-04 VITALS — BP 165/85 | HR 78 | Resp 16 | Ht 66.0 in | Wt 240.0 lb

## 2017-03-04 DIAGNOSIS — E118 Type 2 diabetes mellitus with unspecified complications: Secondary | ICD-10-CM | POA: Diagnosis not present

## 2017-03-04 DIAGNOSIS — M25562 Pain in left knee: Secondary | ICD-10-CM

## 2017-03-04 DIAGNOSIS — M1990 Unspecified osteoarthritis, unspecified site: Secondary | ICD-10-CM

## 2017-03-04 DIAGNOSIS — I10 Essential (primary) hypertension: Secondary | ICD-10-CM

## 2017-03-04 DIAGNOSIS — M25551 Pain in right hip: Secondary | ICD-10-CM

## 2017-03-04 DIAGNOSIS — L920 Granuloma annulare: Secondary | ICD-10-CM | POA: Diagnosis not present

## 2017-03-04 DIAGNOSIS — M79641 Pain in right hand: Secondary | ICD-10-CM | POA: Diagnosis not present

## 2017-03-04 DIAGNOSIS — M545 Low back pain: Secondary | ICD-10-CM

## 2017-03-04 DIAGNOSIS — M79672 Pain in left foot: Secondary | ICD-10-CM

## 2017-03-04 DIAGNOSIS — L80 Vitiligo: Secondary | ICD-10-CM

## 2017-03-04 DIAGNOSIS — M25561 Pain in right knee: Secondary | ICD-10-CM | POA: Diagnosis not present

## 2017-03-04 DIAGNOSIS — M79642 Pain in left hand: Secondary | ICD-10-CM | POA: Diagnosis not present

## 2017-03-04 DIAGNOSIS — M25552 Pain in left hip: Secondary | ICD-10-CM | POA: Diagnosis not present

## 2017-03-04 DIAGNOSIS — F419 Anxiety disorder, unspecified: Secondary | ICD-10-CM | POA: Diagnosis not present

## 2017-03-04 DIAGNOSIS — M79671 Pain in right foot: Secondary | ICD-10-CM

## 2017-03-04 DIAGNOSIS — M797 Fibromyalgia: Secondary | ICD-10-CM | POA: Diagnosis not present

## 2017-03-04 DIAGNOSIS — R5383 Other fatigue: Secondary | ICD-10-CM | POA: Diagnosis not present

## 2017-03-04 DIAGNOSIS — G8929 Other chronic pain: Secondary | ICD-10-CM

## 2017-03-04 DIAGNOSIS — E559 Vitamin D deficiency, unspecified: Secondary | ICD-10-CM

## 2017-03-04 DIAGNOSIS — E782 Mixed hyperlipidemia: Secondary | ICD-10-CM

## 2017-03-04 DIAGNOSIS — D126 Benign neoplasm of colon, unspecified: Secondary | ICD-10-CM

## 2017-03-04 DIAGNOSIS — F17200 Nicotine dependence, unspecified, uncomplicated: Secondary | ICD-10-CM | POA: Insufficient documentation

## 2017-03-04 LAB — CBC WITH DIFFERENTIAL/PLATELET
BASOS ABS: 0 {cells}/uL (ref 0–200)
Basophils Relative: 0 %
EOS ABS: 0 {cells}/uL — AB (ref 15–500)
EOS PCT: 0 %
HCT: 41.5 % (ref 35.0–45.0)
Hemoglobin: 14.1 g/dL (ref 11.7–15.5)
LYMPHS PCT: 21 %
Lymphs Abs: 1512 cells/uL (ref 850–3900)
MCH: 32.3 pg (ref 27.0–33.0)
MCHC: 34 g/dL (ref 32.0–36.0)
MCV: 95.2 fL (ref 80.0–100.0)
MONOS PCT: 5 %
MPV: 10.9 fL (ref 7.5–12.5)
Monocytes Absolute: 360 cells/uL (ref 200–950)
Neutro Abs: 5328 cells/uL (ref 1500–7800)
Neutrophils Relative %: 74 %
PLATELETS: 157 10*3/uL (ref 140–400)
RBC: 4.36 MIL/uL (ref 3.80–5.10)
RDW: 13.5 % (ref 11.0–15.0)
WBC: 7.2 10*3/uL (ref 3.8–10.8)

## 2017-03-04 LAB — TSH: TSH: 1.57 mIU/L

## 2017-03-05 LAB — URINALYSIS, ROUTINE W REFLEX MICROSCOPIC
BILIRUBIN URINE: NEGATIVE
GLUCOSE, UA: NEGATIVE
KETONES UR: NEGATIVE
Leukocytes, UA: NEGATIVE
Nitrite: NEGATIVE
PROTEIN: NEGATIVE
Specific Gravity, Urine: 1.008 (ref 1.001–1.035)
pH: 6 (ref 5.0–8.0)

## 2017-03-05 LAB — URINALYSIS, MICROSCOPIC ONLY
Bacteria, UA: NONE SEEN [HPF]
Casts: NONE SEEN [LPF]
Crystals: NONE SEEN [HPF]
RBC / HPF: NONE SEEN RBC/HPF (ref <=2)
SQUAMOUS EPITHELIAL / LPF: NONE SEEN [HPF] (ref <=5)
WBC, UA: NONE SEEN WBC/HPF (ref <=5)
YEAST: NONE SEEN [HPF]

## 2017-03-05 LAB — URIC ACID: URIC ACID, SERUM: 6.4 mg/dL (ref 2.5–7.0)

## 2017-03-05 LAB — CK: Total CK: 99 U/L (ref 29–143)

## 2017-03-05 LAB — ANTI-NUCLEAR AB-TITER (ANA TITER)

## 2017-03-05 LAB — RHEUMATOID FACTOR: Rhuematoid fact SerPl-aCnc: <14 IU/mL (ref <14)

## 2017-03-05 LAB — ANGIOTENSIN CONVERTING ENZYME: Angiotensin-Converting Enzyme: 53 U/L (ref 9–67)

## 2017-03-05 LAB — ANA: Anti Nuclear Antibody(ANA): POSITIVE — AB

## 2017-03-05 LAB — SEDIMENTATION RATE: Sed Rate: 14 mm/hr (ref 0–30)

## 2017-03-05 LAB — CYCLIC CITRUL PEPTIDE ANTIBODY, IGG: Cyclic Citrullin Peptide Ab: <16 Units

## 2017-03-10 LAB — PROTEIN ELECTROPHORESIS, SERUM, WITH REFLEX
ALBUMIN ELP: 3.9 g/dL (ref 3.8–4.8)
ALPHA-1-GLOBULIN: 0.3 g/dL (ref 0.2–0.3)
ALPHA-2-GLOBULIN: 0.8 g/dL (ref 0.5–0.9)
Beta 2: 0.3 g/dL (ref 0.2–0.5)
Beta Globulin: 0.5 g/dL (ref 0.4–0.6)
Gamma Globulin: 1 g/dL (ref 0.8–1.7)
TOTAL PROTEIN, SERUM ELECTROPHOR: 6.9 g/dL (ref 6.1–8.1)

## 2017-03-10 LAB — IFE INTERPRETATION: Immunofix Electr Int: NOT DETECTED

## 2017-03-10 NOTE — Progress Notes (Signed)
Add ENA,C3,C4, aCL, b2, LA. Sch [place] bilat hands.

## 2017-03-12 ENCOUNTER — Other Ambulatory Visit: Payer: Self-pay | Admitting: *Deleted

## 2017-03-12 DIAGNOSIS — M2550 Pain in unspecified joint: Secondary | ICD-10-CM

## 2017-03-18 ENCOUNTER — Other Ambulatory Visit: Payer: Self-pay

## 2017-03-18 DIAGNOSIS — M2550 Pain in unspecified joint: Secondary | ICD-10-CM | POA: Diagnosis not present

## 2017-03-19 LAB — CP5000020 ENA PANEL
ENA SM Ab Ser-aCnc: <1
RIBONUCLEIC PROTEIN(ENA) ANTIBODY, IGG: POSITIVE — AB
SCLERODERMA (SCL-70) (ENA) ANTIBODY, IGG: NEGATIVE
SSA (RO) (ENA) ANTIBODY, IGG: NEGATIVE
SSB (La) (ENA) Antibody, IgG: <1
ds DNA Ab: 1 IU/mL

## 2017-03-19 LAB — CARDIOLIPIN ANTIBODIES, IGG, IGM, IGA
ANTICARDIOLIPIN IGM: 15 [MPL'U] — AB
Anticardiolipin IgA: <11 [APL'U]

## 2017-03-19 LAB — C3 AND C4
C3 Complement: 139 mg/dL (ref 83–193)
C4 Complement: 29 mg/dL (ref 15–57)

## 2017-03-22 LAB — BETA-2 GLYCOPROTEIN ANTIBODIES
BETA-2-GLYCOPROTEIN I IGM: 10 SMU (ref <=20)
Beta-2 Glyco I IgG: <9 SGU (ref <=20)

## 2017-03-22 NOTE — Progress Notes (Signed)
Will discuss at fu visit

## 2017-03-24 DIAGNOSIS — L920 Granuloma annulare: Secondary | ICD-10-CM | POA: Diagnosis not present

## 2017-03-31 ENCOUNTER — Telehealth: Payer: Self-pay | Admitting: Family Medicine

## 2017-03-31 NOTE — Telephone Encounter (Signed)
Patient requesting 5mg  of prednisone (for granuloma annulare) . Wanted to see Dr. Meda Coffee or someone before Monday.  She took  Her last prednisone today that was prescribed by the [place] Dermatologist.  She is asking for enough until she can be seen again.  She goes to Dr. Estanislado Pandy this Thursday and Dr. Herminio Heads would not prescribe and told her she would need to be seen by the PCP and have her prescribe. She states that Dr. Estanislado Pandy will treat her exclusively for arthritis.

## 2017-03-31 NOTE — Telephone Encounter (Signed)
Called and spoke to [place], is going to call her dermatologist tomorrow.

## 2017-04-06 NOTE — Progress Notes (Signed)
Office Visit Note  Patient: Erica Graham             Date of Birth: 10/12/1965           MRN: [id number]             PCP: Raylene Everts, MD Referring: Raylene Everts, MD Visit Date: 04/08/2017 Occupation: @GUAROCC @    Subjective:  Joint pain.   History of Present Illness: Erica Graham is a 51 y.o. female with history of autoimmune disease and osteoarthritis. She states she saw dermatologist for worsening rash on her hands. She had a skin biopsy which was consistent with granuloma annulare. She was given some topical steroids and also oral steroids for one week. She finished her oral steroids a week ago. My plan was to do ultrasound of her hands to look for inflammation but is not a good time done because he just took the steroids side effects your. She states that really helped her rash and also helped her joint pain.   Activities of Daily Living:  Patient reports morning stiffness for 45   minutes.   Patient Reports nocturnal pain.  Difficulty dressing/grooming: Denies Difficulty climbing stairs: Reports Difficulty getting out of chair: Denies Difficulty using hands for taps, buttons, cutlery, and/or writing: Reports   Review of Systems  Constitutional: Positive for fatigue.  HENT: Negative for mouth dryness.   Eyes: Positive for dryness.  Respiratory: Negative for cough and shortness of breath.   Cardiovascular: Positive for hypertension. Negative for chest pain, palpitations and swelling in legs/feet.  Gastrointestinal: Negative for blood in stool, constipation and diarrhea.  Genitourinary: Negative for nocturia.  Musculoskeletal: Positive for arthralgias, joint pain and morning stiffness. Negative for joint swelling, myalgias, muscle weakness, muscle tenderness and myalgias.  Skin: Positive for rash and sensitivity to sunlight. Negative for hair loss.       On hands  Neurological: Negative for dizziness, numbness and headaches.  Psychiatric/Behavioral:  Negative for depressed mood and sleep disturbance.    PMFS History:  Patient Active Problem List   Diagnosis Date Noted  . Primary osteoarthritis of both hands 04/08/2017  . Primary osteoarthritis of both feet 04/08/2017  . Primary osteoarthritis of both knees 04/08/2017  . scoliosis, lumbar region 04/08/2017  . DDD (degenerative disc disease), lumbar 04/08/2017  . Positive ANA (antinuclear antibody) 04/08/2017  . 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 Smoker 03/04/2017  . Rheumatoid arthritis ([place]) 12/03/2016  . Vitamin D deficiency 10/15/2016  . Tubular adenoma of colon 08/21/2016  . Fibromyalgia 05/28/2016  . Type 2 diabetes mellitus ([place]) 04/30/2016  . Hyperlipidemia 04/30/2016  . Essential hypertension, benign 04/30/2016  . Morbid obesity due to excess calories ([place]) 04/30/2016  . Recent onset of diabetes mellitus ([place]) 03/10/2016  . Anxiety 03/05/2016  . Body aches 03/05/2016  . 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 Vitiligo 03/05/2016    Past Medical History:  Diagnosis Date  . Anxiety 03/05/2016  . Arthritis    rheumatoid  . Asthma   . Body aches 03/05/2016  . Depression   . Fibromyalgia   . GERD (gastroesophageal reflux disease)   . Hyperlipidemia   . Hypertension   . Obesity   . Recent onset of diabetes mellitus ([place]) 03/10/2016   Referred to Dr Dorris Fetch and rx metformin 500 mg bid and zocor 20 mg 1 daily  . 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 Vitiligo 03/05/2016    Family History  Problem Relation Age of Onset  .  Cancer Paternal Grandfather        prostate  . Diabetes Paternal Grandmother   . Emphysema Maternal Grandmother   . Cancer Maternal Grandmother        lung  . Alcohol abuse Maternal Grandfather   . Diabetes Father   . Hypertension Mother   . Diabetes Mother   . Crohn's disease Sister    Past Surgical History:  Procedure Laterality Date  . CHOLECYSTECTOMY    . COLONOSCOPY N/A 08/20/2016   Procedure: COLONOSCOPY;  Surgeon: Rogene Houston, MD;  [facility];  Service: Endoscopy;  Laterality: N/A;  830   . DILATION AND CURETTAGE OF UTERUS    . GALLBLADDER SURGERY    . LEG SURGERY Left    broke leg  . POLYPECTOMY  08/20/2016   Procedure: POLYPECTOMY;  Surgeon: Rogene Houston, MD;  [facility];  Service: Endoscopy;;  colon  . TUBAL LIGATION     Social History   Social History Narrative   Lives at home with two children, son is 70, daughter is 16     Objective: Vital Signs: BP (!) 144/80 (BP Location: Left Arm, Patient Position: Sitting, Cuff Size: Normal)   Pulse 84   Ht 5' 6"  (1.676 m)   Wt 238 lb (108 kg)   BMI 38.41 kg/m    Physical Exam  Constitutional: She is oriented to person, place, and time. She appears well-developed and well-nourished.  HENT:  Head: Normocephalic and atraumatic.  Eyes: Conjunctivae and EOM are normal.  Neck: Normal range of motion.  Cardiovascular: Normal rate, regular rhythm, normal heart sounds and intact distal pulses.   Pulmonary/Chest: Effort normal and breath sounds normal.  Abdominal: Soft. Bowel sounds are normal.  Lymphadenopathy:    She has no cervical adenopathy.  Neurological: She is alert and oriented to person, place, and time.  Skin: Skin is warm and dry. Capillary refill takes less than 2 seconds.  Granuloma annulare her rash noted on her bilateral elbows and her right hand  Psychiatric: She has a normal mood and affect. Her behavior is normal.  Nursing note and vitals reviewed.    Musculoskeletal Exam: C-spine and thoracic spine good range of motion. She has discomfort range of motion of her lumbar spine. Shoulder joints although joints wrist joints MCPs PIPs with good range of motion. She had thickening of PIP/DIP joints bilateral hands with no synovitis. Hip joints are good range of motion. She is crepitus with range of motion of her bilateral knee joints. Ankle joints MTPs PIPs are good range of motion with no synovitis.  CDAI Exam: CDAI Homunculus Exam:    Joint Counts:  CDAI Tender Joint count: 0 CDAI Swollen Joint count: 0  Global Assessments:  Patient Global Assessment: 6 Provider Global Assessment: 2  CDAI Calculated Score: 8    Investigation: No additional findings. CBC Latest Ref Rng & Units 03/04/2017 03/05/2016  WBC 3.8 - 10.8 K/uL 7.2 9.5  Hemoglobin 11.7 - 15.5 g/dL 14.1 15.8  Hematocrit 35.0 - 45.0 % 41.5 45.7  Platelets 140 - 400 K/uL 157 173   CMP     Component Value Date/Time   NA 138 10/08/2016 1146   K 4.3 10/08/2016 1146   CL 99 10/08/2016 1146   CO2 24 10/08/2016 1146   GLUCOSE 128 (H) 10/08/2016 1146   BUN 23 10/08/2016 1146   CREATININE 1.12 (H) 10/08/2016 1146   CALCIUM 9.3 10/08/2016 1146   PROT 7.1 10/08/2016 1146  ALBUMIN 4.2 10/08/2016 1146   AST 21 10/08/2016 1146   ALT 24 10/08/2016 1146   ALKPHOS 96 10/08/2016 1146   BILITOT 0.5 10/08/2016 1146   GFRNONAA 57 (L) 10/08/2016 1146   GFRAA 66 10/08/2016 1146  UA 1+ hemoglobin, CK 99, TSH normal, ESR 14 IFE negative for monoclonal protein, ANA 1:160NH, ENA RNP positive(dsDNA, SCL 70, SSA, SSB, Smith negative), anticardiolipin negative beta-2  negative, C3-C4 normal, RF negative, anti-CCP negative, Ace negative, uric acid 6.4  Imaging: No results found.  Speciality Comments: No specialty comments available.    Procedures:  No procedures performed Allergies: Lipitor [atorvastatin]   Assessment / Plan:     Visit Diagnoses: Positive ANA (antinuclear antibody) - History of inflammatory arthritis, ANA 1:160NS, positive RNP, plan ultrasound bilateral hands. Patient had prednisone recently. I do not see any synovitis on examination today. She did not have any synovitis during the last visit as well. At this point I do not want to initiate any therapy we will repeat her labs in 6 months.  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  Fibromyalgia: She continues to have generalized pain and discomfort and is followed by her PCP. Need for regular exercise was discussed.  Essential hypertension, benign: Her blood pressure is elevated today have advised her to monitor closely.  Her other medical problems are listed as follows:  Mixed hyperlipidemia  Type 2 diabetes mellitus  Anxiety  Smoker  Vitamin D deficiency    Orders: No orders of the defined types were placed in this encounter.  No orders of the defined types were placed in this encounter.    Follow-Up Instructions: Return in about 6 months (around 10/07/2017).   Bo Merino, MD  Note - This record has been created using Editor, commissioning.  Chart creation errors have been sought, but may not always  have been located. Such creation errors do not reflect on  the standard of medical care.

## 2017-04-08 ENCOUNTER — Ambulatory Visit (INDEPENDENT_AMBULATORY_CARE_PROVIDER_SITE_OTHER): Payer: BLUE CROSS/BLUE SHIELD | Admitting: Rheumatology

## 2017-04-08 ENCOUNTER — Encounter: Payer: Self-pay | Admitting: Rheumatology

## 2017-04-08 VITALS — BP 144/80 | HR 84 | Ht 66.0 in | Wt 238.0 lb

## 2017-04-08 DIAGNOSIS — R7689 Other specified abnormal immunological findings in serum: Secondary | ICD-10-CM

## 2017-04-08 DIAGNOSIS — E118 Type 2 diabetes mellitus with unspecified complications: Secondary | ICD-10-CM | POA: Diagnosis not present

## 2017-04-08 DIAGNOSIS — F419 Anxiety disorder, unspecified: Secondary | ICD-10-CM

## 2017-04-08 DIAGNOSIS — M797 Fibromyalgia: Secondary | ICD-10-CM

## 2017-04-08 DIAGNOSIS — M19041 Primary osteoarthritis, right hand: Secondary | ICD-10-CM | POA: Diagnosis not present

## 2017-04-08 DIAGNOSIS — M170 Bilateral primary osteoarthritis of knee: Secondary | ICD-10-CM

## 2017-04-08 DIAGNOSIS — M19071 Primary osteoarthritis, right ankle and foot: Secondary | ICD-10-CM

## 2017-04-08 DIAGNOSIS — M5136 Other intervertebral disc degeneration, lumbar region: Secondary | ICD-10-CM | POA: Diagnosis not present

## 2017-04-08 DIAGNOSIS — M51369 Other intervertebral disc degeneration, lumbar region without mention of lumbar back pain or lower extremity pain: Secondary | ICD-10-CM

## 2017-04-08 DIAGNOSIS — E782 Mixed hyperlipidemia: Secondary | ICD-10-CM | POA: Diagnosis not present

## 2017-04-08 DIAGNOSIS — M4126 Other idiopathic scoliosis, lumbar region: Secondary | ICD-10-CM

## 2017-04-08 DIAGNOSIS — I10 Essential (primary) hypertension: Secondary | ICD-10-CM | POA: Diagnosis not present

## 2017-04-08 DIAGNOSIS — L920 Granuloma annulare: Secondary | ICD-10-CM

## 2017-04-08 DIAGNOSIS — F17200 Nicotine dependence, unspecified, uncomplicated: Secondary | ICD-10-CM

## 2017-04-08 DIAGNOSIS — R768 Other specified abnormal immunological findings in serum: Secondary | ICD-10-CM | POA: Diagnosis not present

## 2017-04-08 DIAGNOSIS — E559 Vitamin D deficiency, unspecified: Secondary | ICD-10-CM

## 2017-04-08 DIAGNOSIS — M19042 Primary osteoarthritis, left hand: Secondary | ICD-10-CM

## 2017-04-08 DIAGNOSIS — M19072 Primary osteoarthritis, left ankle and foot: Secondary | ICD-10-CM

## 2017-04-08 NOTE — Patient Instructions (Signed)
Labs in 6 months . Ten days prior to your appointment.

## 2017-04-15 DIAGNOSIS — E1122 Type 2 diabetes mellitus with diabetic chronic kidney disease: Secondary | ICD-10-CM | POA: Diagnosis not present

## 2017-04-15 DIAGNOSIS — E1165 Type 2 diabetes mellitus with hyperglycemia: Secondary | ICD-10-CM | POA: Diagnosis not present

## 2017-04-15 DIAGNOSIS — N182 Chronic kidney disease, stage 2 (mild): Secondary | ICD-10-CM | POA: Diagnosis not present

## 2017-04-16 LAB — CMP14+EGFR
A/G RATIO: 1.6 (ref 1.2–2.2)
ALT: 20 IU/L (ref 0–32)
AST: 17 IU/L (ref 0–40)
Albumin: 4.4 g/dL (ref 3.5–5.5)
Alkaline Phosphatase: 101 IU/L (ref 39–117)
BILIRUBIN TOTAL: 0.5 mg/dL (ref 0.0–1.2)
BUN/Creatinine Ratio: 13 (ref 9–23)
BUN: 16 mg/dL (ref 6–24)
CHLORIDE: 102 mmol/L (ref 96–106)
CO2: 21 mmol/L (ref 20–29)
Calcium: 9.6 mg/dL (ref 8.7–10.2)
Creatinine, Ser: 1.25 mg/dL — ABNORMAL HIGH (ref 0.57–1.00)
GFR calc Af Amer: 58 mL/min/{1.73_m2} — ABNORMAL LOW (ref >59)
GFR calc non Af Amer: 50 mL/min/{1.73_m2} — ABNORMAL LOW (ref >59)
GLOBULIN, TOTAL: 2.8 g/dL (ref 1.5–4.5)
Glucose: 101 mg/dL — ABNORMAL HIGH (ref 65–99)
POTASSIUM: 5 mmol/L (ref 3.5–5.2)
SODIUM: 139 mmol/L (ref 134–144)
Total Protein: 7.2 g/dL (ref 6.0–8.5)

## 2017-04-16 LAB — HEMOGLOBIN A1C
ESTIMATED AVERAGE GLUCOSE: 126 mg/dL
Hgb A1c MFr Bld: 6 % — ABNORMAL HIGH (ref 4.8–5.6)

## 2017-04-16 LAB — TSH: TSH: 1.86 u[IU]/mL (ref 0.450–4.500)

## 2017-04-16 LAB — T4, FREE: FREE T4: 1.21 ng/dL (ref 0.82–1.77)

## 2017-04-22 ENCOUNTER — Encounter: Payer: Self-pay | Admitting: "Endocrinology

## 2017-04-22 ENCOUNTER — Encounter: Payer: BLUE CROSS/BLUE SHIELD | Attending: Family Medicine | Admitting: Nutrition

## 2017-04-22 ENCOUNTER — Ambulatory Visit (INDEPENDENT_AMBULATORY_CARE_PROVIDER_SITE_OTHER): Payer: BLUE CROSS/BLUE SHIELD | Admitting: "Endocrinology

## 2017-04-22 VITALS — BP 152/89 | HR 80 | Ht 66.0 in | Wt 237.0 lb

## 2017-04-22 VITALS — Wt 237.0 lb

## 2017-04-22 DIAGNOSIS — Z713 Dietary counseling and surveillance: Secondary | ICD-10-CM | POA: Diagnosis not present

## 2017-04-22 DIAGNOSIS — Z7984 Long term (current) use of oral hypoglycemic drugs: Secondary | ICD-10-CM | POA: Diagnosis not present

## 2017-04-22 DIAGNOSIS — M797 Fibromyalgia: Secondary | ICD-10-CM | POA: Insufficient documentation

## 2017-04-22 DIAGNOSIS — E119 Type 2 diabetes mellitus without complications: Secondary | ICD-10-CM | POA: Diagnosis not present

## 2017-04-22 DIAGNOSIS — E669 Obesity, unspecified: Secondary | ICD-10-CM

## 2017-04-22 DIAGNOSIS — I10 Essential (primary) hypertension: Secondary | ICD-10-CM | POA: Diagnosis not present

## 2017-04-22 DIAGNOSIS — E782 Mixed hyperlipidemia: Secondary | ICD-10-CM | POA: Diagnosis not present

## 2017-04-22 DIAGNOSIS — F17210 Nicotine dependence, cigarettes, uncomplicated: Secondary | ICD-10-CM | POA: Diagnosis not present

## 2017-04-22 NOTE — Patient Instructions (Addendum)
Goals 1.  Get back to eating breakfast daily. 2. Drink only water 3.  Make sure lean protein with meals. 4. Add vegetables with lunch and dinner Exercise when you can. Don't skip meals.

## 2017-04-22 NOTE — Patient Instructions (Signed)

## 2017-04-22 NOTE — Progress Notes (Signed)
Subjective:    Patient ID: Erica Graham, female    DOB: 12-07-1965. Patient is being seen in consultation for management of diabetes requested by  Raylene Everts, MD  Past Medical History:  Diagnosis Date  . Anxiety 03/05/2016  . Arthritis    rheumatoid  . Asthma   . Body aches 03/05/2016  . Depression   . Fibromyalgia   . GERD (gastroesophageal reflux disease)   . Hyperlipidemia   . Hypertension   . Obesity   . Recent onset of diabetes mellitus ([place]) 03/10/2016   Referred to Dr Dorris Fetch and rx metformin 500 mg bid and zocor 20 mg 1 daily  . 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 Vitiligo 03/05/2016   Past Surgical History:  Procedure Laterality Date  . CHOLECYSTECTOMY    . COLONOSCOPY N/A 08/20/2016   Procedure: COLONOSCOPY;  Surgeon: Rogene Houston, MD;  Location: [facility];  Service: Endoscopy;  Laterality: N/A;  830   . DILATION AND CURETTAGE OF UTERUS    . GALLBLADDER SURGERY    . LEG SURGERY Left    broke leg  . POLYPECTOMY  08/20/2016   Procedure: POLYPECTOMY;  Surgeon: Rogene Houston, MD;  Location: [facility];  Service: Endoscopy;;  colon  . TUBAL LIGATION     Social History   Social History  . Marital status: Divorced    Spouse name: N/A  . Number of children: 2  . Years of education: 13   Occupational History  . hairdresser     self   Social History Main Topics  . Smoking status: Current Every Day Smoker    Packs/day: 1.00    Years: 20.00    Types: Cigarettes  . Smokeless tobacco: Never Used  . Alcohol use Yes     Comment: occ 1 monthly   . Drug use: No  . Sexual activity: Not Currently    Birth control/ protection: Surgical     Comment: tubal   Other Topics Concern  . None   Social History Narrative   Lives at home with two children, son is 37, daughter is 38   Outpatient Encounter Prescriptions as of 04/22/2017  Medication Sig  . albuterol (PROVENTIL HFA;VENTOLIN HFA) 108 (90 Base) MCG/ACT inhaler Inhale 2 puffs into the lungs  every 6 (six) hours as needed for wheezing or shortness of breath.  . Cholecalciferol (VITAMIN D3) 5000 units CAPS Take 1 capsule (5,000 Units total) by mouth daily.  . diphenhydrAMINE (BENADRYL) 25 MG tablet Take 50 mg by mouth at bedtime as needed for sleep.   Marland Kitchen ibuprofen (ADVIL,MOTRIN) 200 MG tablet Take 400 mg by mouth as needed for headache or moderate pain.   Marland Kitchen losartan (COZAAR) 25 MG tablet Take 1 tablet (25 mg total) by mouth daily.  . metFORMIN (GLUCOPHAGE) 500 MG tablet 500 mg daily  . Polyethyl Glycol-Propyl Glycol (SYSTANE OP) Apply 1 drop to eye as needed.    No facility-administered encounter medications on file as of 04/22/2017.    ALLERGIES: Allergies  Allergen Reactions  . Lipitor [Atorvastatin] Other (See Comments)    Myalgia and joint pain   VACCINATION STATUS: Immunization History  Administered Date(s) Administered  . Influenza,inj,Quad PF,6+ Mos 05/28/2016  . Pneumococcal Conjugate-13 05/28/2016  . Tdap 09/03/2016    Diabetes  She presents for her follow-up diabetic visit. She has type 2 diabetes mellitus. Onset time: She was diagnosed at approximate age of 36 years. Her disease course has been improving. There  are no hypoglycemic associated symptoms. Pertinent negatives for hypoglycemia include no confusion, headaches, pallor or seizures. There are no diabetic associated symptoms. Pertinent negatives for diabetes include no chest pain and no polyphagia. There are no hypoglycemic complications. Symptoms are improving. Diabetic complications include nephropathy. Risk factors for coronary artery disease include diabetes mellitus, dyslipidemia, hypertension, obesity, tobacco exposure and sedentary lifestyle. Current diabetic treatment includes oral agent (monotherapy). Her weight is stable. She is following a generally unhealthy diet. When asked about meal planning, she reported none. She has had a previous visit with a dietitian (She will see the dietitian today.). She  never participates in exercise. An ACE inhibitor/angiotensin II receptor blocker is not being taken (She mentions adverse effects from lisinopril.).  Hyperlipidemia  This is a chronic problem. The current episode started more than 1 year ago. The problem is uncontrolled. Exacerbating diseases include diabetes and obesity. Associated symptoms include myalgias and shortness of breath. Pertinent negatives include no chest pain. She is currently on no antihyperlipidemic treatment. The current treatment provides no improvement of lipids. Risk factors for coronary artery disease include diabetes mellitus, dyslipidemia, hypertension, obesity and a sedentary lifestyle.  Hypertension  This is a chronic problem. The current episode started more than 1 year ago. The problem is uncontrolled. Associated symptoms include shortness of breath. Pertinent negatives include no chest pain, headaches or palpitations. Risk factors for coronary artery disease include dyslipidemia, diabetes mellitus, family history, obesity, smoking/tobacco exposure and sedentary lifestyle. Past treatments include nothing. The current treatment provides no improvement. Hypertensive end-organ damage includes kidney disease.     Review of Systems  Constitutional: Negative for activity change, chills, fever and unexpected weight change.  HENT: Negative for trouble swallowing and voice change.   Eyes: Negative for visual disturbance.  Respiratory: Positive for shortness of breath. Negative for cough and wheezing.   Cardiovascular: Negative for chest pain, palpitations and leg swelling.  Gastrointestinal: Negative for diarrhea, nausea and vomiting.  Endocrine: Negative for cold intolerance, heat intolerance and polyphagia.  Genitourinary: Positive for frequency. Negative for dysuria and flank pain.  Musculoskeletal: Positive for myalgias. Negative for arthralgias.  Skin: Negative for color change, pallor, rash and wound.  Neurological:  Negative for seizures and headaches.  Psychiatric/Behavioral: Negative for confusion and suicidal ideas.    Objective:    BP (!) 152/89   Pulse 80   Ht 5\' 6"  (1.676 m)   Wt 237 lb (107.5 kg)   BMI 38.25 kg/m   Wt Readings from Last 3 Encounters:  04/22/17 237 lb (107.5 kg)  04/22/17 237 lb (107.5 kg)  04/08/17 238 lb (108 kg)    Physical Exam  Constitutional: She is oriented to person, place, and time. She appears well-developed.  HENT:  Head: Normocephalic and atraumatic.  Eyes: EOM are normal.  Neck: Normal range of motion. Neck supple. No tracheal deviation present. No thyromegaly present.  Cardiovascular: Normal rate and regular rhythm.   Pulmonary/Chest: Effort normal and breath sounds normal.  Abdominal: Soft. Bowel sounds are normal. There is no tenderness. There is no guarding.  Musculoskeletal: Normal range of motion. She exhibits no edema.  Neurological: She is alert and oriented to person, place, and time. She has normal reflexes. No cranial nerve deficit. Coordination normal.  Skin: Skin is warm and dry. No rash noted. No erythema. No pallor.  Psychiatric: She has a normal mood and affect. Judgment normal.  Reluctant affect.    CMP     Component Value Date/Time   NA 139 04/15/2017 1007  K 5.0 04/15/2017 1007   CL 102 04/15/2017 1007   CO2 21 04/15/2017 1007   GLUCOSE 101 (H) 04/15/2017 1007   BUN 16 04/15/2017 1007   CREATININE 1.25 (H) 04/15/2017 1007   CALCIUM 9.6 04/15/2017 1007   PROT 7.2 04/15/2017 1007   ALBUMIN 4.4 04/15/2017 1007   AST 17 04/15/2017 1007   ALT 20 04/15/2017 1007   ALKPHOS 101 04/15/2017 1007   BILITOT 0.5 04/15/2017 1007   GFRNONAA 50 (L) 04/15/2017 1007   GFRAA 58 (L) 04/15/2017 1007     Diabetic Labs (most recent): Lab Results  Component Value Date   HGBA1C 6.0 (H) 04/15/2017   HGBA1C 6.1 (H) 10/08/2016   HGBA1C 6.3 (H) 07/08/2016     Lipid Panel ( most recent) Lipid Panel     Component Value Date/Time   CHOL  188 03/05/2016 1127   TRIG 133 03/05/2016 1127   HDL 35 (L) 03/05/2016 1127   CHOLHDL 5.4 (H) 03/05/2016 1127   LDLCALC 126 (H) 03/05/2016 1127     Assessment & Plan:   1. Uncontrolled type 2 diabetes mellitus without complication, without long-term current use of insulin ([place])  - Patient has currently uncontrolled symptomatic type 2 DM since  51 years of age,  with most recent A1c of  6% improving from 7%. Recent labs reviewed, showing improving renal function.  Her diabetes is complicated by chronic kidney disease, uncontrolled hypertension with intolerance to ACE inhibitors, hyperlipidemia with intolerance to statins, obesity, chronic heavy smoking and patient remains at a high risk for more acute and chronic complications of diabetes which include CAD, CVA, CKD, retinopathy, and neuropathy. These are all discussed in detail with the patient.  - I have counseled the patient on diet management and weight loss, by adopting a carbohydrate restricted/protein rich diet.  - Suggestion is made for patient to avoid simple carbohydrates   from their diet including Cakes , Desserts, Ice Cream,  Soda (  diet and regular) , Sweet Tea , Candies,  Chips, Cookies, Artificial Sweeteners,   and "Sugar-free" Products . This will help patient to have stable blood glucose profile and potentially avoid unintended weight gain.  - I encouraged the patient to switch to  unprocessed or minimally processed complex starch and increased protein intake (animal or plant source), fruits, and vegetables.  - Patient is advised to stick to a routine mealtimes to eat 3 meals  a day and avoid unnecessary snacks ( to snack only to correct hypoglycemia).   - I have approached patient with the following individualized plan to manage diabetes and patient agrees:   - For her type 2 diabetes which is relatively recent onset  and well controlled with A1c of 6% , she will not need any more therapy than metformin. - Due to abnormal  renal function currently, I like to keep the dose lower at 500 mg by mouth once a day after breakfast.  -Patient is not a candidate for  SGLT2 i due to CKD.  - Patient will be considered for incretin therapy if necessary next visit.  - Patient specific target  A1c;  LDL, HDL, Triglycerides, and  Waist Circumference were discussed in detail.  2) BP/HTN:  uncontrolled. She has tolerated the losartan 25 mg daily, however she has not been consistent taking this medication. I have advised her to take it all within the morning. The dose will be increased to 50 mg if she is still has above target blood pressure by next visit.   -  Unfortunately she continues to smoke heavily. Even more important for her is  to quite smoking, which is emphasized heavily during their 3 visit she had with me.  3) Lipids/HPL:  Uncontrolled with LDL 126.   She mentions intolerance to simvastatin. On subsequent visit she'll be tried on low-dose Crestor. 4)  Weight/Diet:  she is obese, CDE Consult has been initiated , exercise, and detailed carbohydrates information provided. She has not initiated any process for bariatric surgery. I gave her a brochure and information with phone numbers.  5) Chronic Care/Health Maintenance:  -Patient is started on ARB and  will be considered for Statin medications and encouraged to continue to follow up with Ophthalmology, Podiatrist at least yearly or according to recommendations, and advised to  Quit smoking. I have recommended yearly flu vaccine and pneumonia vaccination at least every 5 years; moderate intensity exercise for up to 150 minutes weekly; and  sleep for at least 7 hours a day.  - Time spent with the patient: 25 min, of which >50% was spent in reviewing her   current and  previous labs and developing a plan for future management.  - I advised patient to maintain close follow up with Meda Coffee Jennette Banker, MD for primary care needs.  Follow up plan: - Return in 10 weeks with  repeat labs for reevaluation.  Glade Lloyd, MD Phone: [phone]  Fax: [phone]  This note was partially dictated with voice recognition software. Similar sounding words can be transcribed inadequately or may not  be corrected upon review.  04/22/2017, 12:12 PM

## 2017-04-22 NOTE — Progress Notes (Signed)
Diabetes Self-Management Education  Visit Type:  Follow-up  Appt. Start Time: 1030 Appt. End Time: 1100  04/22/2017  Erica Graham, identified by name and date of birth, is a 51 y.o. female with a diagnosis of Diabetes:  .     She notes she is in process of buying a house and is very stressed. Admits to a very stressful life and can't seem to settle down to focus on her health instead of others. Eating only 1-2 meals per day. Still smoking and not ready to quit yet.     Diet is improved in that she is eating more fresh or frozen foods and got rid of all her processed foods. Drinking more water. Can't exercise much due to her fibromyalgia. Sometimes walk but then her joints hurt really bad.    Meds:Metformin 500 mg BID. Sometimes forgets it.     Lost 3 lbs since visit 3 months ago. Needs to cut excess calories and eat more consistently with exercise to lose weight. She is not interested of bariatric surgery as it scares her. SInce she works for herself, she can't be out of work that long. 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  Lab Results  Component Value Date   HGBA1C 6.0 (H) 04/15/2017    ASSESSMENT  Weight 237 lb (107.5 kg). Body mass index is 38.25 kg/m.       Diabetes Self-Management Education - 04/22/17 1052      Health Coping   How would you rate your overall health? Good     Complications   Last HgB A1C per patient/outside source 6 %     Dietary Intake   Breakfast SKips oatmeal,    Lunch Creamed potatoes and okra,  1/2 1/2 lemonad/tea or water   Dinner skiped      Learning Objective:  Patient will have a greater understanding of diabetes self-management. Patient education plan is to attend individual and/or group sessions per assessed needs and concerns.   Plan:   Patient Instructions  Goals 1.  Get back to eating breakfast daily. 2. Drink only water 3.  Make sure lean protein with meals. 4. Add vegetables with lunch and dinner Exercise  when you can.  Don't skip meals.    Expected Outcomes:    Improved blood sugars, weight loss and better self management of co morbidities.  Education material provided: My Plate and Carbohydrate counting sheet  If problems or questions, patient to contact team via:  Phone and Email  Future DSME appointment: -   6 months

## 2017-04-27 ENCOUNTER — Other Ambulatory Visit: Payer: Self-pay | Admitting: "Endocrinology

## 2017-04-27 DIAGNOSIS — E118 Type 2 diabetes mellitus with unspecified complications: Principal | ICD-10-CM

## 2017-04-27 DIAGNOSIS — E1165 Type 2 diabetes mellitus with hyperglycemia: Secondary | ICD-10-CM

## 2017-05-27 DIAGNOSIS — E1165 Type 2 diabetes mellitus with hyperglycemia: Secondary | ICD-10-CM | POA: Diagnosis not present

## 2017-05-27 DIAGNOSIS — N182 Chronic kidney disease, stage 2 (mild): Secondary | ICD-10-CM | POA: Diagnosis not present

## 2017-05-27 DIAGNOSIS — E1122 Type 2 diabetes mellitus with diabetic chronic kidney disease: Secondary | ICD-10-CM | POA: Diagnosis not present

## 2017-05-28 LAB — URINALYSIS, ROUTINE W REFLEX MICROSCOPIC
BILIRUBIN URINE: NEGATIVE
Glucose, UA: NEGATIVE
Hyaline Cast: NONE SEEN /LPF
KETONES UR: NEGATIVE
NITRITE: NEGATIVE
PH: 6 (ref 5.0–8.0)
Protein, ur: NEGATIVE
SPECIFIC GRAVITY, URINE: 1.01 (ref 1.001–1.03)

## 2017-05-28 LAB — CBC
HEMATOCRIT: 42.2 % (ref 35.0–45.0)
HEMOGLOBIN: 14.7 g/dL (ref 11.7–15.5)
MCH: 32.4 pg (ref 27.0–33.0)
MCHC: 34.8 g/dL (ref 32.0–36.0)
MCV: 93 fL (ref 80.0–100.0)
MPV: 11.3 fL (ref 7.5–12.5)
Platelets: 156 10*3/uL (ref 140–400)
RBC: 4.54 10*6/uL (ref 3.80–5.10)
RDW: 12.5 % (ref 11.0–15.0)
WBC: 7.3 10*3/uL (ref 3.8–10.8)

## 2017-05-28 LAB — COMPLETE METABOLIC PANEL WITH GFR
AG RATIO: 1.6 (calc) (ref 1.0–2.5)
ALBUMIN MSPROF: 4.2 g/dL (ref 3.6–5.1)
ALKALINE PHOSPHATASE (APISO): 86 U/L (ref 33–130)
ALT: 20 U/L (ref 6–29)
AST: 15 U/L (ref 10–35)
BILIRUBIN TOTAL: 0.5 mg/dL (ref 0.2–1.2)
BUN / CREAT RATIO: 19 (calc) (ref 6–22)
BUN: 24 mg/dL (ref 7–25)
CO2: 27 mmol/L (ref 20–32)
Calcium: 9.5 mg/dL (ref 8.6–10.4)
Chloride: 105 mmol/L (ref 98–110)
Creat: 1.29 mg/dL — ABNORMAL HIGH (ref 0.50–1.05)
GFR, Est African American: 55 mL/min/{1.73_m2} — ABNORMAL LOW (ref >=60)
GFR, Est Non African American: 48 mL/min/{1.73_m2} — ABNORMAL LOW (ref >=60)
GLOBULIN: 2.6 g/dL (ref 1.9–3.7)
Glucose, Bld: 86 mg/dL (ref 65–99)
POTASSIUM: 5.9 mmol/L — AB (ref 3.5–5.3)
SODIUM: 138 mmol/L (ref 135–146)
Total Protein: 6.8 g/dL (ref 6.1–8.1)

## 2017-05-28 LAB — LIPID PANEL
CHOL/HDL RATIO: 4.8 (calc) (ref <5.0)
CHOLESTEROL: 181 mg/dL (ref <200)
HDL: 38 mg/dL — AB (ref >50)
LDL Cholesterol (Calc): 116 mg/dL (calc) — ABNORMAL HIGH
Non-HDL Cholesterol (Calc): 143 mg/dL (calc) — ABNORMAL HIGH (ref <130)
Triglycerides: 154 mg/dL — ABNORMAL HIGH (ref <150)

## 2017-05-28 LAB — HEMOGLOBIN A1C
EAG (MMOL/L): 6.5 (calc)
Hgb A1c MFr Bld: 5.7 % of total Hgb — ABNORMAL HIGH (ref <5.7)
MEAN PLASMA GLUCOSE: 117 (calc)

## 2017-05-28 LAB — VITAMIN D 25 HYDROXY (VIT D DEFICIENCY, FRACTURES): VIT D 25 HYDROXY: 36 ng/mL (ref 30–100)

## 2017-05-28 LAB — MICROALBUMIN / CREATININE URINE RATIO
Creatinine, Urine: 45 mg/dL (ref 20–275)
MICROALB UR: 0.3 mg/dL
MICROALB/CREAT RATIO: 7 ug/mg{creat} (ref <30)

## 2017-05-31 ENCOUNTER — Encounter: Payer: Self-pay | Admitting: Family Medicine

## 2017-06-02 ENCOUNTER — Other Ambulatory Visit: Payer: BLUE CROSS/BLUE SHIELD | Admitting: Rheumatology

## 2017-06-03 ENCOUNTER — Encounter: Payer: Self-pay | Admitting: Family Medicine

## 2017-06-03 ENCOUNTER — Ambulatory Visit (INDEPENDENT_AMBULATORY_CARE_PROVIDER_SITE_OTHER): Payer: BLUE CROSS/BLUE SHIELD | Admitting: Family Medicine

## 2017-06-03 VITALS — BP 148/90 | HR 76 | Temp 96.9°F | Resp 18 | Ht 66.0 in | Wt 239.0 lb

## 2017-06-03 DIAGNOSIS — I700 Atherosclerosis of aorta: Secondary | ICD-10-CM | POA: Diagnosis not present

## 2017-06-03 DIAGNOSIS — I10 Essential (primary) hypertension: Secondary | ICD-10-CM | POA: Diagnosis not present

## 2017-06-03 DIAGNOSIS — Z23 Encounter for immunization: Secondary | ICD-10-CM

## 2017-06-03 DIAGNOSIS — M797 Fibromyalgia: Secondary | ICD-10-CM | POA: Diagnosis not present

## 2017-06-03 DIAGNOSIS — R768 Other specified abnormal immunological findings in serum: Secondary | ICD-10-CM

## 2017-06-03 DIAGNOSIS — E119 Type 2 diabetes mellitus without complications: Secondary | ICD-10-CM

## 2017-06-03 MED ORDER — PREDNISONE 5 MG PO TABS: 5.0000 mg | ORAL_TABLET | Freq: Every day | ORAL | 0 refills | Status: DC

## 2017-06-03 MED ORDER — DULOXETINE HCL 30 MG PO CPEP: 30.0000 mg | ORAL_CAPSULE | Freq: Every day | ORAL | 3 refills | Status: DC

## 2017-06-03 NOTE — Patient Instructions (Signed)
Continue to try to reduce carbohydrates  Exercise every day that you are able Take the prednisone as directed Try to quit smoking Take the duloxetine once a day See me in one month

## 2017-06-03 NOTE — Progress Notes (Signed)
Chief Complaint  Patient presents with  . Follow-up    6 months   Routine follow up Requests Rx for prednisone Discussed pros and cons of prednisone use, regular and long term use can lead to weight gain, fluid retention, increased blood sugars, osteoporosis She feels it is useful for flares of her joint pain and her granuloma annulare She is given #30 of them with the instruction not to take more than 5 d at a time, 10 a month She is unhappy with the rheumatology consultation she had earlier this year, wants another referral She has well controlled DM with a A1c of 5.7 She has LDL of 116, smokes and has aortic atherosclerosis on x ray.  A statin is recommended.  She refuses, says she had a reaction to lipitor and " I hurt enough already" Wants to discuss treatment for fibromyalgia.  She has been on gabapentin in the past.  She does not know what medicine that might cause her to gain weight.  I recommend duloxetine 30 mg a day.  This may help with fibromyalgia as well as with her osteoarthritis pain.  She will take daily and come see me in a month.  Patient Active Problem List   Diagnosis Date Noted  . Abdominal aortic atherosclerosis ([place]) 06/03/2017  . Primary osteoarthritis of both hands 04/08/2017  . Primary osteoarthritis of both feet 04/08/2017  . Primary osteoarthritis of both knees 04/08/2017  . scoliosis, lumbar region 04/08/2017  . DDD (degenerative disc disease), lumbar 04/08/2017  . Positive ANA (antinuclear antibody) 04/08/2017  . 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 Smoker 03/04/2017  . Rheumatoid arthritis ([place]) 12/03/2016  . Vitamin D deficiency 10/15/2016  . Tubular adenoma of colon 08/21/2016  . Fibromyalgia 05/28/2016  . Type 2 diabetes, HbA1c goal < 7% ([facility]) 04/30/2016  . Mixed hyperlipidemia 04/30/2016  . Essential hypertension, benign 04/30/2016  . Morbid obesity due to excess calories ([place]) 04/30/2016  . Anxiety 03/05/2016  . 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 Vitiligo 03/05/2016    Outpatient Encounter Prescriptions as of 06/03/2017  Medication Sig  . albuterol (PROVENTIL HFA;VENTOLIN HFA) 108 (90 Base) MCG/ACT inhaler Inhale 2 puffs into the lungs every 6 (six) hours as needed for wheezing or shortness of breath.  . Cholecalciferol (VITAMIN D3) 5000 units CAPS Take 1 capsule (5,000 Units total) by mouth daily.  . diphenhydrAMINE (BENADRYL) 25 MG tablet Take 50 mg by mouth at bedtime as needed for sleep.   Marland Kitchen ibuprofen (ADVIL,MOTRIN) 200 MG tablet Take 400 mg by mouth as needed for headache or moderate pain.   Marland Kitchen losartan (COZAAR) 25 MG tablet Take 1 tablet (25 mg total) by mouth daily.  . metFORMIN (GLUCOPHAGE) 500 MG tablet 500 mg daily  . Polyethyl Glycol-Propyl Glycol (SYSTANE OP) Apply 1 drop to eye as needed.   . DULoxetine (CYMBALTA) 30 MG capsule Take 1 capsule (30 mg total) by mouth daily.  . predniSONE (DELTASONE) 5 MG tablet Take 1 tablet (5 mg total) by mouth daily with breakfast.   No facility-administered encounter medications on file as of 06/03/2017.     Allergies  Allergen Reactions  . Lipitor [Atorvastatin] Other (See Comments)    Myalgia and joint pain    Review of Systems  Constitutional: Positive for fatigue. Negative for activity change, appetite change and unexpected weight change.  HENT: Negative for congestion, dental problem, postnasal drip and rhinorrhea.   Eyes: Negative for redness and visual disturbance.  Respiratory: Negative for cough and  shortness of breath.   Cardiovascular: Negative for chest pain, palpitations and leg swelling.  Gastrointestinal: Negative for abdominal pain, constipation and diarrhea.  Genitourinary: Negative for difficulty urinating and frequency.  Musculoskeletal: Positive for back pain, joint swelling and myalgias. Negative for arthralgias.  Skin: Positive for rash.  Neurological: Negative for dizziness and headaches.  Psychiatric/Behavioral: Positive for dysphoric mood. Negative  for sleep disturbance. The patient is not nervous/anxious.        Stress from moving    BP (!) 148/90 (BP Location: Right Arm, Patient Position: Sitting, Cuff Size: Normal)   Pulse 76   Temp (!) 96.9 F (36.1 C) (Temporal)   Resp 18   Ht 5\' 6"  (1.676 m)   Wt 239 lb 0.6 oz (108.4 kg)   SpO2 99%   BMI 38.58 kg/m   Physical Exam  Constitutional: She is oriented to person, place, and time. She appears well-developed and well-nourished.  Morbidly obese. Pleasant and cooperative.  HENT:  Head: Normocephalic and atraumatic.  Right Ear: External ear normal.  Left Ear: External ear normal.  Mouth/Throat: Oropharynx is clear and moist.  Eyes: Pupils are equal, round, and reactive to light. Conjunctivae are normal.  Neck: Normal range of motion. Neck supple. No thyromegaly present.  Cardiovascular: Normal rate, regular rhythm and normal heart sounds.   Pulmonary/Chest: Effort normal and breath sounds normal. No respiratory distress.  Abdominal: Soft. Bowel sounds are normal.  Musculoskeletal: Normal range of motion. She exhibits no edema.  No swelling or synovitis of joints.  Lymphadenopathy:    She has no cervical adenopathy.  Neurological: She is alert and oriented to person, place, and time.  Gait normal  Skin: Skin is warm and dry. Rash noted.  Inflamed erythematous papules elbows and hands  Psychiatric: She has a normal mood and affect. Her behavior is normal. Thought content normal.    ASSESSMENT/PLAN:  1. Abdominal aortic atherosclerosis ([place]) On x ray.  Shown to patient  2. Essential hypertension, benign  3. Type 2 diabetes, HbA1c goal < 7% ([facility]) - CBC - COMPLETE METABOLIC PANEL WITH GFR - Hemoglobin A1c - Lipid panel - Urinalysis, Routine w reflex microscopic  4. Fibromyalgia - Ambulatory referral to Rheumatology  5. Need for pneumococcal vaccination - Pneumococcal polysaccharide vaccine 23-valent greater than or equal to 2yo subcutaneous/IM  6. Positive ANA  (antinuclear antibody) - Ambulatory referral to Rheumatology   Patient Instructions  Continue to try to reduce carbohydrates  Exercise every day that you are able Take the prednisone as directed Try to quit smoking Take the duloxetine once a day See me in one month    Raylene Everts, MD

## 2017-06-09 ENCOUNTER — Encounter: Payer: Self-pay | Admitting: Family Medicine

## 2017-07-15 ENCOUNTER — Other Ambulatory Visit: Payer: Self-pay

## 2017-07-15 ENCOUNTER — Ambulatory Visit: Payer: BLUE CROSS/BLUE SHIELD | Admitting: Family Medicine

## 2017-07-15 ENCOUNTER — Encounter: Payer: Self-pay | Admitting: Family Medicine

## 2017-07-15 VITALS — BP 128/80 | HR 76 | Temp 97.5°F | Resp 18 | Ht 66.0 in | Wt 243.0 lb

## 2017-07-15 DIAGNOSIS — R7303 Prediabetes: Secondary | ICD-10-CM | POA: Diagnosis not present

## 2017-07-15 DIAGNOSIS — M797 Fibromyalgia: Secondary | ICD-10-CM

## 2017-07-15 DIAGNOSIS — I10 Essential (primary) hypertension: Secondary | ICD-10-CM | POA: Diagnosis not present

## 2017-07-15 DIAGNOSIS — E119 Type 2 diabetes mellitus without complications: Secondary | ICD-10-CM | POA: Diagnosis not present

## 2017-07-15 MED ORDER — PREDNISONE 5 MG PO TABS: 5.0000 mg | ORAL_TABLET | Freq: Every day | ORAL | 1 refills | Status: DC

## 2017-07-15 NOTE — Patient Instructions (Signed)
Call for refills or problems Try to stay active Take the prednisone as needed Resist taking daily  Need labs and follow up in 6 months

## 2017-07-15 NOTE — Progress Notes (Signed)
Chief Complaint  Patient presents with  . Follow-up    1 month  Patient is here for follow-up.  At her last visit she was started on Cymbalta.  She has been taking this 1 a day.  She reports no side effects.  She reports a definite improvement in her pain level.  She also finds a definite improvement in her mood and ability to cope.  She will stay on the same medication and let me know if there is any problems. She was given prednisone to take as needed.  She does feel better when she is on prednisone.  I gave her 30 pills with the specific instructions to take these as needed.  I said she could take them in 5 days sports, no more than once or twice a month.  Apparently, she misunderstood these instructions, and took 1 a day every day until she ran out.  She loves it, and states she feels great.  I had a long discussion with her that this is not an appropriate long-term medication, and that I will refill another 30 with the expectation that it will last 3 months, and she knows will not be refilled prior to 3 months. Patient has prediabetes.  Her hemoglobin A1c in October was 5.7.  We reviewed diet and exercise recommendations. She gained a few pounds since her last visit.  This may be due to the prednisone.  She is reminded that her obesity affects her health, her medical problems, her longevity.  Patient Active Problem List   Diagnosis Date Noted  . Abdominal aortic atherosclerosis ([place]) 06/03/2017  . Primary osteoarthritis of both hands 04/08/2017  . Primary osteoarthritis of both feet 04/08/2017  . Primary osteoarthritis of both knees 04/08/2017  . scoliosis, lumbar region 04/08/2017  . DDD (degenerative disc disease), lumbar 04/08/2017  . Positive ANA (antinuclear antibody) 04/08/2017  . 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 Smoker 03/04/2017  . Rheumatoid arthritis ([place]) 12/03/2016  . Vitamin D deficiency 10/15/2016  . Tubular adenoma of colon 08/21/2016  . Fibromyalgia 05/28/2016  .  Type 2 diabetes, HbA1c goal < 7% (HCC) 04/30/2016  . Mixed hyperlipidemia 04/30/2016  . Essential hypertension, benign 04/30/2016  . Morbid obesity due to excess calories ([place]) 04/30/2016  . Anxiety 03/05/2016  . 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 Vitiligo 03/05/2016    Outpatient Encounter Medications as of 07/15/2017  Medication Sig  . albuterol (PROVENTIL HFA;VENTOLIN HFA) 108 (90 Base) MCG/ACT inhaler Inhale 2 puffs into the lungs every 6 (six) hours as needed for wheezing or shortness of breath.  . Cholecalciferol (VITAMIN D3) 5000 units CAPS Take 1 capsule (5,000 Units total) by mouth daily.  . diphenhydrAMINE (BENADRYL) 25 MG tablet Take 50 mg by mouth at bedtime as needed for sleep.   . DULoxetine (CYMBALTA) 30 MG capsule Take 1 capsule (30 mg total) by mouth daily.  Marland Kitchen ibuprofen (ADVIL,MOTRIN) 200 MG tablet Take 400 mg by mouth as needed for headache or moderate pain.   Marland Kitchen losartan (COZAAR) 25 MG tablet Take 1 tablet (25 mg total) by mouth daily.  . metFORMIN (GLUCOPHAGE) 500 MG tablet 500 mg daily  . Polyethyl Glycol-Propyl Glycol (SYSTANE OP) Apply 1 drop to eye as needed.   . predniSONE (DELTASONE) 5 MG tablet Take 1 tablet (5 mg total) by mouth daily with breakfast.  . [DISCONTINUED] predniSONE (DELTASONE) 5 MG tablet Take 1 tablet (5 mg total) by mouth daily with breakfast. (Patient not taking: Reported on 07/15/2017)  No facility-administered encounter medications on file as of 07/15/2017.     Allergies  Allergen Reactions  . Lipitor [Atorvastatin] Other (See Comments)    Myalgia and joint pain    Review of Systems  Constitutional: Negative for activity change, appetite change, fatigue and unexpected weight change.  HENT: Negative for congestion, dental problem, postnasal drip and rhinorrhea.   Eyes: Negative for redness and visual disturbance.  Respiratory: Negative for cough and shortness of breath.   Cardiovascular: Negative for chest pain, palpitations and leg  swelling.  Gastrointestinal: Negative for abdominal pain, constipation and diarrhea.  Genitourinary: Negative for difficulty urinating and frequency.  Musculoskeletal: Positive for back pain, joint swelling and myalgias. Negative for arthralgias.       Improved  Skin: Positive for rash.  Neurological: Negative for dizziness and headaches.  Psychiatric/Behavioral: Negative for dysphoric mood and sleep disturbance. The patient is not nervous/anxious.        Improved    BP 128/80 (BP Location: Left Arm, Patient Position: Sitting, Cuff Size: Large)   Pulse 76   Temp (!) 97.5 F (36.4 C) (Temporal)   Resp 18   Ht 5\' 6"  (1.676 m)   Wt 243 lb (110.2 kg)   SpO2 98%   BMI 39.22 kg/m   Physical Exam  Constitutional: She is oriented to person, place, and time. She appears well-developed and well-nourished.  Morbidly obese. Pleasant and cooperative.  HENT:  Head: Normocephalic and atraumatic.  Right Ear: External ear normal.  Left Ear: External ear normal.  Mouth/Throat: Oropharynx is clear and moist.  Eyes: Conjunctivae are normal. Pupils are equal, round, and reactive to light.  Neck: Normal range of motion. Neck supple. No thyromegaly present.  Cardiovascular: Normal rate, regular rhythm and normal heart sounds.  Pulmonary/Chest: Effort normal and breath sounds normal. No respiratory distress.  Abdominal: Soft. Bowel sounds are normal.  Musculoskeletal: Normal range of motion. She exhibits no edema.  No swelling or synovitis of joints.  Lymphadenopathy:    She has no cervical adenopathy.  Neurological: She is alert and oriented to person, place, and time.  Gait normal  Skin: Skin is warm and dry. Rash noted.  Inflamed erythematous papules elbows and hands, less apparent today  Psychiatric: She has a normal mood and affect. Her behavior is normal. Thought content normal.    ASSESSMENT/PLAN:  1. Type 2 diabetes, HbA1c goal < 7% (HCC) Controlled  2. Fibromyalgia Improved on  Cymbalta  3. Essential hypertension, benign Controlled  4. Prediabetes Labs due in 6 months - CBC - COMPLETE METABOLIC PANEL WITH GFR - Hemoglobin A1c - Lipid panel - Urinalysis, Routine w reflex microscopic - Microalbumin / creatinine urine ratio   Patient Instructions  Call for refills or problems Try to stay active Take the prednisone as needed Resist taking daily  Need labs and follow up in 6 months   Raylene Everts, MD

## 2017-10-07 ENCOUNTER — Ambulatory Visit: Payer: BLUE CROSS/BLUE SHIELD | Admitting: Rheumatology

## 2017-10-21 ENCOUNTER — Ambulatory Visit: Payer: BLUE CROSS/BLUE SHIELD | Admitting: "Endocrinology

## 2017-10-21 ENCOUNTER — Ambulatory Visit: Payer: BLUE CROSS/BLUE SHIELD | Admitting: Nutrition

## 2018-01-13 ENCOUNTER — Ambulatory Visit: Payer: BLUE CROSS/BLUE SHIELD | Admitting: Family Medicine

## 2018-01-14 ENCOUNTER — Encounter: Payer: Self-pay | Admitting: Family Medicine

## 2018-03-03 ENCOUNTER — Ambulatory Visit: Payer: BLUE CROSS/BLUE SHIELD | Admitting: Family Medicine

## 2019-03-06 ENCOUNTER — Telehealth: Payer: Self-pay | Admitting: Adult Health

## 2019-03-06 NOTE — Telephone Encounter (Signed)
Pt was seeing Dr. Meda Coffee but she moved to [place]. Pt wants you to advise on another PCP. She would still like to talk to you. Thanks!! [place]

## 2019-03-06 NOTE — Telephone Encounter (Signed)
Patient called stating that she has a question for [facility], Patient did not state the reason for the call. Please contact pt

## 2019-03-07 NOTE — Telephone Encounter (Signed)
Pt needs PCP, to check with Dr Juel Burrow office and needs pap, make appt

## 2019-12-14 ENCOUNTER — Ambulatory Visit: Payer: BLUE CROSS/BLUE SHIELD | Admitting: Family Medicine

## 2021-08-05 ENCOUNTER — Encounter (INDEPENDENT_AMBULATORY_CARE_PROVIDER_SITE_OTHER): Payer: Self-pay | Admitting: *Deleted

## 2023-11-07 ENCOUNTER — Encounter (HOSPITAL_COMMUNITY): Payer: Self-pay

## 2023-11-07 ENCOUNTER — Emergency Department (HOSPITAL_COMMUNITY)

## 2023-11-07 ENCOUNTER — Other Ambulatory Visit: Payer: Self-pay

## 2023-11-07 ENCOUNTER — Inpatient Hospital Stay (HOSPITAL_COMMUNITY)
Admission: EM | Admit: 2023-11-07 | Discharge: 2023-11-10 | DRG: 065 | Disposition: A | Discharge status: Home / Self Care | Source: Other Acute Inpatient Hospital | Attending: Internal Medicine | Admitting: Internal Medicine

## 2023-11-07 DIAGNOSIS — E871 Hypo-osmolality and hyponatremia: Secondary | ICD-10-CM | POA: Diagnosis present

## 2023-11-07 DIAGNOSIS — N189 Chronic kidney disease, unspecified: Secondary | ICD-10-CM | POA: Diagnosis not present

## 2023-11-07 DIAGNOSIS — Z8249 Family history of ischemic heart disease and other diseases of the circulatory system: Secondary | ICD-10-CM

## 2023-11-07 DIAGNOSIS — I2510 Atherosclerotic heart disease of native coronary artery without angina pectoris: Secondary | ICD-10-CM | POA: Diagnosis present

## 2023-11-07 DIAGNOSIS — R471 Dysarthria and anarthria: Secondary | ICD-10-CM | POA: Diagnosis present

## 2023-11-07 DIAGNOSIS — I63542 Cerebral infarction due to unspecified occlusion or stenosis of left cerebellar artery: Principal | ICD-10-CM | POA: Diagnosis present

## 2023-11-07 DIAGNOSIS — E119 Type 2 diabetes mellitus without complications: Secondary | ICD-10-CM

## 2023-11-07 DIAGNOSIS — I472 Ventricular tachycardia, unspecified: Secondary | ICD-10-CM | POA: Diagnosis not present

## 2023-11-07 DIAGNOSIS — E860 Dehydration: Secondary | ICD-10-CM | POA: Diagnosis present

## 2023-11-07 DIAGNOSIS — R29701 NIHSS score 1: Secondary | ICD-10-CM | POA: Diagnosis present

## 2023-11-07 DIAGNOSIS — N1831 Chronic kidney disease, stage 3a: Secondary | ICD-10-CM | POA: Diagnosis present

## 2023-11-07 DIAGNOSIS — J45909 Unspecified asthma, uncomplicated: Secondary | ICD-10-CM | POA: Diagnosis present

## 2023-11-07 DIAGNOSIS — R4701 Aphasia: Secondary | ICD-10-CM | POA: Diagnosis not present

## 2023-11-07 DIAGNOSIS — R4182 Altered mental status, unspecified: Secondary | ICD-10-CM | POA: Diagnosis not present

## 2023-11-07 DIAGNOSIS — E785 Hyperlipidemia, unspecified: Secondary | ICD-10-CM | POA: Diagnosis present

## 2023-11-07 DIAGNOSIS — I129 Hypertensive chronic kidney disease with stage 1 through stage 4 chronic kidney disease, or unspecified chronic kidney disease: Secondary | ICD-10-CM | POA: Diagnosis present

## 2023-11-07 DIAGNOSIS — E1165 Type 2 diabetes mellitus with hyperglycemia: Secondary | ICD-10-CM | POA: Diagnosis not present

## 2023-11-07 DIAGNOSIS — Z7902 Long term (current) use of antithrombotics/antiplatelets: Secondary | ICD-10-CM

## 2023-11-07 DIAGNOSIS — I639 Cerebral infarction, unspecified: Principal | ICD-10-CM | POA: Diagnosis present

## 2023-11-07 DIAGNOSIS — N179 Acute kidney failure, unspecified: Secondary | ICD-10-CM

## 2023-11-07 DIAGNOSIS — I10 Essential (primary) hypertension: Secondary | ICD-10-CM | POA: Insufficient documentation

## 2023-11-07 DIAGNOSIS — I672 Cerebral atherosclerosis: Secondary | ICD-10-CM | POA: Diagnosis not present

## 2023-11-07 DIAGNOSIS — R4702 Dysphasia: Secondary | ICD-10-CM | POA: Diagnosis present

## 2023-11-07 DIAGNOSIS — E872 Acidosis, unspecified: Secondary | ICD-10-CM | POA: Diagnosis present

## 2023-11-07 DIAGNOSIS — R413 Other amnesia: Secondary | ICD-10-CM | POA: Diagnosis not present

## 2023-11-07 DIAGNOSIS — Z7984 Long term (current) use of oral hypoglycemic drugs: Secondary | ICD-10-CM

## 2023-11-07 DIAGNOSIS — Z6841 Body Mass Index (BMI) 40.0 and over, adult: Secondary | ICD-10-CM

## 2023-11-07 DIAGNOSIS — M797 Fibromyalgia: Secondary | ICD-10-CM | POA: Diagnosis present

## 2023-11-07 DIAGNOSIS — I6523 Occlusion and stenosis of bilateral carotid arteries: Secondary | ICD-10-CM | POA: Diagnosis not present

## 2023-11-07 DIAGNOSIS — F17210 Nicotine dependence, cigarettes, uncomplicated: Secondary | ICD-10-CM | POA: Diagnosis present

## 2023-11-07 DIAGNOSIS — E1122 Type 2 diabetes mellitus with diabetic chronic kidney disease: Secondary | ICD-10-CM | POA: Diagnosis present

## 2023-11-07 DIAGNOSIS — I6501 Occlusion and stenosis of right vertebral artery: Secondary | ICD-10-CM | POA: Diagnosis not present

## 2023-11-07 DIAGNOSIS — Z7982 Long term (current) use of aspirin: Secondary | ICD-10-CM

## 2023-11-07 DIAGNOSIS — I700 Atherosclerosis of aorta: Secondary | ICD-10-CM | POA: Diagnosis not present

## 2023-11-07 DIAGNOSIS — Z825 Family history of asthma and other chronic lower respiratory diseases: Secondary | ICD-10-CM

## 2023-11-07 DIAGNOSIS — Z833 Family history of diabetes mellitus: Secondary | ICD-10-CM

## 2023-11-07 HISTORY — DX: Cerebral infarction, unspecified: I63.9

## 2023-11-07 LAB — CBC WITH DIFFERENTIAL/PLATELET
Abs Immature Granulocytes: 0.03 10*3/uL (ref 0.00–0.07)
Basophils Absolute: 0.1 10*3/uL (ref 0.0–0.1)
Basophils Relative: 1 %
Eosinophils Absolute: 0 10*3/uL (ref 0.0–0.5)
Eosinophils Relative: 0 %
HCT: 46.9 % — ABNORMAL HIGH (ref 36.0–46.0)
Hemoglobin: 15.9 g/dL — ABNORMAL HIGH (ref 12.0–15.0)
Immature Granulocytes: 0 %
Lymphocytes Relative: 18 %
Lymphs Abs: 1.6 10*3/uL (ref 0.7–4.0)
MCH: 31.1 pg (ref 26.0–34.0)
MCHC: 33.9 g/dL (ref 30.0–36.0)
MCV: 91.8 fL (ref 80.0–100.0)
Monocytes Absolute: 0.6 10*3/uL (ref 0.1–1.0)
Monocytes Relative: 7 %
Neutro Abs: 6.7 10*3/uL (ref 1.7–7.7)
Neutrophils Relative %: 74 %
Platelets: 179 10*3/uL (ref 150–400)
RBC: 5.11 MIL/uL (ref 3.87–5.11)
RDW: 13.4 % (ref 11.5–15.5)
WBC: 9.1 10*3/uL (ref 4.0–10.5)
nRBC: 0 % (ref 0.0–0.2)

## 2023-11-07 LAB — COMPREHENSIVE METABOLIC PANEL WITH GFR
ALT: 25 U/L (ref 0–44)
AST: 18 U/L (ref 15–41)
Albumin: 3.5 g/dL (ref 3.5–5.0)
Alkaline Phosphatase: 96 U/L (ref 38–126)
Anion gap: 11 (ref 5–15)
BUN: 35 mg/dL — ABNORMAL HIGH (ref 6–20)
CO2: 18 mmol/L — ABNORMAL LOW (ref 22–32)
Calcium: 8.8 mg/dL — ABNORMAL LOW (ref 8.9–10.3)
Chloride: 102 mmol/L (ref 98–111)
Creatinine, Ser: 2.15 mg/dL — ABNORMAL HIGH (ref 0.44–1.00)
GFR, Estimated: 26 mL/min — ABNORMAL LOW (ref >60)
Glucose, Bld: 155 mg/dL — ABNORMAL HIGH (ref 70–99)
Potassium: 4.2 mmol/L (ref 3.5–5.1)
Sodium: 131 mmol/L — ABNORMAL LOW (ref 135–145)
Total Bilirubin: 0.8 mg/dL (ref 0.0–1.2)
Total Protein: 7.4 g/dL (ref 6.5–8.1)

## 2023-11-07 LAB — PROTIME-INR
INR: 1 (ref 0.8–1.2)
Prothrombin Time: 13.7 s (ref 11.4–15.2)

## 2023-11-07 MED ORDER — ACETAMINOPHEN 325 MG PO TABS
650.0000 mg | ORAL_TABLET | ORAL | Status: DC | PRN
  Administered 2023-11-09: 650 mg via ORAL
  Filled 2023-11-07: qty 2

## 2023-11-07 MED ORDER — SODIUM CHLORIDE 0.9 % IV BOLUS
1000.0000 mL | Freq: Once | INTRAVENOUS | Status: AC
  Administered 2023-11-07: 1000 mL via INTRAVENOUS

## 2023-11-07 MED ORDER — ASPIRIN 325 MG PO TABS
325.0000 mg | ORAL_TABLET | Freq: Every day | ORAL | Status: DC
  Administered 2023-11-07: 325 mg via ORAL
  Filled 2023-11-07 (×2): qty 1

## 2023-11-07 MED ORDER — SODIUM CHLORIDE 0.9 % IV SOLN: INTRAVENOUS | Status: DC

## 2023-11-07 MED ORDER — ALBUTEROL SULFATE HFA 108 (90 BASE) MCG/ACT IN AERS: 2.0000 | INHALATION_SPRAY | Freq: Four times a day (QID) | RESPIRATORY_TRACT | Status: DC | PRN

## 2023-11-07 MED ORDER — ACETAMINOPHEN 160 MG/5ML PO SOLN: 650.0000 mg | ORAL | Status: DC | PRN

## 2023-11-07 MED ORDER — SENNOSIDES-DOCUSATE SODIUM 8.6-50 MG PO TABS: 1.0000 | ORAL_TABLET | Freq: Every evening | ORAL | Status: DC | PRN

## 2023-11-07 MED ORDER — STROKE: EARLY STAGES OF RECOVERY BOOK
Freq: Once | Status: DC
  Filled 2023-11-07: qty 1

## 2023-11-07 MED ORDER — IOHEXOL 350 MG/ML SOLN
60.0000 mL | Freq: Once | INTRAVENOUS | Status: AC | PRN
  Administered 2023-11-07: 60 mL via INTRAVENOUS

## 2023-11-07 MED ORDER — ASPIRIN 325 MG PO TABS: 325.0000 mg | ORAL_TABLET | Freq: Every day | ORAL | Status: DC

## 2023-11-07 MED ORDER — CLOPIDOGREL BISULFATE 75 MG PO TABS
300.0000 mg | ORAL_TABLET | Freq: Once | ORAL | Status: AC
  Administered 2023-11-07: 300 mg via ORAL
  Filled 2023-11-07: qty 4

## 2023-11-07 MED ORDER — ONDANSETRON HCL 4 MG/2ML IJ SOLN: 4.0000 mg | INTRAMUSCULAR | Status: DC | PRN

## 2023-11-07 MED ORDER — ENOXAPARIN SODIUM 40 MG/0.4ML IJ SOSY
40.0000 mg | PREFILLED_SYRINGE | INTRAMUSCULAR | Status: DC
  Administered 2023-11-08 – 2023-11-09 (×2): 40 mg via SUBCUTANEOUS
  Filled 2023-11-07 (×2): qty 0.4

## 2023-11-07 MED ORDER — VITAMIN D3 25 MCG (1000 UNIT) PO TABS
5000.0000 [IU] | ORAL_TABLET | Freq: Every day | ORAL | Status: DC
  Administered 2023-11-08 – 2023-11-10 (×3): 5000 [IU] via ORAL
  Filled 2023-11-07 (×4): qty 5

## 2023-11-07 MED ORDER — ACETAMINOPHEN 650 MG RE SUPP: 650.0000 mg | RECTAL | Status: DC | PRN

## 2023-11-07 MED ORDER — LOSARTAN POTASSIUM 25 MG PO TABS
25.0000 mg | ORAL_TABLET | Freq: Every day | ORAL | Status: DC
Start: 2023-11-08 — End: 2023-11-08

## 2023-11-07 MED ORDER — TRAZODONE HCL 50 MG PO TABS: 25.0000 mg | ORAL_TABLET | Freq: Every evening | ORAL | Status: DC | PRN

## 2023-11-07 MED ORDER — DULOXETINE HCL 30 MG PO CPEP: 30.0000 mg | ORAL_CAPSULE | Freq: Every day | ORAL | Status: DC

## 2023-11-07 MED ORDER — POLYETHYL GLYCOL-PROPYL GLYCOL 0.4-0.3 % OP GEL: OPHTHALMIC | Status: DC | PRN

## 2023-11-07 NOTE — ED Notes (Signed)
 [place] EDP made aware of pt condition

## 2023-11-07 NOTE — ED Triage Notes (Signed)
 Pt son states that yesterday pt started acting abnormal and had some slurred speech. States she was very dissociative. Pt is A&O x4 and no new weakness. Pt able to form normal sentences. Denies headache. No facial droop.  Pt hypertensive 183/107

## 2023-11-07 NOTE — ED Provider Notes (Signed)
 [facility] EMERGENCY DEPARTMENT AT [facility] Provider Note   CSN: [id number] Arrival date & time: 11/07/23  2025     History {Add pertinent medical, surgical, social history, OB history to HPI:1} Chief Complaint  Patient presents with   Aphasia   Altered Mental Status    Erica Graham is a 58 y.o. female.  Patient has a history of diabetes and hypertension.  According to her son she seemed to be not her normal self yesterday.  She was understanding things correctly and was staring off into space.  Today she complains of not able to get the words out.  The history is provided by the patient and medical records. No language interpreter was used.  Altered Mental Status Severity:  Mild Most recent episode:  Today Episode history:  Continuous Timing:  Constant Progression:  Waxing and waning Chronicity:  New Context: not alcohol use   Associated symptoms: no abdominal pain, no hallucinations, no headaches, no rash and no seizures        Home Medications Prior to Admission medications   Medication Sig Start Date End Date Taking? Authorizing Provider  albuterol (PROVENTIL HFA;VENTOLIN HFA) 108 (90 Base) MCG/ACT inhaler Inhale 2 puffs into the lungs every 6 (six) hours as needed for wheezing or shortness of breath. 05/28/16   Eustace Moore, MD  Cholecalciferol (VITAMIN D3) 5000 units CAPS Take 1 capsule (5,000 Units total) by mouth daily. 07/16/16   Roma Kayser, MD  diphenhydrAMINE (BENADRYL) 25 MG tablet Take 50 mg by mouth at bedtime as needed for sleep.     [provider]  DULoxetine (CYMBALTA) 30 MG capsule Take 1 capsule (30 mg total) by mouth daily. 06/03/17   Eustace Moore, MD  ibuprofen (ADVIL,MOTRIN) 200 MG tablet Take 400 mg by mouth as needed for headache or moderate pain.     [provider]  losartan (COZAAR) 25 MG tablet Take 1 tablet (25 mg total) by mouth daily. 12/03/16   Eustace Moore, MD  metFORMIN  (GLUCOPHAGE) 500 MG tablet 500 mg daily 12/03/16   Eustace Moore, MD  Polyethyl Glycol-Propyl Glycol (SYSTANE OP) Apply 1 drop to eye as needed.     [provider]  predniSONE (DELTASONE) 5 MG tablet Take 1 tablet (5 mg total) by mouth daily with breakfast. 07/15/17   Eustace Moore, MD      Allergies    Lipitor [atorvastatin]    Review of Systems   Review of Systems  Constitutional:  Negative for appetite change and fatigue.  HENT:  Negative for congestion, ear discharge and sinus pressure.   Eyes:  Negative for discharge.  Respiratory:  Negative for cough.   Cardiovascular:  Negative for chest pain.  Gastrointestinal:  Negative for abdominal pain and diarrhea.  Genitourinary:  Negative for frequency and hematuria.  Musculoskeletal:  Negative for back pain.  Skin:  Negative for rash.  Neurological:  Negative for seizures and headaches.       Difficulty getting the words out that she wants to say.  Not slurred speech  Psychiatric/Behavioral:  Negative for hallucinations.     Physical Exam Updated Vital Signs BP (!) 178/77   Pulse 89   Resp 19   Wt 108.9 kg   SpO2 97%   BMI 38.74 kg/m  Physical Exam Vitals and nursing note reviewed.  Constitutional:      Appearance: She is well-developed.  HENT:     Head: Normocephalic.     Nose:  Nose normal.  Eyes:     General: No scleral icterus.    Conjunctiva/sclera: Conjunctivae normal.  Neck:     Thyroid: No thyromegaly.  Cardiovascular:     Rate and Rhythm: Normal rate and regular rhythm.     Heart sounds: No murmur heard.    No friction rub. No gallop.  Pulmonary:     Breath sounds: No stridor. No wheezing or rales.  Chest:     Chest wall: No tenderness.  Abdominal:     General: There is no distension.     Tenderness: There is no abdominal tenderness. There is no rebound.  Musculoskeletal:        General: Normal range of motion.     Cervical back: Neck supple.  Lymphadenopathy:     Cervical: No  cervical adenopathy.  Skin:    Findings: No erythema or rash.  Neurological:     Mental Status: She is alert and oriented to person, place, and time.     Motor: No abnormal muscle tone.     Coordination: Coordination normal.     Comments: Patient states that the words she wants to say she has a hard time getting out and seeing them  Psychiatric:        Behavior: Behavior normal.     ED Results / Procedures / Treatments   Labs (all labs ordered are listed, but only abnormal results are displayed) Labs Reviewed  CBC WITH DIFFERENTIAL/PLATELET - Abnormal; Notable for the following components:      Result Value   Hemoglobin 15.9 (*)    HCT 46.9 (*)    All other components within normal limits  COMPREHENSIVE METABOLIC PANEL WITH GFR - Abnormal; Notable for the following components:   Sodium 131 (*)    CO2 18 (*)    Glucose, Bld 155 (*)    BUN 35 (*)    Creatinine, Ser 2.15 (*)    Calcium 8.8 (*)    GFR, Estimated 26 (*)    All other components within normal limits  PROTIME-INR    EKG None  Radiology CT ANGIO HEAD NECK W WO CM Result Date: 11/07/2023 CLINICAL DATA:  Stroke/TIA, determine embolic source EXAM: CT ANGIOGRAPHY HEAD AND NECK WITH AND WITHOUT CONTRAST TECHNIQUE: Multidetector CT imaging of the head and neck was performed using the standard protocol during bolus administration of intravenous contrast. Multiplanar CT image reconstructions and MIPs were obtained to evaluate the vascular anatomy. Carotid stenosis measurements (when applicable) are obtained utilizing NASCET criteria, using the distal internal carotid diameter as the denominator. RADIATION DOSE REDUCTION: This exam was performed according to the departmental dose-optimization program which includes automated exposure control, adjustment of the mA and/or kV according to patient size and/or use of iterative reconstruction technique. CONTRAST:  60mL OMNIPAQUE IOHEXOL 350 MG/ML SOLN COMPARISON:  CT head from earlier  today. FINDINGS: CTA NECK FINDINGS Aortic arch: Aortic atherosclerosis. Great vessel origins are patent. Motion limits evaluation for stenosis with probable moderate stenosis of the left proximal subclavian artery. Right carotid system: Atherosclerosis at the carotid bifurcation without greater than 50% stenosis. Left carotid system: Extensive atherosclerosis of the common carotid artery with multifocal moderate to severe stenosis. Critically stenotic and nearly occlusive left ICA origin with only a tiny stream of contrast visible. More distal ICA in the neck is small, but remains patent. Vertebral arteries: Right dominant. Severe stenosis of the right vertebral artery origin. Left vertebral artery is patent without definite high-grade stenosis although evaluation at the origin is  limited due to motion. Skeleton: No acute abnormality on limited assessment. Other neck: No acute abnormality on limited assessment. Upper chest: Visualized lung apices are clear. Review of the MIP images confirms the above findings CTA HEAD FINDINGS Anterior circulation: Small left ICA. Severe bilateral paraclinoid ICA stenosis. Bilateral MCAs are patent without proximal high-grade stenosis. 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 Right A1 ACA and bilateral A2 ACAs are patent proximally. Limited distal evaluation due to motion. Posterior circulation: Bilateral intradural vertebral arteries, basilar artery and bilateral posterior cerebral arteries are patent without proximal high-grade stenosis. Atherosclerotic irregularity of the PCAs bilaterally. Venous sinuses: As permitted by contrast timing, patent. Review of the MIP images confirms the above findings IMPRESSION: 1. Critically stenotic and nearly occlusive left ICA origin with only a tiny stream of contrast visible. More distal ICA is small, but remains patent. 2. Severe bilateral paraclinoid ICA stenosis. 3. Severe right vertebral artery origin stenosis. 4. Extensive  atherosclerosis of the left common carotid artery with multifocal moderate to severe stenosis. 5. Probable moderate stenosis of the left subclavian artery, although motion limits assessment. 6.  Aortic Atherosclerosis (ICD10-I70.0). Findings discussed with Dr. Estell Harpin via telephone at 11:04 PM. Electronically Signed   By: Feliberto Harts M.D.   On: 11/07/2023 23:06   CT Head Wo Contrast Result Date: 11/07/2023 CLINICAL DATA:  Altered mental status, aphasia, memory loss, slurred speech EXAM: CT HEAD WITHOUT CONTRAST TECHNIQUE: Contiguous axial images were obtained from the base of the skull through the vertex without intravenous contrast. RADIATION DOSE REDUCTION: This exam was performed according to the departmental dose-optimization program which includes automated exposure control, adjustment of the mA and/or kV according to patient size and/or use of iterative reconstruction technique. COMPARISON:  None Available. FINDINGS: Brain: Hypoattenuation within the superior left frontal lobe near the vertex (circa series 2/image 25 and 4/31) concerning for acute or subacute infarcts. Additional foci of hypoattenuation in the left basal ganglia (2/17 and 2/15). Vascular: No hyperdense vessel or unexpected calcification. Skull: Normal. Negative for fracture or focal lesion. Sinuses/Orbits: Moderate opacification of the mastoid air cells bilaterally. The paranasal sinuses are well aerated. Other: None. IMPRESSION: Multiple foci of hypoattenuation and left frontal lobe and basal ganglia concerning for acute or subacute infarcts. Critical Value/emergent results were called by telephone at the time of interpretation on 11/07/2023 at 9:35 pm to provider [facility] Kaede Clendenen , who verbally acknowledged these results. Electronically Signed   By: Minerva Fester M.D.   On: 11/07/2023 21:38    Procedures Procedures  {Document cardiac monitor, telemetry assessment procedure when appropriate:1}  Medications Ordered in  ED Medications  aspirin tablet 325 mg (325 mg Oral Given 11/07/23 2316)  clopidogrel (PLAVIX) tablet 300 mg (300 mg Oral Given 11/07/23 2211)  sodium chloride 0.9 % bolus 1,000 mL (1,000 mLs Intravenous New Bag/Given 11/07/23 2214)  iohexol (OMNIPAQUE) 350 MG/ML injection 60 mL (60 mLs Intravenous Contrast Given 11/07/23 2226)    ED Course/ Medical Decision Making/ A&P   {CRITICAL CARE Performed by: Bethann Berkshire Total critical care time: 45 minutes Critical care time was exclusive of separately billable procedures and treating other patients. Critical care was necessary to treat or prevent imminent or life-threatening deterioration. Critical care was time spent personally by me on the following activities: development of treatment plan with patient and/or surrogate as well as nursing, discussions with consultants, evaluation of patient's response to treatment, examination of patient, obtaining history from patient or surrogate, ordering and performing treatments and interventions, ordering and review of  laboratory studies, ordering and review of radiographic studies, pulse oximetry and re-evaluation of patient's condition.      Patient has an acute stroke seen on CT scan.  We got a CTA that shows some internal carotid occlusion.  I spoke with neurology Dr.Khaliqdina and he wants patient admitted to [facility] under the hospitalist service and neurology will consult tonight Click here for ABCD2, HEART and other calculatorsREFRESH Note before signing :1}                              Medical Decision Making Amount and/or Complexity of Data Reviewed Labs: ordered. Radiology: ordered.  Risk Prescription drug management. Decision regarding hospitalization.   Acute stroke seen on CT.  Symptoms are over 24 hours old  {Document critical care time when appropriate:1} {Document review of labs and clinical decision tools ie heart score, Chads2Vasc2 etc:1}  {Document your independent  review of radiology images, and any outside records:1} {Document your discussion with family members, caretakers, and with consultants:1} {Document social determinants of health affecting pt's care:1} {Document your decision making why or why not admission, treatments were needed:1} Final Clinical Impression(s) / ED Diagnoses Final diagnoses:  Acute CVA (cerebrovascular accident) (HCC)    Rx / DC Orders ED Discharge Orders     None

## 2023-11-07 NOTE — H&P (Incomplete)
 [place]   [name]    MR#:  [id number]  DATE OF BIRTH:  04-29-1966  DATE OF ADMISSION:  11/07/2023  PRIMARY CARE PHYSICIAN: Pcp, No   Patient is coming from: Home  REQUESTING/REFERRING PHYSICIAN: Bethann Berkshire, MD  CHIEF COMPLAINT:   Chief Complaint  Patient presents with   Aphasia   Altered Mental Status    HISTORY OF PRESENT ILLNESS:  JONASIA COINER is a 58 y.o. Caucasian female with medical history significant for anxiety, arthritis, depression, fibromyalgia, GERD, hypertension, dyslipidemia and type 2 diabetes mellitus, who presented to the emergency room with acute onset of altered mental status, staring into space and expressive dysphasia with inability to communicate since yesterday.  This started around 1:40 PM on Saturday.  The patient denied any headache or dizziness or blurred vision.  She experienced right hand weakness.  No paresthesias or other focal muscle weakness..  No chest pain or palpitations.  No cough or wheezing or dyspnea.  No urinary frequency or urgency or flank pain.  ED Course: When the patient came to the ER, BP was 183/107 with otherwise normal vital signs. CMP revealed hyponatremia 131 and CO2 18 with a blood glucose of 155, BUN of 35 and creatinine 2.15 above previous levels.  Calcium was 8.8.  CBC showed no consultation.  PT and INR were within normal. EKG as reviewed by me : EKG showed normal sinus rhythm with a rate of 90 with low voltage QRS with poor R wave progression and Q waves inferiorly. Imaging: Noncontrast head CT scan revealed multiple foci of hypoattenuation of the left frontal and basal ganglia concerning for acute or subacute infarctions.  CTA of the head and neck revealed the following: 1. Critically stenotic and nearly occlusive left ICA origin with only a tiny stream of contrast visible. More distal ICA is small, but remains patent. 2. Severe bilateral paraclinoid ICA stenosis. 3. Severe right  vertebral artery origin stenosis. 4. Extensive atherosclerosis of the left common carotid artery with multifocal moderate to severe stenosis. 5. Probable moderate stenosis of the left subclavian artery, although motion limits assessment. 6.  Aortic Atherosclerosis (ICD10-I70.0).  The patient was given 1 L bolus of IV normal saline and 325 mg p.o. aspirin and 75 mg p.o. Plavix.  She will be admitted to an observation medical telemetry bed for further evaluation and management. PAST MEDICAL HISTORY:   Past Medical History:  Diagnosis Date   Acute CVA (cerebrovascular accident) (HCC) 11/07/2023   Anxiety 03/05/2016   Arthritis    rheumatoid   Asthma    Body aches 03/05/2016   Depression    Fibromyalgia    GERD (gastroesophageal reflux disease)    Hyperlipidemia    Hypertension    Obesity    Recent onset of diabetes mellitus (HCC) 03/10/2016   Referred to Dr Fransico Him and rx metformin 500 mg bid and zocor 20 mg 1 daily   Varicose vein of leg 03/05/2016   Vitiligo 03/05/2016    PAST SURGICAL HISTORY:   Past Surgical History:  Procedure Laterality Date   CHOLECYSTECTOMY     COLONOSCOPY N/A 08/20/2016   Procedure: COLONOSCOPY;  Surgeon: Malissa Hippo, MD;  Location: [facility];  Service: Endoscopy;  Laterality: N/A;  830    DILATION AND CURETTAGE OF UTERUS     GALLBLADDER SURGERY     LEG SURGERY Left    broke leg   POLYPECTOMY  08/20/2016   Procedure: POLYPECTOMY;  Surgeon: Ok Anis  Cline Crock, MD;  Location: [facility];  Service: Endoscopy;;  colon   TUBAL LIGATION      SOCIAL HISTORY:   Social History   Tobacco Use   Smoking status: Every Day    Current packs/day: 1.00    Average packs/day: 1 pack/day for 20.0 years (20.0 ttl pk-yrs)    Types: Cigarettes   Smokeless tobacco: Never  Substance Use Topics   Alcohol use: Yes    Comment: occ 1 monthly     FAMILY HISTORY:   Family History  Problem Relation Age of Onset   Cancer Paternal Grandfather        prostate    Diabetes Paternal Grandmother    Emphysema Maternal Grandmother    Cancer Maternal Grandmother        lung   Alcohol abuse Maternal Grandfather    Diabetes Father    Hypertension Mother    Diabetes Mother    Crohn's disease Sister     DRUG ALLERGIES:   Allergies  Allergen Reactions   Lipitor [Atorvastatin] Other (See Comments)    Myalgia and joint pain    REVIEW OF SYSTEMS:   ROS As per history of present illness. All pertinent systems were reviewed above. Constitutional, HEENT, cardiovascular, respiratory, GI, GU, musculoskeletal, neuro, psychiatric, endocrine, integumentary and hematologic systems were reviewed and are otherwise negative/unremarkable except for positive findings mentioned above in the HPI.   MEDICATIONS AT HOME:   Prior to Admission medications   Medication Sig Start Date End Date Taking? Authorizing Provider  albuterol (PROVENTIL HFA;VENTOLIN HFA) 108 (90 Base) MCG/ACT inhaler Inhale 2 puffs into the lungs every 6 (six) hours as needed for wheezing or shortness of breath. 05/28/16   Eustace Moore, MD  Cholecalciferol (VITAMIN D3) 5000 units CAPS Take 1 capsule (5,000 Units total) by mouth daily. 07/16/16   Roma Kayser, MD  diphenhydrAMINE (BENADRYL) 25 MG tablet Take 50 mg by mouth at bedtime as needed for sleep.     [provider]  DULoxetine (CYMBALTA) 30 MG capsule Take 1 capsule (30 mg total) by mouth daily. 06/03/17   Eustace Moore, MD  ibuprofen (ADVIL,MOTRIN) 200 MG tablet Take 400 mg by mouth as needed for headache or moderate pain.     [provider]  losartan (COZAAR) 25 MG tablet Take 1 tablet (25 mg total) by mouth daily. 12/03/16   Eustace Moore, MD  metFORMIN (GLUCOPHAGE) 500 MG tablet 500 mg daily 12/03/16   Eustace Moore, MD  Polyethyl Glycol-Propyl Glycol (SYSTANE OP) Apply 1 drop to eye as needed.     [provider]  predniSONE (DELTASONE) 5 MG tablet Take 1 tablet (5 mg total) by mouth  daily with breakfast. 07/15/17   Eustace Moore, MD      VITAL SIGNS:  Blood pressure (!) 169/96, pulse 86, resp. rate (!) 26, weight 108.9 kg, SpO2 97%.  PHYSICAL EXAMINATION:  Physical Exam  GENERAL:  58 y.o.-year-old patient lying in the bed with no acute distress.  EYES: Pupils equal, round, reactive to light and accommodation. No scleral icterus. Extraocular muscles intact.  HEENT: Head atraumatic, normocephalic. Oropharynx and nasopharynx clear.  NECK:  Supple, no jugular venous distention. No thyroid enlargement, no tenderness.  LUNGS: Normal breath sounds bilaterally, no wheezing, rales,rhonchi or crepitation. No use of accessory muscles of respiration.  CARDIOVASCULAR: Regular rate and rhythm, S1, S2 normal. No murmurs, rubs, or gallops.  ABDOMEN: Soft, nondistended, nontender. Bowel sounds present. No organomegaly or mass.  EXTREMITIES: No pedal edema, cyanosis, or clubbing.  NEUROLOGIC: Cranial nerves II through XII are intact. Muscle strength 5/5 in all extremities. Sensation intact. Gait not checked.  PSYCHIATRIC: The patient is alert and oriented x 3.  Significant improved expressive dysphasia.  Normal affect and good eye contact. SKIN: No obvious rash, lesion, or ulcer.   LABORATORY PANEL:   CBC Recent Labs  Lab 11/07/23 2053  WBC 9.1  HGB 15.9*  HCT 46.9*  PLT 179   ------------------------------------------------------------------------------------------------------------------  Chemistries  Recent Labs  Lab 11/07/23 2053  NA 131*  K 4.2  CL 102  CO2 18*  GLUCOSE 155*  BUN 35*  CREATININE 2.15*  CALCIUM 8.8*  AST 18  ALT 25  ALKPHOS 96  BILITOT 0.8   ------------------------------------------------------------------------------------------------------------------  Cardiac Enzymes No results for input(s): "TROPONINI" in the last 168  hours. ------------------------------------------------------------------------------------------------------------------  RADIOLOGY:  CT ANGIO HEAD NECK W WO CM Result Date: 11/07/2023 CLINICAL DATA:  Stroke/TIA, determine embolic source EXAM: CT ANGIOGRAPHY HEAD AND NECK WITH AND WITHOUT CONTRAST TECHNIQUE: Multidetector CT imaging of the head and neck was performed using the standard protocol during bolus administration of intravenous contrast. Multiplanar CT image reconstructions and MIPs were obtained to evaluate the vascular anatomy. Carotid stenosis measurements (when applicable) are obtained utilizing NASCET criteria, using the distal internal carotid diameter as the denominator. RADIATION DOSE REDUCTION: This exam was performed according to the departmental dose-optimization program which includes automated exposure control, adjustment of the mA and/or kV according to patient size and/or use of iterative reconstruction technique. CONTRAST:  60mL OMNIPAQUE IOHEXOL 350 MG/ML SOLN COMPARISON:  CT head from earlier today. FINDINGS: CTA NECK FINDINGS Aortic arch: Aortic atherosclerosis. Great vessel origins are patent. Motion limits evaluation for stenosis with probable moderate stenosis of the left proximal subclavian artery. Right carotid system: Atherosclerosis at the carotid bifurcation without greater than 50% stenosis. Left carotid system: Extensive atherosclerosis of the common carotid artery with multifocal moderate to severe stenosis. Critically stenotic and nearly occlusive left ICA origin with only a tiny stream of contrast visible. More distal ICA in the neck is small, but remains patent. Vertebral arteries: Right dominant. Severe stenosis of the right vertebral artery origin. Left vertebral artery is patent without definite high-grade stenosis although evaluation at the origin is limited due to motion. Skeleton: No acute abnormality on limited assessment. Other neck: No acute abnormality on  limited assessment. Upper chest: Visualized lung apices are clear. Review of the MIP images confirms the above findings CTA HEAD FINDINGS Anterior circulation: Small left ICA. Severe bilateral paraclinoid ICA stenosis. Bilateral MCAs are patent without proximal high-grade stenosis. 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 Right A1 ACA and bilateral A2 ACAs are patent proximally. Limited distal evaluation due to motion. Posterior circulation: Bilateral intradural vertebral arteries, basilar artery and bilateral posterior cerebral arteries are patent without proximal high-grade stenosis. Atherosclerotic irregularity of the PCAs bilaterally. Venous sinuses: As permitted by contrast timing, patent. Review of the MIP images confirms the above findings IMPRESSION: 1. Critically stenotic and nearly occlusive left ICA origin with only a tiny stream of contrast visible. More distal ICA is small, but remains patent. 2. Severe bilateral paraclinoid ICA stenosis. 3. Severe right vertebral artery origin stenosis. 4. Extensive atherosclerosis of the left common carotid artery with multifocal moderate to severe stenosis. 5. Probable moderate stenosis of the left subclavian artery, although motion limits assessment. 6.  Aortic Atherosclerosis (ICD10-I70.0). Findings discussed with Dr. Estell Harpin via telephone at 11:04 PM. Electronically Signed   By: Gelene Mink  Barnett Applebaum M.D.   On: 11/07/2023 23:06   CT Head Wo Contrast Result Date: 11/07/2023 CLINICAL DATA:  Altered mental status, aphasia, memory loss, slurred speech EXAM: CT HEAD WITHOUT CONTRAST TECHNIQUE: Contiguous axial images were obtained from the base of the skull through the vertex without intravenous contrast. RADIATION DOSE REDUCTION: This exam was performed according to the departmental dose-optimization program which includes automated exposure control, adjustment of the mA and/or kV according to patient size and/or use of iterative reconstruction  technique. COMPARISON:  None Available. FINDINGS: Brain: Hypoattenuation within the superior left frontal lobe near the vertex (circa series 2/image 25 and 4/31) concerning for acute or subacute infarcts. Additional foci of hypoattenuation in the left basal ganglia (2/17 and 2/15). Vascular: No hyperdense vessel or unexpected calcification. Skull: Normal. Negative for fracture or focal lesion. Sinuses/Orbits: Moderate opacification of the mastoid air cells bilaterally. The paranasal sinuses are well aerated. Other: None. IMPRESSION: Multiple foci of hypoattenuation and left frontal lobe and basal ganglia concerning for acute or subacute infarcts. Critical Value/emergent results were called by telephone at the time of interpretation on 11/07/2023 at 9:35 pm to provider [facility] ZAMMIT , who verbally acknowledged these results. Electronically Signed   By: Minerva Fester M.D.   On: 11/07/2023 21:38      IMPRESSION AND PLAN:  Assessment and Plan: * Acute CVA (cerebrovascular accident) [facility]) - The patient will be admitted to an observation medically monitored bed.   - We will follow neuro checks q.4 hours for 24 hours.   - The patient will be placed on aspirin and Plavix.   - Will obtain a brain MRI without contrast as well as 2D echo with bubble study. - A neurology consultation  as well as physical/occupation/speech therapy consults will be obtained in a.m. - I notified Dr. Derry Lory about the patient.   - The patient will be placed on statin therapy and fasting lipids will be checked.   Acute kidney injury superimposed on chronic kidney disease (HCC) - This associated with mild metabolic acidosis, and is likely prerenal due to volume depletion and dehydration with associated hyponatremia. - The patient be hydrated with IV normal saline and will follow BMP. - We will avoid nephrotoxins.  Controlled type 2 diabetes mellitus without complication, without long-term current use of insulin (HCC) - The  patient will be placed on supplemental coverage with NovoLog. - We will hold off metformin.  Essential hypertension - We will allow permissive hypertension. - We will hold off Cozaar.   DVT prophylaxis: Lovenox.  Advanced Care Planning:  Code Status: full code.  Family Communication:  The plan of care was discussed in details with the patient (and family). I answered all questions. The patient agreed to proceed with the above mentioned plan. Further management will depend upon hospital course. Disposition Plan: Back to previous home environment Consults called: Neurology. All the records are reviewed and case discussed with ED provider.  Status is: Observation  I certify that at the time of admission, it is my clinical judgment that the patient will require  hospital care extending less than 2 midnights.                            Dispo: The patient is from: Home              Anticipated d/c is to: Home              Patient currently is not  medically stable to d/c.              Difficult to place patient: No  Hannah Beat M.D on 11/08/2023 at 1:57 AM  [facility]   From 7 PM-7 AM, contact night-coverage [email or web address]  CC: Primary care physician; Pcp, No

## 2023-11-08 ENCOUNTER — Observation Stay (HOSPITAL_COMMUNITY)

## 2023-11-08 ENCOUNTER — Other Ambulatory Visit: Payer: Self-pay

## 2023-11-08 ENCOUNTER — Observation Stay (HOSPITAL_BASED_OUTPATIENT_CLINIC_OR_DEPARTMENT_OTHER)

## 2023-11-08 DIAGNOSIS — E119 Type 2 diabetes mellitus without complications: Secondary | ICD-10-CM

## 2023-11-08 DIAGNOSIS — E871 Hypo-osmolality and hyponatremia: Secondary | ICD-10-CM | POA: Diagnosis not present

## 2023-11-08 DIAGNOSIS — I10 Essential (primary) hypertension: Secondary | ICD-10-CM | POA: Diagnosis not present

## 2023-11-08 DIAGNOSIS — F17210 Nicotine dependence, cigarettes, uncomplicated: Secondary | ICD-10-CM | POA: Diagnosis not present

## 2023-11-08 DIAGNOSIS — E860 Dehydration: Secondary | ICD-10-CM | POA: Diagnosis not present

## 2023-11-08 DIAGNOSIS — E1122 Type 2 diabetes mellitus with diabetic chronic kidney disease: Secondary | ICD-10-CM | POA: Diagnosis not present

## 2023-11-08 DIAGNOSIS — I6522 Occlusion and stenosis of left carotid artery: Secondary | ICD-10-CM | POA: Diagnosis not present

## 2023-11-08 DIAGNOSIS — R471 Dysarthria and anarthria: Secondary | ICD-10-CM | POA: Diagnosis not present

## 2023-11-08 DIAGNOSIS — I63542 Cerebral infarction due to unspecified occlusion or stenosis of left cerebellar artery: Secondary | ICD-10-CM | POA: Diagnosis not present

## 2023-11-08 DIAGNOSIS — I6523 Occlusion and stenosis of bilateral carotid arteries: Secondary | ICD-10-CM

## 2023-11-08 DIAGNOSIS — J45909 Unspecified asthma, uncomplicated: Secondary | ICD-10-CM | POA: Diagnosis not present

## 2023-11-08 DIAGNOSIS — R29818 Other symptoms and signs involving the nervous system: Secondary | ICD-10-CM | POA: Diagnosis not present

## 2023-11-08 DIAGNOSIS — I639 Cerebral infarction, unspecified: Secondary | ICD-10-CM | POA: Diagnosis not present

## 2023-11-08 DIAGNOSIS — I63232 Cerebral infarction due to unspecified occlusion or stenosis of left carotid arteries: Secondary | ICD-10-CM

## 2023-11-08 DIAGNOSIS — R29701 NIHSS score 1: Secondary | ICD-10-CM

## 2023-11-08 DIAGNOSIS — I472 Ventricular tachycardia, unspecified: Secondary | ICD-10-CM | POA: Diagnosis not present

## 2023-11-08 DIAGNOSIS — E872 Acidosis, unspecified: Secondary | ICD-10-CM | POA: Diagnosis not present

## 2023-11-08 DIAGNOSIS — N179 Acute kidney failure, unspecified: Secondary | ICD-10-CM | POA: Diagnosis not present

## 2023-11-08 DIAGNOSIS — N1831 Chronic kidney disease, stage 3a: Secondary | ICD-10-CM | POA: Diagnosis not present

## 2023-11-08 DIAGNOSIS — E1165 Type 2 diabetes mellitus with hyperglycemia: Secondary | ICD-10-CM | POA: Diagnosis not present

## 2023-11-08 DIAGNOSIS — N189 Chronic kidney disease, unspecified: Secondary | ICD-10-CM

## 2023-11-08 DIAGNOSIS — Z6841 Body Mass Index (BMI) 40.0 and over, adult: Secondary | ICD-10-CM | POA: Diagnosis not present

## 2023-11-08 DIAGNOSIS — R4701 Aphasia: Secondary | ICD-10-CM | POA: Diagnosis not present

## 2023-11-08 DIAGNOSIS — G936 Cerebral edema: Secondary | ICD-10-CM | POA: Diagnosis not present

## 2023-11-08 DIAGNOSIS — I2510 Atherosclerotic heart disease of native coronary artery without angina pectoris: Secondary | ICD-10-CM | POA: Diagnosis not present

## 2023-11-08 DIAGNOSIS — I129 Hypertensive chronic kidney disease with stage 1 through stage 4 chronic kidney disease, or unspecified chronic kidney disease: Secondary | ICD-10-CM | POA: Diagnosis not present

## 2023-11-08 LAB — CBC WITH DIFFERENTIAL/PLATELET
Abs Immature Granulocytes: 0.04 10*3/uL (ref 0.00–0.07)
Basophils Absolute: 0 10*3/uL (ref 0.0–0.1)
Basophils Relative: 0 %
Eosinophils Absolute: 0 10*3/uL (ref 0.0–0.5)
Eosinophils Relative: 0 %
HCT: 44.6 % (ref 36.0–46.0)
Hemoglobin: 15.1 g/dL — ABNORMAL HIGH (ref 12.0–15.0)
Immature Granulocytes: 1 %
Lymphocytes Relative: 13 %
Lymphs Abs: 0.9 10*3/uL (ref 0.7–4.0)
MCH: 31 pg (ref 26.0–34.0)
MCHC: 33.9 g/dL (ref 30.0–36.0)
MCV: 91.6 fL (ref 80.0–100.0)
Monocytes Absolute: 0.5 10*3/uL (ref 0.1–1.0)
Monocytes Relative: 7 %
Neutro Abs: 5.9 10*3/uL (ref 1.7–7.7)
Neutrophils Relative %: 79 %
Platelets: 158 10*3/uL (ref 150–400)
RBC: 4.87 MIL/uL (ref 3.87–5.11)
RDW: 13.5 % (ref 11.5–15.5)
WBC: 7.4 10*3/uL (ref 4.0–10.5)
nRBC: 0 % (ref 0.0–0.2)

## 2023-11-08 LAB — BASIC METABOLIC PANEL WITH GFR
Anion gap: 8 (ref 5–15)
BUN: 29 mg/dL — ABNORMAL HIGH (ref 6–20)
CO2: 21 mmol/L — ABNORMAL LOW (ref 22–32)
Calcium: 8.7 mg/dL — ABNORMAL LOW (ref 8.9–10.3)
Chloride: 107 mmol/L (ref 98–111)
Creatinine, Ser: 1.97 mg/dL — ABNORMAL HIGH (ref 0.44–1.00)
GFR, Estimated: 29 mL/min — ABNORMAL LOW (ref >60)
Glucose, Bld: 192 mg/dL — ABNORMAL HIGH (ref 70–99)
Potassium: 5 mmol/L (ref 3.5–5.1)
Sodium: 136 mmol/L (ref 135–145)

## 2023-11-08 LAB — ECHOCARDIOGRAM COMPLETE BUBBLE STUDY
AR max vel: 1.95 cm2
AV Area VTI: 2.38 cm2
AV Area mean vel: 2.15 cm2
AV Mean grad: 3 mmHg
AV Peak grad: 7 mmHg
Ao pk vel: 1.32 m/s
Area-P 1/2: 4.36 cm2
MV VTI: 1.59 cm2
S' Lateral: 3.4 cm

## 2023-11-08 LAB — RAPID URINE DRUG SCREEN, HOSP PERFORMED
Amphetamines: NOT DETECTED
Barbiturates: NOT DETECTED
Benzodiazepines: NOT DETECTED
Cocaine: NOT DETECTED
Opiates: NOT DETECTED
Tetrahydrocannabinol: NOT DETECTED

## 2023-11-08 LAB — URIC ACID: Uric Acid, Serum: 9.2 mg/dL — ABNORMAL HIGH (ref 2.5–7.1)

## 2023-11-08 LAB — TYPE AND SCREEN
ABO/RH(D): A NEG
Antibody Screen: NEGATIVE

## 2023-11-08 LAB — LIPID PANEL
Cholesterol: 155 mg/dL (ref 0–200)
HDL: 26 mg/dL — ABNORMAL LOW (ref >40)
LDL Cholesterol: 100 mg/dL — ABNORMAL HIGH (ref 0–99)
Total CHOL/HDL Ratio: 6 ratio
Triglycerides: 145 mg/dL (ref <150)
VLDL: 29 mg/dL (ref 0–40)

## 2023-11-08 LAB — OSMOLALITY: Osmolality: 297 mosm/kg — ABNORMAL HIGH (ref 275–295)

## 2023-11-08 LAB — GLUCOSE, CAPILLARY
Glucose-Capillary: 181 mg/dL — ABNORMAL HIGH (ref 70–99)
Glucose-Capillary: 152 mg/dL — ABNORMAL HIGH (ref 70–99)
Glucose-Capillary: 141 mg/dL — ABNORMAL HIGH (ref 70–99)
Glucose-Capillary: 190 mg/dL — ABNORMAL HIGH (ref 70–99)

## 2023-11-08 LAB — MAGNESIUM: Magnesium: 1.9 mg/dL (ref 1.7–2.4)

## 2023-11-08 LAB — HEMOGLOBIN A1C
Hgb A1c MFr Bld: 9 % — ABNORMAL HIGH (ref 4.8–5.6)
Hgb A1c MFr Bld: 9.1 % — ABNORMAL HIGH (ref 4.8–5.6)
Mean Plasma Glucose: 211.6 mg/dL
Mean Plasma Glucose: 214.47 mg/dL

## 2023-11-08 LAB — ABO/RH: ABO/RH(D): A NEG

## 2023-11-08 LAB — CBG MONITORING, ED: Glucose-Capillary: 165 mg/dL — ABNORMAL HIGH (ref 70–99)

## 2023-11-08 LAB — OSMOLALITY, URINE: Osmolality, Ur: 371 mosm/kg (ref 300–900)

## 2023-11-08 LAB — SODIUM, URINE, RANDOM: Sodium, Ur: 98 mmol/L

## 2023-11-08 LAB — CREATININE, URINE, RANDOM: Creatinine, Urine: 46 mg/dL

## 2023-11-08 LAB — HIV ANTIBODY (ROUTINE TESTING W REFLEX): HIV Screen 4th Generation wRfx: NONREACTIVE

## 2023-11-08 MED ORDER — INSULIN ASPART 100 UNIT/ML IJ SOLN
0.0000 [IU] | Freq: Three times a day (TID) | INTRAMUSCULAR | Status: DC
Start: 2023-11-08 — End: 2023-11-08

## 2023-11-08 MED ORDER — INSULIN ASPART 100 UNIT/ML IJ SOLN: 0.0000 [IU] | Freq: Every day | INTRAMUSCULAR | Status: DC

## 2023-11-08 MED ORDER — INSULIN ASPART 100 UNIT/ML IJ SOLN
0.0000 [IU] | Freq: Three times a day (TID) | INTRAMUSCULAR | Status: DC
  Administered 2023-11-08: 2 [IU] via SUBCUTANEOUS
  Administered 2023-11-08 – 2023-11-09 (×3): 1 [IU] via SUBCUTANEOUS

## 2023-11-08 MED ORDER — ALBUTEROL SULFATE (2.5 MG/3ML) 0.083% IN NEBU: 2.5000 mg | INHALATION_SOLUTION | Freq: Four times a day (QID) | RESPIRATORY_TRACT | Status: DC | PRN

## 2023-11-08 MED ORDER — CLOPIDOGREL BISULFATE 75 MG PO TABS
75.0000 mg | ORAL_TABLET | Freq: Every day | ORAL | Status: DC
  Administered 2023-11-08 – 2023-11-09 (×2): 75 mg via ORAL
  Filled 2023-11-08 (×2): qty 1

## 2023-11-08 MED ORDER — POLYVINYL ALCOHOL 1.4 % OP SOLN
1.0000 [drp] | OPHTHALMIC | Status: DC | PRN
Start: 2023-11-08 — End: 2023-11-10

## 2023-11-08 MED ORDER — ROSUVASTATIN CALCIUM 20 MG PO TABS
20.0000 mg | ORAL_TABLET | Freq: Every day | ORAL | Status: DC
  Administered 2023-11-08 – 2023-11-10 (×3): 20 mg via ORAL
  Filled 2023-11-08 (×3): qty 1

## 2023-11-08 MED ORDER — SODIUM CHLORIDE 0.9 % IV SOLN
INTRAVENOUS | Status: AC
Start: 2023-11-08 — End: 2023-11-09

## 2023-11-08 MED ORDER — NICOTINE 21 MG/24HR TD PT24: 21.0000 mg | MEDICATED_PATCH | Freq: Every day | TRANSDERMAL | Status: DC

## 2023-11-08 MED ORDER — ASPIRIN 81 MG PO TBEC
81.0000 mg | DELAYED_RELEASE_TABLET | Freq: Every day | ORAL | Status: DC
  Administered 2023-11-08 – 2023-11-10 (×3): 81 mg via ORAL
  Filled 2023-11-08 (×3): qty 1

## 2023-11-08 NOTE — Plan of Care (Signed)

## 2023-11-08 NOTE — Evaluation (Signed)
 Occupational Therapy Evaluation Patient Details Name: Erica Graham MRN: [id number] DOB: 10/30/1965 Today's Date: 11/08/2023   History of Present Illness   Patient is a 58 yo female presenting on 11/07/23 to the ED with slurred speech and AMS. All symptoms resolving prior to being assessed by MD. CTA finding acute/subacute left frontal and basal ganglia infarcts. Also noted critically stenosed left ICA at the origin with positive string sign. MRI pending. PMH includes: CVA, anxiety, arthritis, depression, fibromyalgia, GERD, hypertension, dyslipidemia and type 2 diabetes mellitus     Clinical Impressions Prior to this admission, patient living alone, with son checking on her intermittently. Patient fully independent, driving, and managing all her ADLs and IADLs. Currently, patient is at set up for mobility and ADL management with only deficit noted to be in expressive language and overall response time to questions posed. OT will continue to follow acutely to assess upper level cognition, but do not anticipate OT follow up required at discharge. OT messaging SLP at end of session to complete evaluation in regard to deficits noted in session.      If plan is discharge home, recommend the following:   Supervision due to cognitive status;Direct supervision/assist for financial management;Direct supervision/assist for medications management;Assistance with cooking/housework (initially)     Functional Status Assessment   Patient has had a recent decline in their functional status and demonstrates the ability to make significant improvements in function in a reasonable and predictable amount of time.     Equipment Recommendations   None recommended by OT     Recommendations for Other Services         Precautions/Restrictions   Precautions Recall of Precautions/Restrictions: Intact Restrictions Weight Bearing Restrictions Per Provider Order: No     Mobility Bed  Mobility Overal bed mobility: Needs Assistance Bed Mobility: Supine to Sit     Supine to sit: Supervision     General bed mobility comments: safety    Transfers Overall transfer level: Needs assistance   Transfers: Sit to/from Stand Sit to Stand: Supervision           General transfer comment: supervision for safety      Balance Overall balance assessment: Needs assistance Sitting-balance support: Feet supported, Single extremity supported Sitting balance-Leahy Scale: Good     Standing balance support: No upper extremity supported, During functional activity Standing balance-Leahy Scale: Good                             ADL either performed or assessed with clinical judgement   ADL Overall ADL's : Needs assistance/impaired Eating/Feeding: Set up;Sitting   Grooming: Set up;Sitting   Upper Body Bathing: Set up;Sitting   Lower Body Bathing: Set up;Sitting/lateral leans;Sit to/from stand   Upper Body Dressing : Set up;Sitting   Lower Body Dressing: Set up;Sit to/from stand;Sitting/lateral leans   Toilet Transfer: Set up;Ambulation;Regular Toilet   Toileting- Clothing Manipulation and Hygiene: Set up;Sitting/lateral lean;Sit to/from stand       Functional mobility during ADLs: Set up General ADL Comments: Prior to this admission, patient living alone, with son checking on her intermittently. Patient fully independent, driving, and managing all her ADLs and IADLs. Currently, patient is at set up for mobility and ADL management with only deficit noted to be in expressive language and overall response time to questions posed. OT will continue to follow acutely to assess upper level cognition, but do not anticipate OT follow up required at discharge. OT  messaging SLP at end of session to complete evaluation in regard to deficits noted in session.     Vision   Additional Comments: need to assess next session     Perception Perception: Within Functional  Limits       Praxis Praxis: [facility]       Pertinent Vitals/Pain Pain Assessment Pain Assessment: No/denies pain     Extremity/Trunk Assessment Upper Extremity Assessment Upper Extremity Assessment: Right hand dominant;Overall [facility] for tasks assessed   Lower Extremity Assessment Lower Extremity Assessment: Defer to PT evaluation   Cervical / Trunk Assessment Cervical / Trunk Assessment: Other exceptions (increased body habitus)   Communication Communication Communication: Impaired Factors Affecting Communication: Difficulty expressing self   Cognition Arousal: Alert Behavior During Therapy: WFL for tasks assessed/performed Cognition: Cognition impaired     Awareness: Intellectual awareness intact Memory impairment (select all impairments): Short-term memory Attention impairment (select first level of impairment): Selective attention   OT - Cognition Comments: Appears to be more speech related with expressive difficulties and word finding                 Following commands: Intact       Cueing  General Comments   Cueing Techniques: Verbal cues      Exercises     Shoulder Instructions      Home Living Family/patient expects to be discharged to:: Private residence Living Arrangements: Alone Available Help at Discharge: Family;Available PRN/intermittently Type of Home: House Home Access: Level entry     Home Layout: One level     Bathroom Shower/Tub: Chief Strategy Officer: Handicapped height     Home Equipment: None          Prior Functioning/Environment Prior Level of Function : Driving;Independent/Modified Independent             Mobility Comments: independent ADLs Comments: independent with ADLs and IADLs    OT Problem List: Decreased cognition   OT Treatment/Interventions: Cognitive remediation/compensation;Therapeutic exercise;Self-care/ADL training      OT Goals(Current goals can be found in the care plan  section)   Acute Rehab OT Goals Patient Stated Goal: to go home OT Goal Formulation: With patient/family Time For Goal Achievement: 11/22/23 Potential to Achieve Goals: Good ADL Goals Pt Will Perform Lower Body Bathing: Independently;sitting/lateral leans;sit to/from stand Pt Will Perform Lower Body Dressing: Independently;sit to/from stand;sitting/lateral leans Pt Will Transfer to Toilet: Independently;ambulating;regular height toilet Pt Will Perform Toileting - Clothing Manipulation and hygiene: Independently;sitting/lateral leans;sit to/from stand Additional ADL Goal #1: Patient will be able to complete upper level cognitive task without need for cues or correction in order to return to prior level independently.   OT Frequency:  Min 1X/week    Co-evaluation              AM-PAC OT "6 Clicks" Daily Activity     Outcome Measure Help from another person eating meals?: A Little Help from another person taking care of personal grooming?: A Little Help from another person toileting, which includes using toliet, bedpan, or urinal?: A Little Help from another person bathing (including washing, rinsing, drying)?: A Little Help from another person to put on and taking off regular upper body clothing?: A Little Help from another person to put on and taking off regular lower body clothing?: A Little 6 Click Score: 18   End of Session Equipment Utilized During Treatment: Gait belt Nurse Communication: Mobility status  Activity Tolerance: Patient tolerated treatment well Patient left: in bed;with call  bell/phone within reach;with nursing/sitter in room;with family/visitor present (sitting EOB)  OT Visit Diagnosis: Other symptoms and signs involving cognitive function                Time: 1610-9604 OT Time Calculation (min): 16 min Charges:  OT General Charges $OT Visit: 1 Visit OT Evaluation $OT Eval Moderate Complexity: 1 Mod  Pollyann Glen E. Daune Colgate, OTR/L Acute Rehabilitation  Services [phone]   Erica Graham 11/08/2023, 10:34 AM

## 2023-11-08 NOTE — Progress Notes (Signed)
 Carotid duplex has been completed.   Results can be found under chart review under CV PROC. 11/08/2023 5:41 PM Joesiah Lonon RVT, RDMS

## 2023-11-08 NOTE — ED Notes (Signed)
 ED TO INPATIENT HANDOFF REPORT  ED Nurse Name and Phone #: Ephriam Knuckles Medic   S Name/Age/Gender Demetra Shiner 58 y.o. female Room/Bed: APA11/APA11  Code Status   Code Status: Full Code  Home/SNF/Other Home Patient oriented to: self, place, time, and situation Is this baseline? Yes   Triage Complete: Triage complete  Chief Complaint Acute CVA (cerebrovascular accident) [facility]) [I63.9]  Triage Note Pt son states that yesterday pt started acting abnormal and had some slurred speech. States she was very dissociative. Pt is A&O x4 and no new weakness. Pt able to form normal sentences. Denies headache. No facial droop.  Pt hypertensive 183/107   Allergies Allergies  Allergen Reactions   Lipitor [Atorvastatin] Other (See Comments)    Myalgia and joint pain    Level of Care/Admitting Diagnosis ED Disposition     ED Disposition  Admit   Condition  --   Comment  Hospital Area: [facility] [100100]  Level of Care: Telemetry Medical [104]  May place patient in observation at [facility] or [facility] if equivalent level of care is available:: No  Covid Evaluation: Asymptomatic - no recent exposure (last 10 days) testing not required  Diagnosis: Acute CVA (cerebrovascular accident) [facility]) [[id number]]  Admitting Physician: Hannah Beat [[id number]]  Attending Physician: Hannah Beat [[id number]]          B Medical/Surgery History Past Medical History:  Diagnosis Date   Acute CVA (cerebrovascular accident) (HCC) 11/07/2023   Anxiety 03/05/2016   Arthritis    rheumatoid   Asthma    Body aches 03/05/2016   Depression    Fibromyalgia    GERD (gastroesophageal reflux disease)    Hyperlipidemia    Hypertension    Obesity    Recent onset of diabetes mellitus (HCC) 03/10/2016   Referred to Dr Fransico Him and rx metformin 500 mg bid and zocor 20 mg 1 daily   Varicose vein of leg 03/05/2016   Vitiligo 03/05/2016   Past Surgical History:  Procedure Laterality Date    CHOLECYSTECTOMY     COLONOSCOPY N/A 08/20/2016   Procedure: COLONOSCOPY;  Surgeon: Malissa Hippo, MD;  Location: [facility];  Service: Endoscopy;  Laterality: N/A;  830    DILATION AND CURETTAGE OF UTERUS     GALLBLADDER SURGERY     LEG SURGERY Left    broke leg   POLYPECTOMY  08/20/2016   Procedure: POLYPECTOMY;  Surgeon: Malissa Hippo, MD;  Location: [facility];  Service: Endoscopy;;  colon   TUBAL LIGATION       A IV Location/Drains/Wounds Patient Lines/Drains/Airways Status     Active Line/Drains/Airways     Name Placement date Placement time Site Days   Peripheral IV 11/07/23 18 G Anterior;Right Forearm 11/07/23  2100  Forearm  1            Intake/Output Last 24 hours  Intake/Output Summary (Last 24 hours) at 11/08/2023 0458 Last data filed at 11/08/2023 0025 Gross per 24 hour  Intake 1000 ml  Output --  Net 1000 ml    Labs/Imaging Results for orders placed or performed during the hospital encounter of 11/07/23 (from the past 48 hours)  CBC with Differential     Status: Abnormal   Collection Time: 11/07/23  8:53 PM  Result Value Ref Range   WBC 9.1 4.0 - 10.5 K/uL   RBC 5.11 3.87 - 5.11 MIL/uL   Hemoglobin 15.9 (H) 12.0 - 15.0 g/dL   HCT 95.6 (H)  36.0 - 46.0 %   MCV 91.8 80.0 - 100.0 fL   MCH 31.1 26.0 - 34.0 pg   MCHC 33.9 30.0 - 36.0 g/dL   RDW 16.1 09.6 - 04.5 %   Platelets 179 150 - 400 K/uL   nRBC 0.0 0.0 - 0.2 %   Neutrophils Relative % 74 %   Neutro Abs 6.7 1.7 - 7.7 K/uL   Lymphocytes Relative 18 %   Lymphs Abs 1.6 0.7 - 4.0 K/uL   Monocytes Relative 7 %   Monocytes Absolute 0.6 0.1 - 1.0 K/uL   Eosinophils Relative 0 %   Eosinophils Absolute 0.0 0.0 - 0.5 K/uL   Basophils Relative 1 %   Basophils Absolute 0.1 0.0 - 0.1 K/uL   Immature Granulocytes 0 %   Abs Immature Granulocytes 0.03 0.00 - 0.07 K/uL    Comment: Performed at [facility], [address] [place], [place] [address]  Comprehensive metabolic panel     Status: Abnormal    Collection Time: 11/07/23  8:53 PM  Result Value Ref Range   Sodium 131 (L) 135 - 145 mmol/L   Potassium 4.2 3.5 - 5.1 mmol/L   Chloride 102 98 - 111 mmol/L   CO2 18 (L) 22 - 32 mmol/L   Glucose, Bld 155 (H) 70 - 99 mg/dL    Comment: Glucose reference range applies only to samples taken after fasting for at least 8 hours.   BUN 35 (H) 6 - 20 mg/dL   Creatinine, Ser 1.91 (H) 0.44 - 1.00 mg/dL   Calcium 8.8 (L) 8.9 - 10.3 mg/dL   Total Protein 7.4 6.5 - 8.1 g/dL   Albumin 3.5 3.5 - 5.0 g/dL   AST 18 15 - 41 U/L   ALT 25 0 - 44 U/L   Alkaline Phosphatase 96 38 - 126 U/L   Total Bilirubin 0.8 0.0 - 1.2 mg/dL   GFR, Estimated 26 (L) >60 mL/min    Comment: (NOTE) Calculated using the CKD-EPI Creatinine Equation (2021)    Anion gap 11 5 - 15    Comment: Performed at [facility], [address] [place], [place] [address]  Protime-INR     Status: None   Collection Time: 11/07/23  8:53 PM  Result Value Ref Range   Prothrombin Time 13.7 11.4 - 15.2 seconds   INR 1.0 0.8 - 1.2    Comment: (NOTE) INR goal varies based on device and disease states. Performed at [facility], [address], [place], [place] [address]   CBG monitoring, ED     Status: Abnormal   Collection Time: 11/08/23  2:13 AM  Result Value Ref Range   Glucose-Capillary 165 (H) 70 - 99 mg/dL    Comment: Glucose reference range applies only to samples taken after fasting for at least 8 hours.   CT ANGIO HEAD NECK W WO CM Result Date: 11/07/2023 CLINICAL DATA:  Stroke/TIA, determine embolic source EXAM: CT ANGIOGRAPHY HEAD AND NECK WITH AND WITHOUT CONTRAST TECHNIQUE: Multidetector CT imaging of the head and neck was performed using the standard protocol during bolus administration of intravenous contrast. Multiplanar CT image reconstructions and MIPs were obtained to evaluate the vascular anatomy. Carotid stenosis measurements (when applicable) are obtained utilizing NASCET criteria, using the distal internal carotid  diameter as the denominator. RADIATION DOSE REDUCTION: This exam was performed according to the departmental dose-optimization program which includes automated exposure control, adjustment of the mA and/or kV according to patient size and/or use of iterative reconstruction technique.  CONTRAST:  60mL OMNIPAQUE IOHEXOL 350 MG/ML SOLN COMPARISON:  CT head from earlier today. FINDINGS: CTA NECK FINDINGS Aortic arch: Aortic atherosclerosis. Great vessel origins are patent. Motion limits evaluation for stenosis with probable moderate stenosis of the left proximal subclavian artery. Right carotid system: Atherosclerosis at the carotid bifurcation without greater than 50% stenosis. Left carotid system: Extensive atherosclerosis of the common carotid artery with multifocal moderate to severe stenosis. Critically stenotic and nearly occlusive left ICA origin with only a tiny stream of contrast visible. More distal ICA in the neck is small, but remains patent. Vertebral arteries: Right dominant. Severe stenosis of the right vertebral artery origin. Left vertebral artery is patent without definite high-grade stenosis although evaluation at the origin is limited due to motion. Skeleton: No acute abnormality on limited assessment. Other neck: No acute abnormality on limited assessment. Upper chest: Visualized lung apices are clear. Review of the MIP images confirms the above findings CTA HEAD FINDINGS Anterior circulation: Small left ICA. Severe bilateral paraclinoid ICA stenosis. Bilateral MCAs are patent without proximal high-grade stenosis. 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 Right A1 ACA and bilateral A2 ACAs are patent proximally. Limited distal evaluation due to motion. Posterior circulation: Bilateral intradural vertebral arteries, basilar artery and bilateral posterior cerebral arteries are patent without proximal high-grade stenosis. Atherosclerotic irregularity of the PCAs bilaterally. Venous  sinuses: As permitted by contrast timing, patent. Review of the MIP images confirms the above findings IMPRESSION: 1. Critically stenotic and nearly occlusive left ICA origin with only a tiny stream of contrast visible. More distal ICA is small, but remains patent. 2. Severe bilateral paraclinoid ICA stenosis. 3. Severe right vertebral artery origin stenosis. 4. Extensive atherosclerosis of the left common carotid artery with multifocal moderate to severe stenosis. 5. Probable moderate stenosis of the left subclavian artery, although motion limits assessment. 6.  Aortic Atherosclerosis (ICD10-I70.0). Findings discussed with Dr. Estell Harpin via telephone at 11:04 PM. Electronically Signed   By: Feliberto Harts M.D.   On: 11/07/2023 23:06   CT Head Wo Contrast Result Date: 11/07/2023 CLINICAL DATA:  Altered mental status, aphasia, memory loss, slurred speech EXAM: CT HEAD WITHOUT CONTRAST TECHNIQUE: Contiguous axial images were obtained from the base of the skull through the vertex without intravenous contrast. RADIATION DOSE REDUCTION: This exam was performed according to the departmental dose-optimization program which includes automated exposure control, adjustment of the mA and/or kV according to patient size and/or use of iterative reconstruction technique. COMPARISON:  None Available. FINDINGS: Brain: Hypoattenuation within the superior left frontal lobe near the vertex (circa series 2/image 25 and 4/31) concerning for acute or subacute infarcts. Additional foci of hypoattenuation in the left basal ganglia (2/17 and 2/15). Vascular: No hyperdense vessel or unexpected calcification. Skull: Normal. Negative for fracture or focal lesion. Sinuses/Orbits: Moderate opacification of the mastoid air cells bilaterally. The paranasal sinuses are well aerated. Other: None. IMPRESSION: Multiple foci of hypoattenuation and left frontal lobe and basal ganglia concerning for acute or subacute infarcts. Critical Value/emergent  results were called by telephone at the time of interpretation on 11/07/2023 at 9:35 pm to provider [facility] ZAMMIT , who verbally acknowledged these results. Electronically Signed   By: Minerva Fester M.D.   On: 11/07/2023 21:38    Pending Labs Unresulted Labs (From admission, onward)     Start     Ordered   11/08/23 0500  Lipid panel  (Labs)  Tomorrow morning,   R       Comments: Fasting    11/07/23  2359   11/07/23 2356  HIV Antibody (routine testing w rflx)  (HIV Antibody (Routine testing w reflex) panel)  Once,   R        11/07/23 2359   11/07/23 2356  Hemoglobin A1c  (Labs)  Once,   R       Comments: To assess prior glycemic control    11/07/23 2359            Vitals/Pain Today's Vitals   11/08/23 0230 11/08/23 0330 11/08/23 0345 11/08/23 0415  BP: (!) 147/84 118/71 116/74 (!) 149/86  Pulse: 77 66 69 80  Resp: 20 20 18 13   Temp:    98.6 F (37 C)  TempSrc:    Oral  SpO2: 94% 94% 97% 97%  Weight:      PainSc:        Isolation Precautions No active isolations  Medications Medications  aspirin tablet 325 mg (325 mg Oral Given 11/07/23 2316)  DULoxetine (CYMBALTA) DR capsule 30 mg (has no administration in time range)  Vitamin D3 CAPS 5,000 Units (has no administration in time range)   stroke: early stages of recovery book (has no administration in time range)  0.9 %  sodium chloride infusion ( Intravenous New Bag/Given 11/08/23 0035)  acetaminophen (TYLENOL) tablet 650 mg (has no administration in time range)    Or  acetaminophen (TYLENOL) 160 MG/5ML solution 650 mg (has no administration in time range)    Or  acetaminophen (TYLENOL) suppository 650 mg (has no administration in time range)  senna-docusate (Senokot-S) tablet 1 tablet (has no administration in time range)  enoxaparin (LOVENOX) injection 40 mg (has no administration in time range)  traZODone (DESYREL) tablet 25 mg (has no administration in time range)  ondansetron (ZOFRAN) injection 4 mg (has no  administration in time range)  albuterol (PROVENTIL) (2.5 MG/3ML) 0.083% nebulizer solution 2.5 mg (has no administration in time range)  polyvinyl alcohol (LIQUIFILM TEARS) 1.4 % ophthalmic solution 1 drop (has no administration in time range)  insulin aspart (novoLOG) injection 0-9 Units (has no administration in time range)  insulin aspart (novoLOG) injection 0-5 Units ( Subcutaneous Not Given 11/08/23 0214)  clopidogrel (PLAVIX) tablet 300 mg (300 mg Oral Given 11/07/23 2211)  sodium chloride 0.9 % bolus 1,000 mL (0 mLs Intravenous Stopped 11/08/23 0025)  iohexol (OMNIPAQUE) 350 MG/ML injection 60 mL (60 mLs Intravenous Contrast Given 11/07/23 2226)    Mobility walks     Focused Assessments Neuro Assessment Handoff:  Swallow screen pass? Yes  Cardiac Rhythm: Normal sinus rhythm NIH Stroke Scale  Dizziness Present: No Headache Present: No Interval: Other (Comment) Level of Consciousness (1a.)   : Alert, keenly responsive LOC Questions (1b. )   : Answers both questions correctly LOC Commands (1c. )   : Performs both tasks correctly Best Gaze (2. )  : Normal Visual (3. )  : No visual loss Facial Palsy (4. )    : Normal symmetrical movements Motor Arm, Left (5a. )   : No drift Motor Arm, Right (5b. ) : No drift Motor Leg, Left (6a. )  : No drift Motor Leg, Right (6b. ) : No drift Limb Ataxia (7. ): Absent Sensory (8. )  : Normal, no sensory loss Best Language (9. )  : No aphasia Dysarthria (10. ): Normal Extinction/Inattention (11.)   : No Abnormality Complete NIHSS TOTAL: 0     Neuro Assessment: Within Defined Limits Neuro Checks:   Initial (11/07/23 2100)  Has TPA been given?  No If patient is a Neuro Trauma and patient is going to OR before floor call report to 4N Charge nurse: [phone] or [phone]   R Recommendations: See Admitting Provider Note  Report given to: [facility] 5W Nurse   Additional Notes:

## 2023-11-08 NOTE — Progress Notes (Signed)
 PROGRESS NOTE                                                                                                                                                                                                             Patient Demographics:    Erica Graham, is a 58 y.o. female, DOB - 1966/03/30, ZOX:[id number]  Outpatient Primary MD for the patient is Pcp, No    LOS - 0  Admit date - 11/07/2023    Chief Complaint  Patient presents with   Aphasia   Altered Mental Status       Brief Narrative (HPI from H&P)   58 y.o. Caucasian female with medical history significant for anxiety, arthritis, depression, fibromyalgia, GERD, hypertension, dyslipidemia and type 2 diabetes mellitus, who presented to the emergency room with acute onset of altered mental status, staring into space and expressive dysphasia with inability to communicate since yesterday.  This started around 1:40 PM on Saturday.  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  She was admitted to [facility] and transferred to [facility] for further care.   Subjective:    Erica Graham today has, No headache, No chest pain, No abdominal pain - No Nausea, No new weakness tingling or numbness, no SOB, speech is not at her baseline.   Assessment  & Plan :    Acute CVA (cerebrovascular accident) (HCC) affecting left frontal lobe and basal ganglia.  - Seen by neurology, symptoms mostly dysarthria, signed window of thrombectomy or TNA, full stroke workup, currently on DAPT, has been loaded with Plavix 300 mg x 1 as well.  Vascular surgery has also been consulted for underlying early occlusive left ICA stenosis along with severe multiple vessel arterial disease.  Acute kidney injury superimposed on chronic kidney disease stage IIIa.  Baseline creatinine around 1.3.   Some element of dehydration, also received IV dye for CTA, avoid further nephrotoxins in the short-term, hydrate and monitor.  Essential hypertension  - We will allow permissive hypertension. We will hold off Cozaar.  Morbid Obesity with BMI of 40.  Follow with PCP for weight loss.    Controlled type 2 diabetes mellitus without complication, without long-term current use of insulin (HCC)  - The patient will be placed  on supplemental coverage with NovoLog.   Lab Results  Component Value Date   HGBA1C 9.1 (H) 11/08/2023   CBG (last 3)  Recent Labs    11/08/23 0213 11/08/23 0857  GLUCAP 165* 181*   Lab Results  Component Value Date   CHOL 155 11/08/2023   HDL 26 (L) 11/08/2023   LDLCALC 100 (H) 11/08/2023   TRIG 145 11/08/2023   CHOLHDL 6.0 11/08/2023        Condition -  Guarded  Family Communication  : None present  Code Status :  Full  Consults  :  VVS, Neuro  PUD Prophylaxis :    Procedures  :     CTA - 1. Critically stenotic and nearly occlusive left ICA origin with only a tiny stream of contrast visible. More distal ICA is small, but remains patent. 2. Severe bilateral paraclinoid ICA stenosis. 3. Severe right vertebral artery origin stenosis. 4. Extensive atherosclerosis of the left common carotid artery with multifocal moderate to severe stenosis. 5. Probable moderate stenosis of the left subclavian artery, although motion limits assessment. 6.  Aortic Atherosclerosis (ICD10-I70.0).  CT -   Multiple foci of hypoattenuation and left frontal lobe and basal ganglia concerning for acute or subacute infarcts.       Disposition Plan  :    Status is: Observation   DVT Prophylaxis  :    enoxaparin (LOVENOX) injection 40 mg Start: 11/08/23 1000    Lab Results  Component Value Date   PLT 158 11/08/2023    Diet :  Diet Order             Diet heart healthy/carb modified Room service appropriate? Yes; Fluid consistency: Thin  Diet effective now                     Inpatient Medications  Scheduled Meds:   stroke: early stages of recovery book   Does not apply Once   aspirin EC  81 mg Oral Daily   clopidogrel  75 mg Oral Daily   DULoxetine  30 mg Oral Daily   enoxaparin (LOVENOX) injection  40 mg Subcutaneous Q24H   insulin aspart  0-5 Units Subcutaneous QHS   insulin aspart  0-6 Units Subcutaneous TID WC   rosuvastatin  20 mg Oral Daily   Vitamin D3  5,000 Units Oral Daily   Continuous Infusions:  sodium chloride 40 mL/hr at 11/08/23 0035   PRN Meds:.acetaminophen **OR** acetaminophen (TYLENOL) oral liquid 160 mg/5 mL **OR** acetaminophen, albuterol, ondansetron (ZOFRAN) IV, polyvinyl alcohol, senna-docusate, traZODone  Antibiotics  :    Anti-infectives (From admission, onward)    None         Objective:   Vitals:   11/08/23 0345 11/08/23 0415 11/08/23 0445 11/08/23 0600  BP: 116/74 (!) 149/86 (!) 137/56 (!) 160/80  Pulse: 69 80 78 77  Resp: 18 13 20  (!) 22  Temp:  98.6 F (37 C)  98.3 F (36.8 C)  TempSrc:  Oral  Oral  SpO2: 97% 97% 95% 95%  Weight:    114.2 kg  Height:    5\' 6"  (1.676 m)    Wt Readings from Last 3 Encounters:  11/08/23 114.2 kg  07/15/17 110.2 kg  06/03/17 108.4 kg     Intake/Output Summary (Last 24 hours) at 11/08/2023 0920 Last data filed at 11/08/2023 0600 Gross per 24 hour  Intake 1216.67 ml  Output --  Net 1216.67 ml     Physical Exam  Awake  Alert, No new F.N deficits, Normal affect, mild dysarthria [place].AT,PERRAL Supple Neck, No JVD,   Symmetrical Chest wall movement, Good air movement bilaterally, CTAB RRR,No Gallops,Rubs or new Murmurs,  +ve B.Sounds, Abd Soft, No tenderness,   No Cyanosis, Clubbing or edema     Data Review:    Recent Labs  Lab 11/07/23 2053 11/08/23 0820  WBC 9.1 7.4  HGB 15.9* 15.1*  HCT 46.9* 44.6  PLT 179 158  MCV 91.8 91.6  MCH 31.1 31.0  MCHC 33.9 33.9  RDW 13.4 13.5  LYMPHSABS 1.6 0.9  MONOABS 0.6 0.5  EOSABS 0.0 0.0  BASOSABS  0.1 0.0    Recent Labs  Lab 11/07/23 2053 11/08/23 0820  NA 131*  --   K 4.2  --   CL 102  --   CO2 18*  --   ANIONGAP 11  --   GLUCOSE 155*  --   BUN 35*  --   CREATININE 2.15*  --   AST 18  --   ALT 25  --   ALKPHOS 96  --   BILITOT 0.8  --   ALBUMIN 3.5  --   INR 1.0  --   HGBA1C  --  9.1*  CALCIUM 8.8*  --       Recent Labs  Lab 11/07/23 2053 11/08/23 0820  INR 1.0  --   HGBA1C  --  9.1*  CALCIUM 8.8*  --     --------------------------------------------------------------------------------------------------------------- Lab Results  Component Value Date   CHOL 155 11/08/2023   HDL 26 (L) 11/08/2023   LDLCALC 100 (H) 11/08/2023   TRIG 145 11/08/2023   CHOLHDL 6.0 11/08/2023    Lab Results  Component Value Date   HGBA1C 9.1 (H) 11/08/2023    Radiology Report CT ANGIO HEAD NECK W WO CM Result Date: 11/07/2023 CLINICAL DATA:  Stroke/TIA, determine embolic source EXAM: CT ANGIOGRAPHY HEAD AND NECK WITH AND WITHOUT CONTRAST TECHNIQUE: Multidetector CT imaging of the head and neck was performed using the standard protocol during bolus administration of intravenous contrast. Multiplanar CT image reconstructions and MIPs were obtained to evaluate the vascular anatomy. Carotid stenosis measurements (when applicable) are obtained utilizing NASCET criteria, using the distal internal carotid diameter as the denominator. RADIATION DOSE REDUCTION: This exam was performed according to the departmental dose-optimization program which includes automated exposure control, adjustment of the mA and/or kV according to patient size and/or use of iterative reconstruction technique. CONTRAST:  60mL OMNIPAQUE IOHEXOL 350 MG/ML SOLN COMPARISON:  CT head from earlier today. FINDINGS: CTA NECK FINDINGS Aortic arch: Aortic atherosclerosis. Great vessel origins are patent. Motion limits evaluation for stenosis with probable moderate stenosis of the left proximal subclavian artery. Right  carotid system: Atherosclerosis at the carotid bifurcation without greater than 50% stenosis. Left carotid system: Extensive atherosclerosis of the common carotid artery with multifocal moderate to severe stenosis. Critically stenotic and nearly occlusive left ICA origin with only a tiny stream of contrast visible. More distal ICA in the neck is small, but remains patent. Vertebral arteries: Right dominant. Severe stenosis of the right vertebral artery origin. Left vertebral artery is patent without definite high-grade stenosis although evaluation at the origin is limited due to motion. Skeleton: No acute abnormality on limited assessment. Other neck: No acute abnormality on limited assessment. Upper chest: Visualized lung apices are clear. Review of the MIP images confirms the above findings CTA HEAD FINDINGS Anterior circulation: Small left ICA. Severe bilateral paraclinoid ICA stenosis. Bilateral MCAs are patent without proximal  high-grade stenosis. 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 Right A1 ACA and bilateral A2 ACAs are patent proximally. Limited distal evaluation due to motion. Posterior circulation: Bilateral intradural vertebral arteries, basilar artery and bilateral posterior cerebral arteries are patent without proximal high-grade stenosis. Atherosclerotic irregularity of the PCAs bilaterally. Venous sinuses: As permitted by contrast timing, patent. Review of the MIP images confirms the above findings IMPRESSION: 1. Critically stenotic and nearly occlusive left ICA origin with only a tiny stream of contrast visible. More distal ICA is small, but remains patent. 2. Severe bilateral paraclinoid ICA stenosis. 3. Severe right vertebral artery origin stenosis. 4. Extensive atherosclerosis of the left common carotid artery with multifocal moderate to severe stenosis. 5. Probable moderate stenosis of the left subclavian artery, although motion limits assessment. 6.  Aortic Atherosclerosis  (ICD10-I70.0). Findings discussed with Dr. Estell Harpin via telephone at 11:04 PM. Electronically Signed   By: Feliberto Harts M.D.   On: 11/07/2023 23:06   CT Head Wo Contrast Result Date: 11/07/2023 CLINICAL DATA:  Altered mental status, aphasia, memory loss, slurred speech EXAM: CT HEAD WITHOUT CONTRAST TECHNIQUE: Contiguous axial images were obtained from the base of the skull through the vertex without intravenous contrast. RADIATION DOSE REDUCTION: This exam was performed according to the departmental dose-optimization program which includes automated exposure control, adjustment of the mA and/or kV according to patient size and/or use of iterative reconstruction technique. COMPARISON:  None Available. FINDINGS: Brain: Hypoattenuation within the superior left frontal lobe near the vertex (circa series 2/image 25 and 4/31) concerning for acute or subacute infarcts. Additional foci of hypoattenuation in the left basal ganglia (2/17 and 2/15). Vascular: No hyperdense vessel or unexpected calcification. Skull: Normal. Negative for fracture or focal lesion. Sinuses/Orbits: Moderate opacification of the mastoid air cells bilaterally. The paranasal sinuses are well aerated. Other: None. IMPRESSION: Multiple foci of hypoattenuation and left frontal lobe and basal ganglia concerning for acute or subacute infarcts. Critical Value/emergent results were called by telephone at the time of interpretation on 11/07/2023 at 9:35 pm to provider [facility] ZAMMIT , who verbally acknowledged these results. Electronically Signed   By: Minerva Fester M.D.   On: 11/07/2023 21:38     Signature  -   Susa Raring M.D on 11/08/2023 at 9:20 AM   -  To page go to [email or web address]

## 2023-11-08 NOTE — Progress Notes (Addendum)
 STROKE TEAM PROGRESS NOTE   INTERIM HISTORY/SUBJECTIVE VVS consulted and plan carotid endarterectomy 11/15/2023 Loaded with ASA 325 and plavix 300mg  3/30 She is still having some hesitancy with her speech but no focal weakness.  OBJECTIVE  CBC    Component Value Date/Time   WBC 7.4 11/08/2023 0820   RBC 4.87 11/08/2023 0820   HGB 15.1 (H) 11/08/2023 0820   HGB 15.8 03/05/2016 1127   HCT 44.6 11/08/2023 0820   HCT 45.7 03/05/2016 1127   PLT 158 11/08/2023 0820   PLT 173 03/05/2016 1127   MCV 91.6 11/08/2023 0820   MCV 95 03/05/2016 1127   MCH 31.0 11/08/2023 0820   MCHC 33.9 11/08/2023 0820   RDW 13.5 11/08/2023 0820   RDW 13.5 03/05/2016 1127   LYMPHSABS 0.9 11/08/2023 0820   MONOABS 0.5 11/08/2023 0820   EOSABS 0.0 11/08/2023 0820   BASOSABS 0.0 11/08/2023 0820    BMET    Component Value Date/Time   NA 131 (L) 11/07/2023 2053   NA 139 04/15/2017 1007   K 4.2 11/07/2023 2053   CL 102 11/07/2023 2053   CO2 18 (L) 11/07/2023 2053   GLUCOSE 155 (H) 11/07/2023 2053   BUN 35 (H) 11/07/2023 2053   BUN 16 04/15/2017 1007   CREATININE 2.15 (H) 11/07/2023 2053   CREATININE 1.29 (H) 05/27/2017 0831   CALCIUM 8.8 (L) 11/07/2023 2053   GFRNONAA 26 (L) 11/07/2023 2053   GFRNONAA 48 (L) 05/27/2017 0831    IMAGING past 24 hours CT ANGIO HEAD NECK W WO CM Result Date: 11/07/2023 CLINICAL DATA:  Stroke/TIA, determine embolic source EXAM: CT ANGIOGRAPHY HEAD AND NECK WITH AND WITHOUT CONTRAST TECHNIQUE: Multidetector CT imaging of the head and neck was performed using the standard protocol during bolus administration of intravenous contrast. Multiplanar CT image reconstructions and MIPs were obtained to evaluate the vascular anatomy. Carotid stenosis measurements (when applicable) are obtained utilizing NASCET criteria, using the distal internal carotid diameter as the denominator. RADIATION DOSE REDUCTION: This exam was performed according to the departmental dose-optimization  program which includes automated exposure control, adjustment of the mA and/or kV according to patient size and/or use of iterative reconstruction technique. CONTRAST:  60mL OMNIPAQUE IOHEXOL 350 MG/ML SOLN COMPARISON:  CT head from earlier today. FINDINGS: CTA NECK FINDINGS Aortic arch: Aortic atherosclerosis. Great vessel origins are patent. Motion limits evaluation for stenosis with probable moderate stenosis of the left proximal subclavian artery. Right carotid system: Atherosclerosis at the carotid bifurcation without greater than 50% stenosis. Left carotid system: Extensive atherosclerosis of the common carotid artery with multifocal moderate to severe stenosis. Critically stenotic and nearly occlusive left ICA origin with only a tiny stream of contrast visible. More distal ICA in the neck is small, but remains patent. Vertebral arteries: Right dominant. Severe stenosis of the right vertebral artery origin. Left vertebral artery is patent without definite high-grade stenosis although evaluation at the origin is limited due to motion. Skeleton: No acute abnormality on limited assessment. Other neck: No acute abnormality on limited assessment. Upper chest: Visualized lung apices are clear. Review of the MIP images confirms the above findings CTA HEAD FINDINGS Anterior circulation: Small left ICA. Severe bilateral paraclinoid ICA stenosis. Bilateral MCAs are patent without proximal high-grade stenosis. 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 Right A1 ACA and bilateral A2 ACAs are patent proximally. Limited distal evaluation due to motion. Posterior circulation: Bilateral intradural vertebral arteries, basilar artery and bilateral posterior cerebral arteries are patent without proximal high-grade stenosis. Atherosclerotic irregularity  of the PCAs bilaterally. Venous sinuses: As permitted by contrast timing, patent. Review of the MIP images confirms the above findings IMPRESSION: 1. Critically  stenotic and nearly occlusive left ICA origin with only a tiny stream of contrast visible. More distal ICA is small, but remains patent. 2. Severe bilateral paraclinoid ICA stenosis. 3. Severe right vertebral artery origin stenosis. 4. Extensive atherosclerosis of the left common carotid artery with multifocal moderate to severe stenosis. 5. Probable moderate stenosis of the left subclavian artery, although motion limits assessment. 6.  Aortic Atherosclerosis (ICD10-I70.0). Findings discussed with Dr. Estell Harpin via telephone at 11:04 PM. Electronically Signed   By: Feliberto Harts M.D.   On: 11/07/2023 23:06   CT Head Wo Contrast Result Date: 11/07/2023 CLINICAL DATA:  Altered mental status, aphasia, memory loss, slurred speech EXAM: CT HEAD WITHOUT CONTRAST TECHNIQUE: Contiguous axial images were obtained from the base of the skull through the vertex without intravenous contrast. RADIATION DOSE REDUCTION: This exam was performed according to the departmental dose-optimization program which includes automated exposure control, adjustment of the mA and/or kV according to patient size and/or use of iterative reconstruction technique. COMPARISON:  None Available. FINDINGS: Brain: Hypoattenuation within the superior left frontal lobe near the vertex (circa series 2/image 25 and 4/31) concerning for acute or subacute infarcts. Additional foci of hypoattenuation in the left basal ganglia (2/17 and 2/15). Vascular: No hyperdense vessel or unexpected calcification. Skull: Normal. Negative for fracture or focal lesion. Sinuses/Orbits: Moderate opacification of the mastoid air cells bilaterally. The paranasal sinuses are well aerated. Other: None. IMPRESSION: Multiple foci of hypoattenuation and left frontal lobe and basal ganglia concerning for acute or subacute infarcts. Critical Value/emergent results were called by telephone at the time of interpretation on 11/07/2023 at 9:35 pm to provider [facility] ZAMMIT , who verbally  acknowledged these results. Electronically Signed   By: Minerva Fester M.D.   On: 11/07/2023 21:38    Vitals:   11/08/23 0345 11/08/23 0415 11/08/23 0445 11/08/23 0600  BP: 116/74 (!) 149/86 (!) 137/56 (!) 160/80  Pulse: 69 80 78 77  Resp: 18 13 20  (!) 22  Temp:  98.6 F (37 C)  98.3 F (36.8 C)  TempSrc:  Oral  Oral  SpO2: 97% 97% 95% 95%  Weight:    114.2 kg  Height:    5\' 6"  (1.676 m)     PHYSICAL EXAM General:  Alert, well-nourished, well-developed patient in no acute distress Psych:  Mood and affect appropriate for situation CV: Regular rate and rhythm on monitor Respiratory:  Regular, unlabored respirations on room air GI: Abdomen soft and nontender   NEURO:  Mental Status: AA&Ox3, patient is able to give clear and coherent history Speech/Language: hesitancy of speech with intermittent word finding difficulties.  Repetition and naming intact.   Cranial Nerves:  II: PERRL. Visual fields full.  III, IV, VI: EOMI. Eyelids elevate symmetrically.  V: Sensation is intact to light touch and symmetrical to face.  VII: Face is symmetrical resting and smiling VIII: hearing intact to voice. IX, X: Palate elevates symmetrically. Phonation is normal.  WU:[id number] shrug 5/5. XII: tongue is midline without fasciculations. Motor: 5/5 strength to all muscle groups tested.  Tone: is normal and bulk is normal Sensation- Intact to light touch bilaterally. Extinction absent to light touch to DSS.   Coordination: FTN intact bilaterally, HKS: no ataxia in BLE.No drift.  Gait- deferred  ASSESSMENT/PLAN  Ms. SHARONLEE NINE is a 58 y.o. female with history of prior stroke, depression,  diabetes, GERD, hyperlipidemia, hypertension, obesity, daily smoker who presents with aphasia and mild right-sided weakness.   NIH on Admission 1  Stroke:  left frontal and BG infarct, etiology: Likely large vessel disease from left ICA bulb and siphon severe stenosis Code Stroke CT head Multiple foci  of hypoattenuation and left frontal lobe basal ganglia    CTA head & neck Critically stenotic and nearly occlusive left ICA origin with only a tiny stream of contrast visible. More distal ICA is small, but remains patent. Severe bilateral paraclinoid ICA stenosis. Severe right vertebral artery origin stenosis. Extensive  atherosclerosis of the left common carotid artery with multifocal moderate to severe stenosis. MRI  Pending  2D Echo Pending Carotid US Pending  LDL 100 HgbA1c 9.1 UDS negative VTE prophylaxis - Lovenox  No antithrombotic prior to admission, now on aspirin 81 mg daily and clopidogrel 75 mg daily for 3 months and then as alone given severe stenosis of ICA bulb and siphon. Therapy recommendations: None Disposition:  Pending   Carotid artery stenosis  CTA head & neck Critically stenotic and nearly occlusive left ICA origin with only a tiny stream of contrast visible. Severe bilateral paraclinoid ICA stenosis. Extensive  atherosclerosis of the left common carotid artery with multifocal moderate to severe stenosis.  Right carotid bulb atherosclerosis Vascular surgery consulted Carotid endarterectomy with Dr. Chestine Spore tentatively for 4/7  On DAPT now  Hypertension Home meds:  Cozaar Stable on the high and BP goal 140-160 prior to procedure Long-term BP goal 09/09/1949 given bilateral ICA siphon severe stenosis  Hyperlipidemia Home meds: None LDL 100, goal < 70 Now on Crestor 20 Continue statin at discharge  Diabetes type II Uncontrolled Home meds:  None HgbA1c 9.1, goal < 7.0 CBGs SSI Recommend close follow-up with PCP for better DM control  Tobacco Abuse Smokes 20 packs per year       Ready to quit? Yes Nicotine replacement therapy provided  Other Stroke Risk Factors Obesity, Body mass index is 40.64 kg/m., BMI >/= 30 associated with increased stroke risk, recommend weight loss, diet and exercise as appropriate   Other acute issues AKI on CKD 3B, creatinine  2.15-> 1.97  Hospital day # 0  Patient seen and examined by NP/APP with MD. MD to update note as needed.   Elmer Picker, DNP, FNP-BC [facility] Pager: [phone]  ATTENDING NOTE: I reviewed above note and agree with the assessment and plan. Pt was seen and examined.   Son at bedside.  Patient lying in bed, neuro largely intact except mild expressive aphasia with intermittent hesitancy of speech and word finding difficulty.  CT concerning for left MCA infarcts, CT head and neck left ICA but string sign and bilateral ICA siphon severe stenosis.  Multifocal left CCA moderate to severe stenosis.  Vascular surgery consulted, plan for left CEA next Monday.  Pending MRI, 2D echo and carotid Doppler.  On DAPT and statin.  Avoid low BP, BP goal 140 to 160 before procedure.  Long-term BP goal 130-150 given bilateral ICA siphon stenosis.  PT and OT no recommendation.  Will follow.  For detailed assessment and plan, please refer to above/below as I have made changes wherever appropriate.   Marvel Plan, MD PhD Stroke Neurology 11/08/2023 5:47 PM  I discussed with Dr. Thedore Mins. I spent additional 30 minutes inpatient face-to-face time with the patient, more than 50% of which was spent in counseling and coordination of care, reviewing test results, images and medication, and discussing the diagnosis, treatment  plan and potential prognosis. This patient's care requiresreview of multiple databases, neurological assessment, discussion with family, other specialists and medical decision making of high complexity.      To contact Stroke Continuity provider, please refer to [email or web address]. After hours, contact General Neurology

## 2023-11-08 NOTE — Evaluation (Signed)
 Speech Language Pathology Evaluation Patient Details Name: Erica Graham MRN: [id number] DOB: 08-11-1965 Today's Date: 11/08/2023 Time: 6440-3474 SLP Time Calculation (min) (ACUTE ONLY): 25 min  Problem List:  Patient Active Problem List   Diagnosis Date Noted   Essential hypertension 11/08/2023   Controlled type 2 diabetes mellitus without complication, without long-term current use of insulin (HCC) 11/08/2023   Acute kidney injury superimposed on chronic kidney disease (HCC) 11/08/2023   Acute CVA (cerebrovascular accident) (HCC) 11/07/2023   Abdominal aortic atherosclerosis (HCC) 06/03/2017   Primary osteoarthritis of both hands 04/08/2017   Primary osteoarthritis of both feet 04/08/2017   Primary osteoarthritis of both knees 04/08/2017   scoliosis, lumbar region 04/08/2017   DDD (degenerative disc disease), lumbar 04/08/2017   Positive ANA (antinuclear antibody) 04/08/2017   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   Smoker 03/04/2017   Rheumatoid arthritis (HCC) 12/03/2016   Vitamin D deficiency 10/15/2016   Tubular adenoma of colon 08/21/2016   Fibromyalgia 05/28/2016   Type 2 diabetes, HbA1c goal < 7% (HCC) 04/30/2016   Mixed hyperlipidemia 04/30/2016   Essential hypertension, benign 04/30/2016   Morbid obesity due to excess calories (HCC) 04/30/2016   Anxiety 03/05/2016   Varicose vein of leg 03/05/2016   Vitiligo 03/05/2016   Past Medical History:  Past Medical History:  Diagnosis Date   Acute CVA (cerebrovascular accident) (HCC) 11/07/2023   Anxiety 03/05/2016   Arthritis    rheumatoid   Asthma    Body aches 03/05/2016   Depression    Fibromyalgia    GERD (gastroesophageal reflux disease)    Hyperlipidemia    Hypertension    Obesity    Recent onset of diabetes mellitus (HCC) 03/10/2016   Referred to Dr Fransico Him and rx metformin 500 mg bid and zocor 20 mg 1 daily   Varicose vein of leg 03/05/2016   Vitiligo 03/05/2016   Past Surgical History:  Past Surgical History:   Procedure Laterality Date   CHOLECYSTECTOMY     COLONOSCOPY N/A 08/20/2016   Procedure: COLONOSCOPY;  Surgeon: Malissa Hippo, MD;  Location: [facility];  Service: Endoscopy;  Laterality: N/A;  830    DILATION AND CURETTAGE OF UTERUS     GALLBLADDER SURGERY     LEG SURGERY Left    broke leg   POLYPECTOMY  08/20/2016   Procedure: POLYPECTOMY;  Surgeon: Malissa Hippo, MD;  Location: [facility];  Service: Endoscopy;;  colon   TUBAL LIGATION     HPI:  Patient is a 58 yo female presenting on 11/07/23 to the ED with slurred speech and AMS. All symptoms resolving prior to being assessed by MD. CTA finding acute/subacute left frontal and basal ganglia infarcts. Also noted critically stenosed left ICA at the origin with positive string sign. MRI pending. PMH includes: CVA, anxiety, arthritis, depression, fibromyalgia, GERD, hypertension, dyslipidemia and type 2 diabetes mellitus   Assessment / Plan / Recommendation Clinical Impression  Patient presents with cognitive-linguistic function that is moderately impaired compared to baseline level of function. Patient reports changes to her word finding speed during conversation but otherwise endorses not noticeable change. SLP conducted SLUMS examination with patient scoring 15/30. During functional environmental tasks, patient's receptive language and speech function appeared to be [facility]. During SLUMS, patient exhibited reduced generative naming speed, phonemic paraphasias, and reduced working memory and delayed recall. Patient additionally exhibited impaired sustained and selective attention. For most basic tasks, patient is functional, however as complexity increases, cognitive breakdowns arise. Patient would benefit from continued SLP services  during admission to target all aforementioned defciits. Patient in agreement.    SLP Assessment  SLP Recommendation/Assessment: Patient needs continued Speech Lanaguage Pathology Services SLP Visit Diagnosis:  Dysphagia, oropharyngeal phase (R13.12);Cognitive communication deficit (R41.841)    Recommendations for follow up therapy are one component of a multi-disciplinary discharge planning process, led by the attending physician.  Recommendations may be updated based on patient status, additional functional criteria and insurance authorization.    Follow Up Recommendations  Outpatient SLP    Assistance Recommended at Discharge  Intermittent Supervision/Assistance  Functional Status Assessment Patient has had a recent decline in their functional status and demonstrates the ability to make significant improvements in function in a reasonable and predictable amount of time.  Frequency and Duration min 2x/week  2 weeks      SLP Evaluation Cognition  Overall Cognitive Status: Impaired/Different from baseline Arousal/Alertness: Awake/alert Orientation Level: Oriented X4 Year: 2025 Month: March Day of Week: Correct Attention: Sustained Sustained Attention: Impaired Sustained Attention Impairment: Verbal complex;Functional complex Memory: Impaired Memory Impairment: Retrieval deficit;Storage deficit;Decreased recall of new information Awareness: Impaired Awareness Impairment: Intellectual impairment;Emergent impairment;Anticipatory impairment Problem Solving: Impaired Problem Solving Impairment: Verbal basic;Functional basic Executive Function: Organizing;Self Monitoring;Self Correcting Organizing: Impaired Organizing Impairment: Functional basic Self Monitoring: Impaired Self Monitoring Impairment: Functional basic Self Correcting: Impaired Self Correcting Impairment: Functional basic Safety/Judgment: Appears intact       Comprehension  Auditory Comprehension Overall Auditory Comprehension: Appears within functional limits for tasks assessed Yes/No Questions: Within Functional Limits Commands: Within Functional Limits Conversation: Complex Interfering Components: Attention;Working  memory;Processing speed EffectiveTechniques: Dietitian: Not tested Reading Comprehension Reading Status: Not tested    Expression Expression Primary Mode of Expression: Verbal Verbal Expression Overall Verbal Expression: Impaired Initiation: No impairment Level of Generative/Spontaneous Verbalization: Conversation Repetition: No impairment Naming: Impairment Responsive: Not tested Confrontation: Within functional limits Convergent: Not tested Divergent: Not tested Other Naming Comments: generative: impaired/slowed Verbal Errors: Semantic paraphasias;Phonemic paraphasias Pragmatics: No impairment Interfering Components: Attention Effective Techniques: Phonemic cues;Sentence completion;Articulatory cues Non-Verbal Means of Communication: Not applicable   Oral / Motor  Oral Motor/Sensory Function Overall Oral Motor/Sensory Function: Within functional limits Motor Speech Overall Motor Speech: Appears within functional limits for tasks assessed Intelligibility: Intelligible           Jeannie Done, M.A., CCC-SLP  Yetta Barre 11/08/2023, 12:39 PM

## 2023-11-08 NOTE — Consult Note (Signed)
 NEUROLOGY CONSULT NOTE   Date of service: November 08, 2023 [name] MRN:  [id number] DOB:  25-Jan-1966 Chief Complaint: "Aphasia with some slight right-sided weakness." Requesting Provider: Leroy Sea, MD  History of Present Illness  Erica Graham is a 58 y.o. female with hx of prior stroke, depression, diabetes, GERD, hyperlipidemia, hypertension, obesity, daily smoker who presents with aphasia and mild right-sided weakness.   Patient reports that she noticed her symptoms on Saturday, 11/06/2023 around 1340 p.m.  She was just sitting around when the symptoms started.  She is reports she has been having some trouble with finding the right words and she has been having some trouble with her right hand.  She came to [facility] for further evaluation workup.  She had CT head without contrast which was concerning for acute/subacute left frontal lobe and basal ganglia infarcts.  She had a CT angio of the head and neck with without contrast which demonstrated critically stenosed left ICA at the origin with the positive string sign.  Patient was discussed with neurology and was recommended transfer to [facility] for in person neurology evaluation and for potential evaluation by vascular surgery.  Patient tells me that she goes through a pack of cigarettes in about a day and 1/2 to 2 days.  LKW: 1340 on 11/06/2023 Modified rankin score: 0-Completely asymptomatic and back to baseline post- stroke IV Thrombolysis: Not offered, patient is outside the window.   EVT: Not offered, patient is outside the window.     NIHSS components Score: Comment  1a Level of Conscious 0[x]  1[]  2[]  3[]      1b LOC Questions 0[x]  1[]  2[]       1c LOC Commands 0[x]  1[]  2[]       2 Best Gaze 0[x]  1[]  2[]       3 Visual 0[x]  1[]  2[]  3[]      4 Facial Palsy 0[x]  1[]  2[]  3[]      5a Motor Arm - left 0[x]  1[]  2[]  3[]  4[]  UN[]    5b Motor Arm - Right 0[x]  1[]  2[]  3[]  4[]   UN[]    6a Motor Leg - Left 0[x]  1[]  2[]  3[]  4[]  UN[]    6b Motor Leg - Right 0[x]  1[]  2[]  3[]  4[]  UN[]    7 Limb Ataxia 0[x]  1[]  2[]  3[]  UN[]     8 Sensory 0[x]  1[]  2[]  UN[]      9 Best Language 0[]  1[x]  2[]  3[]      10 Dysarthria 0[x]  1[]  2[]  UN[]      11 Extinct. and Inattention 0[x]  1[]  2[]       TOTAL: 1      ROS  Comprehensive ROS performed and pertinent positives documented in HPI   Past History   Past Medical History:  Diagnosis Date   Acute CVA (cerebrovascular accident) (HCC) 11/07/2023   Anxiety 03/05/2016   Arthritis    rheumatoid   Asthma    Body aches 03/05/2016   Depression    Fibromyalgia    GERD (gastroesophageal reflux disease)    Hyperlipidemia    Hypertension    Obesity    Recent onset of diabetes mellitus (HCC) 03/10/2016   Referred to Dr Fransico Him and rx metformin 500 mg bid and zocor 20 mg 1 daily   Varicose vein of leg 03/05/2016   Vitiligo 03/05/2016    Past Surgical History:  Procedure Laterality Date   CHOLECYSTECTOMY     COLONOSCOPY N/A 08/20/2016   Procedure: COLONOSCOPY;  Surgeon: Malissa Hippo, MD;  Location: [facility];  Service: Endoscopy;  Laterality: N/A;  830    DILATION AND CURETTAGE OF UTERUS     GALLBLADDER SURGERY     LEG SURGERY Left    broke leg   POLYPECTOMY  08/20/2016   Procedure: POLYPECTOMY;  Surgeon: Malissa Hippo, MD;  Location: [facility];  Service: Endoscopy;;  colon   TUBAL LIGATION      Family History: Family History  Problem Relation Age of Onset   Cancer Paternal Grandfather        prostate   Diabetes Paternal Grandmother    Emphysema Maternal Grandmother    Cancer Maternal Grandmother        lung   Alcohol abuse Maternal Grandfather    Diabetes Father    Hypertension Mother    Diabetes Mother    Crohn's disease Sister     Social History  reports that she has been smoking cigarettes. She has a 20 pack-year smoking history. She has never used smokeless tobacco. She reports current alcohol use. She reports  that she does not use drugs.  Allergies  Allergen Reactions   Lipitor [Atorvastatin] Other (See Comments)    Myalgia and joint pain    Medications   Current Facility-Administered Medications:     stroke: early stages of recovery book, , Does not apply, Once, Mansy, Jan A, MD   0.9 %  sodium chloride infusion, , Intravenous, Continuous, Mansy, Vernetta Honey, MD, Last Rate: 40 mL/hr at 11/08/23 0035, New Bag at 11/08/23 0981   acetaminophen (TYLENOL) tablet 650 mg, 650 mg, Oral, Q4H PRN **OR** acetaminophen (TYLENOL) 160 MG/5ML solution 650 mg, 650 mg, Per Tube, Q4H PRN **OR** acetaminophen (TYLENOL) suppository 650 mg, 650 mg, Rectal, Q4H PRN, Mansy, Jan A, MD   albuterol (PROVENTIL) (2.5 MG/3ML) 0.083% nebulizer solution 2.5 mg, 2.5 mg, Nebulization, Q6H PRN, Mansy, Jan A, MD   aspirin tablet 325 mg, 325 mg, Oral, Daily, Erick Blinks, MD, 325 mg at 11/07/23 2316   DULoxetine (CYMBALTA) DR capsule 30 mg, 30 mg, Oral, Daily, Mansy, Jan A, MD   enoxaparin (LOVENOX) injection 40 mg, 40 mg, Subcutaneous, Q24H, Mansy, Jan A, MD   insulin aspart (novoLOG) injection 0-5 Units, 0-5 Units, Subcutaneous, QHS, Sundil, Subrina, MD   insulin aspart (novoLOG) injection 0-6 Units, 0-6 Units, Subcutaneous, TID WC, Sundil, Subrina, MD   ondansetron [facility]) injection 4 mg, 4 mg, Intravenous, Q4H PRN, Mansy, Jan A, MD   polyvinyl alcohol (LIQUIFILM TEARS) 1.4 % ophthalmic solution 1 drop, 1 drop, Both Eyes, PRN, Mansy, Jan A, MD   senna-docusate (Senokot-S) tablet 1 tablet, 1 tablet, Oral, QHS PRN, Mansy, Jan A, MD   traZODone (DESYREL) tablet 25 mg, 25 mg, Oral, QHS PRN, Mansy, Vernetta Honey, MD   Vitamin D3 CAPS 5,000 Units, 5,000 Units, Oral, Daily, Mansy, Jan A, MD  Vitals   Vitals:   11/08/23 0345 11/08/23 0415 11/08/23 0445 11/08/23 0600  BP: 116/74 (!) 149/86 (!) 137/56 (!) 160/80  Pulse: 69 80 78 77  Resp: 18 13 20  (!) 22  Temp:  98.6 F (37 C)  98.3 F (36.8 C)  TempSrc:  Oral  Oral  SpO2: 97% 97%  95% 95%  Weight:        Body mass index is 38.74 kg/m.  Physical Exam   General: Laying comfortably in bed; in no acute distress.  HENT: Normal oropharynx and mucosa. Normal external appearance of ears and nose.  Neck: Supple, no pain or tenderness  CV:  No JVD. No peripheral edema.  Pulmonary: Symmetric Chest rise. Normal respiratory effort.  Abdomen: Soft to touch, non-tender.  Ext: No cyanosis, edema, or deformity  Skin: No rash. Normal palpation of skin.   Musculoskeletal: Normal digits and nails by inspection. No clubbing.   Neurologic Examination  Mental status/Cognition: Alert, oriented to self, place, month and year, good attention.  Speech/language: Fluent, comprehension intact, names most but not all objects, repetition intact.  Cranial nerves:   CN II Pupils equal and reactive to light, no VF deficits    CN III,IV,VI EOM intact, no gaze preference or deviation, no nystagmus    CN V normal sensation in V1, V2, and V3 segments bilaterally    CN VII no asymmetry, no nasolabial fold flattening    CN VIII normal hearing to speech    CN IX & X normal palatal elevation, no uvular deviation    CN XI 5/5 head turn and 5/5 shoulder shrug bilaterally    CN XII midline tongue protrusion    Motor:  Muscle bulk: Normal, tone normal, pronator drift none tremor none Mvmt Root Nerve  Muscle Right Left Comments  SA C5/6 Ax Deltoid 5 5   EF C5/6 Mc Biceps 5 5   EE C6/7/8 Rad Triceps 5 5   WF C6/7 Med FCR     WE C7/8 PIN ECU     F Ab C8/T1 U ADM/FDI 5 5   HF L1/2/3 Fem Illopsoas 4+ 5   KE L2/3/4 Fem Quad 5 5   DF L4/5 D Peron Tib Ant 5 5   PF S1/2 Tibial Grc/Sol 5 5    Sensation:  Light touch Intact throughout   Pin prick    Temperature    Vibration   Proprioception    Coordination/Complex Motor:  - Finger to Nose intact bilaterally - Heel to shin intact bilaterally - Rapid alternating movement are slowed in right upper extremity - Gait: Deferred for patient  safety. Labs/Imaging/Neurodiagnostic studies   CBC:  Recent Labs  Lab 11-27-23 2053  WBC 9.1  NEUTROABS 6.7  HGB 15.9*  HCT 46.9*  MCV 91.8  PLT 179   Basic Metabolic Panel:  Lab Results  Component Value Date   NA 131 (L) 11/27/23   K 4.2 2023-11-27   CO2 18 (L) 11/27/23   GLUCOSE 155 (H) 11-27-2023   BUN 35 (H) 27-Nov-2023   CREATININE 2.15 (H) 2023-11-27   CALCIUM 8.8 (L) 11-27-2023   GFRNONAA 26 (L) 27-Nov-2023   GFRAA 55 (L) 05/27/2017   Lipid Panel:  Lab Results  Component Value Date   LDLCALC 100 (H) 11/08/2023   HgbA1c:  Lab Results  Component Value Date   HGBA1C 5.7 (H) 05/27/2017   Urine Drug Screen: No results found for: "LABOPIA", "COCAINSCRNUR", "LABBENZ", "AMPHETMU", "THCU", "LABBARB"  Alcohol Level No results found for: "ETH" INR  Lab Results  Component Value Date   INR 1.0 11-27-2023   APTT No results found for: "APTT" AED levels: No results found for: "PHENYTOIN", "ZONISAMIDE", "LAMOTRIGINE", "LEVETIRACETA"  CT Head without contrast(Personally reviewed): Multiple foci of hypoattenuation and left frontal lobe and basal ganglia concerning for acute or subacute infarcts.  CT angio Head and Neck with contrast(Personally reviewed): 1. Critically stenotic and nearly occlusive left ICA origin with only a tiny stream of contrast visible. More distal ICA is small, but remains patent. 2. Severe bilateral paraclinoid ICA stenosis. 3. Severe right vertebral artery origin stenosis. 4. Extensive atherosclerosis of the left common carotid artery with multifocal  moderate to severe stenosis. 5. Probable moderate stenosis of the left subclavian artery, although motion limits assessment.  MRI Brain: Pending   ASSESSMENT   Erica Graham is a 58 y.o. female with hx of prior stroke, depression, diabetes, GERD, hyperlipidemia, hypertension, obesity, daily smoker who presents with aphasia and mild right-sided weakness.  She is outside the window for  [facility] and thrombectomy.  She was found to have acute/subacute left frontal and basal ganglia infarcts.  She was also found to have critically stenosed left ICA at the origin with positive string sign.  RECOMMENDATIONS  - Frequent Neuro checks per stroke unit protocol - Recommend brain imaging with MRI Brain without contrast - Recommend obtaining TTE  - Recommend obtaining Lipid panel with LDL - Please start statin if LDL > 70 - Recommend HbA1c to evaluate for diabetes and how well it is controlled. - Antithrombotic -aspirin 325 mg once along with Plavix 300 mg once, followed by aspirin 81 mg daily and Plavix 75 mg daily for 21 days, followed by aspirin 81 mg daily alone. - Recommend DVT ppx - SBP goal - permissive hypertension first 24 h < 220/110. Held home meds.  - Recommend Telemetry monitoring for arrythmia - Recommend bedside swallow screen prior to PO intake. - Stroke education booklet - Recommend PT/OT/SLP consult - I counseled her extensively on the importance of quitting smoking to reduce risk of strokes in the future. -Consult vascular surgery in a.m. after discussion with the stroke team in the morning. ______________________________________________________________________    Signed, Erick Blinks, MD [facility]

## 2023-11-08 NOTE — Assessment & Plan Note (Signed)
-   The patient will be placed on supplemental coverage with NovoLog. - We will hold off metformin. 

## 2023-11-08 NOTE — Consult Note (Addendum)
 Hospital Consult    Reason for Consult:  symptomatic left ICA stenosis  Requesting Physician:  Dr. Thedore Mins MRN #:  [id number]  History of Present Illness: This is a 58 y.o. female with pertinent past medical history of HLD, HTN, Depression, Anxiety, GERD, Asthma, chronic tobacco use, and T2DM who presented to ER at [facility] on Saturday with slurred speech and trouble word finding. She denies prior history of TIA or stroke. She denies any associated visual changes, upper or lower extremity weakness or numbness, or facial droop. She has had no prior neck radiation or surgery. She currently smokes 1/2 ppd. She had CTA head showing multiple foci of hypo attenuation and left frontal lobe basal ganglia concerning for subacute infarcts. CTA of head and neck revealing critical left ICA stenosis. MRI brain is pending. Vascular surgery was consulted for further evaluation and management.   Past Medical History:  Diagnosis Date   Acute CVA (cerebrovascular accident) (HCC) 11/07/2023   Anxiety 03/05/2016   Arthritis    rheumatoid   Asthma    Body aches 03/05/2016   Depression    Fibromyalgia    GERD (gastroesophageal reflux disease)    Hyperlipidemia    Hypertension    Obesity    Recent onset of diabetes mellitus (HCC) 03/10/2016   Referred to Dr Fransico Him and rx metformin 500 mg bid and zocor 20 mg 1 daily   Varicose vein of leg 03/05/2016   Vitiligo 03/05/2016    Past Surgical History:  Procedure Laterality Date   CHOLECYSTECTOMY     COLONOSCOPY N/A 08/20/2016   Procedure: COLONOSCOPY;  Surgeon: Malissa Hippo, MD;  Location: [facility];  Service: Endoscopy;  Laterality: N/A;  830    DILATION AND CURETTAGE OF UTERUS     GALLBLADDER SURGERY     LEG SURGERY Left    broke leg   POLYPECTOMY  08/20/2016   Procedure: POLYPECTOMY;  Surgeon: Malissa Hippo, MD;  Location: [facility];  Service: Endoscopy;;  colon   TUBAL LIGATION      Allergies  Allergen Reactions   Lipitor [Atorvastatin]  Other (See Comments)    Myalgia and joint pain    Prior to Admission medications   Medication Sig Start Date End Date Taking? Authorizing Provider  albuterol (PROVENTIL HFA;VENTOLIN HFA) 108 (90 Base) MCG/ACT inhaler Inhale 2 puffs into the lungs every 6 (six) hours as needed for wheezing or shortness of breath. 05/28/16  Yes Eustace Moore, MD  Cholecalciferol (VITAMIN D3) 5000 units CAPS Take 1 capsule (5,000 Units total) by mouth daily. 07/16/16  Yes Nida, Denman George, MD  ibuprofen (ADVIL,MOTRIN) 200 MG tablet Take 400 mg by mouth as needed for headache or moderate pain.    Yes [provider]  diphenhydrAMINE (BENADRYL) 25 MG tablet Take 50 mg by mouth at bedtime as needed for sleep.  Patient not taking: Reported on 11/08/2023    [provider]  DULoxetine (CYMBALTA) 30 MG capsule Take 1 capsule (30 mg total) by mouth daily. Patient not taking: Reported on 11/08/2023 06/03/17   Eustace Moore, MD  losartan (COZAAR) 25 MG tablet Take 1 tablet (25 mg total) by mouth daily. Patient not taking: Reported on 11/08/2023 12/03/16   Eustace Moore, MD  Polyethyl Glycol-Propyl Glycol (SYSTANE OP) Apply 1 drop to eye as needed.  Patient not taking: Reported on 11/08/2023    [provider]  predniSONE (DELTASONE) 5 MG tablet Take 1 tablet (5 mg total) by mouth daily with  breakfast. Patient not taking: Reported on 11/08/2023 07/15/17   Eustace Moore, MD    Social History   Socioeconomic History   Marital status: Divorced    Spouse name: Not on file   Number of children: 2   Years of education: 13   Highest education level: Not on file  Occupational History   Occupation: hairdresser    Comment: self  Tobacco Use   Smoking status: Every Day    Current packs/day: 1.00    Average packs/day: 1 pack/day for 20.0 years (20.0 ttl pk-yrs)    Types: Cigarettes   Smokeless tobacco: Never  Vaping Use   Vaping status: Never Used  Substance and Sexual  Activity   Alcohol use: Yes    Comment: occ 1 monthly    Drug use: No   Sexual activity: Not Currently    Birth control/protection: Surgical    Comment: tubal  Other Topics Concern   Not on file  Social History Narrative   Lives at home with two children, son is 42, daughter is 103   Social Drivers of Corporate investment banker Strain: Not on file  Food Insecurity: No Food Insecurity (11/08/2023)   Hunger Vital Sign    Worried About Running Out of Food in the Last Year: Never true    Ran Out of Food in the Last Year: Never true  Transportation Needs: No Transportation Needs (11/08/2023)   PRAPARE - Administrator, Civil Service (Medical): No    Lack of Transportation (Non-Medical): No  Physical Activity: Not on file  Stress: Not on file  Social Connections: Not on file  Intimate Partner Violence: Not At Risk (11/08/2023)   Humiliation, Afraid, Rape, and Kick questionnaire    Fear of Current or Ex-Partner: No    Emotionally Abused: No    Physically Abused: No    Sexually Abused: No     Family History  Problem Relation Age of Onset   Cancer Paternal Grandfather        prostate   Diabetes Paternal Grandmother    Emphysema Maternal Grandmother    Cancer Maternal Grandmother        lung   Alcohol abuse Maternal Grandfather    Diabetes Father    Hypertension Mother    Diabetes Mother    Crohn's disease Sister     ROS: Otherwise negative unless mentioned in HPI  Physical Examination  Vitals:   11/08/23 0445 11/08/23 0600  BP: (!) 137/56 (!) 160/80  Pulse: 78 77  Resp: 20 (!) 22  Temp:  98.3 F (36.8 C)  SpO2: 95% 95%   Body mass index is 40.64 kg/m.  General:  WDWN in NAD Gait: normal, observed walking in hall with PT HENT: WNL, normocephalic Pulmonary: normal non-labored breathing, without wheezing Cardiac: regular Abdomen:  soft Vascular Exam/Pulses:Moving extremities without deficits, 5/5 grip strength bilaterally Musculoskeletal: no muscle  wasting or atrophy  Neurologic: A&O X 3;  No focal weakness or paresthesias are detected; speech is coherent. Some trouble with word finding Psychiatric:  The pt has Normal affect.  CBC    Component Value Date/Time   WBC 7.4 11/08/2023 0820   RBC 4.87 11/08/2023 0820   HGB 15.1 (H) 11/08/2023 0820   HGB 15.8 03/05/2016 1127   HCT 44.6 11/08/2023 0820   HCT 45.7 03/05/2016 1127   PLT 158 11/08/2023 0820   PLT 173 03/05/2016 1127   MCV 91.6 11/08/2023 0820   MCV 95 03/05/2016 1127  MCH 31.0 11/08/2023 0820   MCHC 33.9 11/08/2023 0820   RDW 13.5 11/08/2023 0820   RDW 13.5 03/05/2016 1127   LYMPHSABS 0.9 11/08/2023 0820   MONOABS 0.5 11/08/2023 0820   EOSABS 0.0 11/08/2023 0820   BASOSABS 0.0 11/08/2023 0820    BMET    Component Value Date/Time   NA 136 11/08/2023 0820   NA 139 04/15/2017 1007   K 5.0 11/08/2023 0820   CL 107 11/08/2023 0820   CO2 21 (L) 11/08/2023 0820   GLUCOSE 192 (H) 11/08/2023 0820   BUN 29 (H) 11/08/2023 0820   BUN 16 04/15/2017 1007   CREATININE 1.97 (H) 11/08/2023 0820   CREATININE 1.29 (H) 05/27/2017 0831   CALCIUM 8.7 (L) 11/08/2023 0820   GFRNONAA 29 (L) 11/08/2023 0820   GFRNONAA 48 (L) 05/27/2017 0831   GFRAA 55 (L) 05/27/2017 0831    COAGS: Lab Results  Component Value Date   INR 1.0 11/07/2023     Non-Invasive Vascular Imaging:  CT Angiography head and neck with and without contrast: IMPRESSION: 1. Critically stenotic and nearly occlusive left ICA origin with only a tiny stream of contrast visible. More distal ICA is small, but remains patent. 2. Severe bilateral paraclinoid ICA stenosis. 3. Severe right vertebral artery origin stenosis. 4. Extensive atherosclerosis of the left common carotid artery with multifocal moderate to severe stenosis. 5. Probable moderate stenosis of the left subclavian artery,although motion limits assessment. 6.  Aortic Atherosclerosis (ICD10-I70.0).  CT Head without  Contrast: IMPRESSION: Multiple foci of hypoattenuation and left frontal lobe and basal ganglia concerning for acute or subacute infarcts.    Statin:  Yes.   Beta Blocker:  No. Aspirin:  Yes.   ACEI:  No. ARB:  Yes.   CCB use:  No Other antiplatelets/anticoagulants:  Yes.   Plavix   ASSESSMENT/PLAN: This is a 58 y.o. female who presented with slurred speech found to have acute infarcts in left frontal and basal ganglia. She was outside of TPA window. She has been started on DAPT. CTA head and neck showing critical high grade stenosis of left ICA. MRI and Carotid duplex pending. Recommendation would be for left CEA with possible retrograde common carotid artery stenting. She is not a candidate for left TCAR due to the amount of calcification in her ICA and CCA.  - Continue Plavix, Aspirin, statin - Plan will be for left CEA with Dr. Chestine Spore on Monday 4/7 - Okay to discharge home prior and will bring her back on Monday for surgery   Graceann Congress PA-C [facility] [phone] 11/08/2023  10:20 AM  I have seen and evaluated the patient. I agree with the PA note as documented above.  58 year old female with hypertension, hyperlipidemia, diabetes that vascular surgery has been consulted for symptomatic left ICA stenosis.  Presents here as a transfer from [facility].  States she had slurred speech on Saturday.  MRI is still pending.  Back to her neurologic baseline.  CTA neck reviewed with high-grade critical stenosis of the left ICA with severely diseased common carotid artery more proximal with extensive atherosclerosis.  Discussed she is not a stent candidate for TCAR due to the diffuse common carotid disease on the left.  Would benefit from left carotid endarterectomy with some chance of retrograde carotid stenting.  Will tentatively schedule for Monday, April 7.  Now on aspirin Plavix statin.  Once she completes her stroke workup she could be discharged in the interim and  return Monday for surgery.  Discussed guidelines are to offer carotid revascularization within 2 weeks of event.  I will follow-up with her tomorrow.  Discussed indications for surgery or stroke risk reduction.  Discussed 1% stroke risk along with risk of bleeding cranial nerve injury etc.  Cephus Shelling, MD [facility] Office: [phone]

## 2023-11-08 NOTE — Progress Notes (Signed)
 Echocardiogram 2D Echocardiogram has been performed.  Reinaldo Raddle Clete Kuch 11/08/2023, 11:09 AM

## 2023-11-08 NOTE — Assessment & Plan Note (Addendum)
-   The patient will be admitted to an observation medically monitored bed.   - We will follow neuro checks q.4 hours for 24 hours.   - The patient will be placed on aspirin and Plavix.   - Will obtain a brain MRI without contrast as well as 2D echo with bubble study. - A neurology consultation  as well as physical/occupation/speech therapy consults will be obtained in a.m. - I notified Dr. Derry Lory about the patient.   - The patient will be placed on statin therapy and fasting lipids will be checked.

## 2023-11-08 NOTE — Assessment & Plan Note (Addendum)
-   We will allow permissive hypertension. - We will hold off Cozaar.

## 2023-11-08 NOTE — Evaluation (Signed)
 Physical Therapy Evaluation and Discharge Patient Details Name: Erica Graham MRN: [id number] DOB: 1965-11-13 Today's Date: 11/08/2023  History of Present Illness  Patient is a 58 yo female presenting on 11/07/23 to the ED with slurred speech and AMS. All symptoms resolving prior to being assessed by MD. CTA finding acute/subacute left frontal and basal ganglia infarcts. Also noted critically stenosed left ICA at the origin with positive string sign. MRI pending. PMH includes: CVA, anxiety, arthritis, depression, fibromyalgia, GERD, hypertension, dyslipidemia and type 2 diabetes mellitus.   Clinical Impression  Patient evaluated by Physical Therapy with no further acute PT needs identified. Reports some subjective weakness of RLE however strength WNL and symmetric throughout LLE musculature at my time of testing. SILT LEs. Mobilized at a mod I level without any loss of balance today. DGI 21/24 indicating low fall risk, no assistive device utilized. Oriented x4. Flat affect. Educated on BEFAST acronym, findings, and follow-up recs. All education has been completed and the patient has no further questions. See below for any follow-up Physical Therapy or equipment needs. PT is signing off. Thank you for this referral.         If plan is discharge home, recommend the following: Assist for transportation   Can travel by private vehicle        Equipment Recommendations None recommended by PT  Recommendations for Other Services       Functional Status Assessment Patient has not had a recent decline in their functional status     Precautions / Restrictions Precautions Precautions: None Recall of Precautions/Restrictions: Intact Restrictions Weight Bearing Restrictions Per Provider Order: No      Mobility  Bed Mobility Overal bed mobility: Modified Independent             General bed mobility comments: Extra time no assist in/out of bed    Transfers Overall transfer level:  Modified independent                 General transfer comment: Stable upon rising. No assist, no device.    Ambulation/Gait Ambulation/Gait assistance: Modified independent (Device/Increase time) Gait Distance (Feet): 225 Feet Assistive device: None Gait Pattern/deviations: Step-through pattern, Wide base of support Gait velocity: dec Gait velocity interpretation: 1.31 - 2.62 ft/sec, indicative of limited community ambulator   General Gait Details: Grossly stable with minimal sway during higher level dynamic challenges. No physical assist needed, no overt LOB, no assistive device utilized.  Stairs            Wheelchair Mobility     Tilt Bed    Modified Rankin (Stroke Patients Only) Modified Rankin (Stroke Patients Only) Pre-Morbid Rankin Score: No symptoms Modified Rankin: Slight disability     Balance Overall balance assessment: Needs assistance Sitting-balance support: No upper extremity supported, Feet supported Sitting balance-Leahy Scale: Good     Standing balance support: No upper extremity supported, During functional activity Standing balance-Leahy Scale: Good                   Standardized Balance Assessment Standardized Balance Assessment : Dynamic Gait Index   Dynamic Gait Index Level Surface: Normal Change in Gait Speed: Normal Gait with Horizontal Head Turns: Normal Gait with Vertical Head Turns: Mild Impairment Gait and Pivot Turn: Normal Step Over Obstacle: Mild Impairment Step Around Obstacles: Normal Steps: Mild Impairment Total Score: 21       Pertinent Vitals/Pain Pain Assessment Pain Assessment: No/denies pain    Home Living Family/patient expects to be discharged to::  Private residence Living Arrangements: Alone Available Help at Discharge: Family;Available PRN/intermittently Type of Home: House Home Access: Level entry       Home Layout: One level Home Equipment: None Additional Comments: Feels they can  arrange 24/7 supervision initially if needed    Prior Function Prior Level of Function : Driving;Independent/Modified Independent             Mobility Comments: independent ADLs Comments: independent with ADLs and IADLs     Extremity/Trunk Assessment   Upper Extremity Assessment Upper Extremity Assessment: Defer to OT evaluation    Lower Extremity Assessment Lower Extremity Assessment: Overall WFL for tasks assessed (symmetrical at my time of testing)    Cervical / Trunk Assessment Cervical / Trunk Assessment: Other exceptions (increased body habitus)  Communication   Communication Communication: Impaired Factors Affecting Communication: Difficulty expressing self    Cognition Arousal: Alert Behavior During Therapy: WFL for tasks assessed/performed                             Following commands: Intact       Cueing Cueing Techniques: Verbal cues     General Comments General comments (skin integrity, edema, etc.): Educated on BEFAST acronym    Exercises     Assessment/Plan    PT Assessment Patient does not need any further PT services  PT Problem List         PT Treatment Interventions      PT Goals (Current goals can be found in the Care Plan section)  Acute Rehab PT Goals Patient Stated Goal: Go home, be well PT Goal Formulation: All assessment and education complete, DC therapy    Frequency       Co-evaluation               AM-PAC PT "6 Clicks" Mobility  Outcome Measure Help needed turning from your back to your side while in a flat bed without using bedrails?: None Help needed moving from lying on your back to sitting on the side of a flat bed without using bedrails?: None Help needed moving to and from a bed to a chair (including a wheelchair)?: None Help needed standing up from a chair using your arms (e.g., wheelchair or bedside chair)?: None Help needed to walk in hospital room?: None Help needed climbing 3-5 steps  with a railing? : None 6 Click Score: 24    End of Session   Activity Tolerance: Patient tolerated treatment well Patient left: in bed;with call bell/phone within reach;with bed alarm set;with family/visitor present (MD in room)   PT Visit Diagnosis: Other abnormalities of gait and mobility (R26.89);Other symptoms and signs involving the nervous system (R29.898)    Time: 1020-1040 PT Time Calculation (min) (ACUTE ONLY): 20 min   Charges:   PT Evaluation $PT Eval Low Complexity: 1 Low   PT General Charges $$ ACUTE PT VISIT: 1 Visit         Kathlyn Sacramento, PT, DPT [facility]  Rehabilitation Services Physical Therapist Office: [phone] Website: [email or web address]   Berton Mount 11/08/2023, 11:30 AM

## 2023-11-08 NOTE — Assessment & Plan Note (Addendum)
-   This associated with mild metabolic acidosis, and is likely prerenal due to volume depletion and dehydration with associated hyponatremia. - The patient be hydrated with IV normal saline and will follow BMP. - We will avoid nephrotoxins.

## 2023-11-08 NOTE — Plan of Care (Signed)

## 2023-11-09 ENCOUNTER — Telehealth: Payer: Self-pay

## 2023-11-09 DIAGNOSIS — E785 Hyperlipidemia, unspecified: Secondary | ICD-10-CM

## 2023-11-09 DIAGNOSIS — N179 Acute kidney failure, unspecified: Secondary | ICD-10-CM | POA: Diagnosis not present

## 2023-11-09 DIAGNOSIS — Z7902 Long term (current) use of antithrombotics/antiplatelets: Secondary | ICD-10-CM | POA: Diagnosis not present

## 2023-11-09 DIAGNOSIS — E871 Hypo-osmolality and hyponatremia: Secondary | ICD-10-CM | POA: Diagnosis not present

## 2023-11-09 DIAGNOSIS — I6522 Occlusion and stenosis of left carotid artery: Secondary | ICD-10-CM | POA: Diagnosis not present

## 2023-11-09 DIAGNOSIS — I129 Hypertensive chronic kidney disease with stage 1 through stage 4 chronic kidney disease, or unspecified chronic kidney disease: Secondary | ICD-10-CM | POA: Diagnosis not present

## 2023-11-09 DIAGNOSIS — R4702 Dysphasia: Secondary | ICD-10-CM | POA: Diagnosis not present

## 2023-11-09 DIAGNOSIS — Z6841 Body Mass Index (BMI) 40.0 and over, adult: Secondary | ICD-10-CM | POA: Diagnosis not present

## 2023-11-09 DIAGNOSIS — Z7982 Long term (current) use of aspirin: Secondary | ICD-10-CM | POA: Diagnosis not present

## 2023-11-09 DIAGNOSIS — Z7984 Long term (current) use of oral hypoglycemic drugs: Secondary | ICD-10-CM | POA: Diagnosis not present

## 2023-11-09 DIAGNOSIS — J45909 Unspecified asthma, uncomplicated: Secondary | ICD-10-CM | POA: Diagnosis not present

## 2023-11-09 DIAGNOSIS — E1165 Type 2 diabetes mellitus with hyperglycemia: Secondary | ICD-10-CM | POA: Diagnosis not present

## 2023-11-09 DIAGNOSIS — R29701 NIHSS score 1: Secondary | ICD-10-CM | POA: Diagnosis not present

## 2023-11-09 DIAGNOSIS — E872 Acidosis, unspecified: Secondary | ICD-10-CM | POA: Diagnosis not present

## 2023-11-09 DIAGNOSIS — F17210 Nicotine dependence, cigarettes, uncomplicated: Secondary | ICD-10-CM | POA: Diagnosis not present

## 2023-11-09 DIAGNOSIS — E860 Dehydration: Secondary | ICD-10-CM | POA: Diagnosis not present

## 2023-11-09 DIAGNOSIS — I2510 Atherosclerotic heart disease of native coronary artery without angina pectoris: Secondary | ICD-10-CM | POA: Diagnosis not present

## 2023-11-09 DIAGNOSIS — R4701 Aphasia: Secondary | ICD-10-CM | POA: Diagnosis not present

## 2023-11-09 DIAGNOSIS — I63232 Cerebral infarction due to unspecified occlusion or stenosis of left carotid arteries: Secondary | ICD-10-CM | POA: Diagnosis not present

## 2023-11-09 DIAGNOSIS — N1831 Chronic kidney disease, stage 3a: Secondary | ICD-10-CM | POA: Diagnosis not present

## 2023-11-09 DIAGNOSIS — M797 Fibromyalgia: Secondary | ICD-10-CM | POA: Diagnosis not present

## 2023-11-09 DIAGNOSIS — I472 Ventricular tachycardia, unspecified: Secondary | ICD-10-CM | POA: Diagnosis not present

## 2023-11-09 DIAGNOSIS — I63542 Cerebral infarction due to unspecified occlusion or stenosis of left cerebellar artery: Secondary | ICD-10-CM | POA: Diagnosis not present

## 2023-11-09 DIAGNOSIS — R471 Dysarthria and anarthria: Secondary | ICD-10-CM | POA: Diagnosis not present

## 2023-11-09 DIAGNOSIS — E1122 Type 2 diabetes mellitus with diabetic chronic kidney disease: Secondary | ICD-10-CM | POA: Diagnosis not present

## 2023-11-09 DIAGNOSIS — I639 Cerebral infarction, unspecified: Secondary | ICD-10-CM | POA: Diagnosis not present

## 2023-11-09 LAB — BASIC METABOLIC PANEL WITH GFR
Anion gap: 9 (ref 5–15)
BUN: 24 mg/dL — ABNORMAL HIGH (ref 6–20)
CO2: 20 mmol/L — ABNORMAL LOW (ref 22–32)
Calcium: 8.8 mg/dL — ABNORMAL LOW (ref 8.9–10.3)
Chloride: 108 mmol/L (ref 98–111)
Creatinine, Ser: 1.81 mg/dL — ABNORMAL HIGH (ref 0.44–1.00)
GFR, Estimated: 32 mL/min — ABNORMAL LOW (ref >60)
Glucose, Bld: 153 mg/dL — ABNORMAL HIGH (ref 70–99)
Potassium: 4.4 mmol/L (ref 3.5–5.1)
Sodium: 137 mmol/L (ref 135–145)

## 2023-11-09 LAB — GLUCOSE, CAPILLARY
Glucose-Capillary: 163 mg/dL — ABNORMAL HIGH (ref 70–99)
Glucose-Capillary: 174 mg/dL — ABNORMAL HIGH (ref 70–99)
Glucose-Capillary: 105 mg/dL — ABNORMAL HIGH (ref 70–99)
Glucose-Capillary: 169 mg/dL — ABNORMAL HIGH (ref 70–99)

## 2023-11-09 LAB — MAGNESIUM: Magnesium: 2 mg/dL (ref 1.7–2.4)

## 2023-11-09 MED ORDER — SODIUM CHLORIDE 0.9 % IV SOLN: INTRAVENOUS | Status: DC

## 2023-11-09 MED ORDER — METOPROLOL TARTRATE 5 MG/5ML IV SOLN: 2.5000 mg | INTRAVENOUS | Status: DC | PRN

## 2023-11-09 MED ORDER — SODIUM CHLORIDE 0.9 % IV SOLN: INTRAVENOUS | Status: AC

## 2023-11-09 MED ORDER — ACETYLCYSTEINE 20 % IN SOLN
600.0000 mg | Freq: Two times a day (BID) | RESPIRATORY_TRACT | Status: DC
  Filled 2023-11-09 (×4): qty 4

## 2023-11-09 MED ORDER — METOPROLOL TARTRATE 12.5 MG HALF TABLET: 12.5000 mg | ORAL_TABLET | Freq: Two times a day (BID) | ORAL | Status: DC

## 2023-11-09 MED ORDER — MAGNESIUM SULFATE 2 GM/50ML IV SOLN
2.0000 g | Freq: Once | INTRAVENOUS | Status: AC
  Administered 2023-11-09: 2 g via INTRAVENOUS
  Filled 2023-11-09: qty 50

## 2023-11-09 NOTE — Progress Notes (Signed)
 PROGRESS NOTE                                                                                                                                                                                                             Patient Demographics:    Erica Graham, is a 58 y.o. female, DOB - September 09, 1965, EXB:[id number]  Outpatient Primary MD for the patient is Pcp, No    LOS - 0  Admit date - 11/07/2023    Chief Complaint  Patient presents with   Aphasia   Altered Mental Status       Brief Narrative (HPI from H&P)   58 y.o. Caucasian female with medical history significant for anxiety, arthritis, depression, fibromyalgia, GERD, hypertension, dyslipidemia and type 2 diabetes mellitus, who presented to the emergency room with acute onset of altered mental status, staring into space and expressive dysphasia with inability to communicate since yesterday.  This started around 1:40 PM on Saturday.  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  She was admitted to [facility] and transferred to [facility] for further care.   Subjective:   Patient in bed, appears comfortable, denies any headache, no fever, no chest pain or pressure, no shortness of breath , no abdominal pain. No new focal weakness.  Speech much improved.   Assessment  & Plan :    Acute CVA (cerebrovascular accident) (HCC) affecting left frontal lobe and basal ganglia.  - Seen by neurology, symptoms mostly dysarthria, signed window of thrombectomy or TNA, full stroke workup, currently on DAPT, has been loaded with Plavix 300 mg x 1 as well.  Vascular surgery has also been consulted for underlying early occlusive left ICA stenosis along with severe multiple vessel arterial disease, neurosurgery is planning a diagnostic angiogram on 11/10/2023 and based on the angiogram  results will decide if patient requires surgical intervention or not.  Acute kidney injury superimposed on chronic kidney disease stage IIIa ?Marland Kitchen  Baseline creatinine around 1.3 but this is in 2018, she could have progressed her CKD further and this could be close to her baseline.  Some element of dehydration, also received IV dye for CTA, continue gentle hydration, will give few doses of Mucomyst, continue to monitor closely.  Essential hypertension  -  We will allow permissive hypertension. We will hold off Cozaar.  Morbid Obesity with BMI of 40.  Follow with PCP for weight loss.    Controlled type 2 diabetes mellitus without complication, without long-term current use of insulin (HCC)  - The patient will be placed on supplemental coverage with NovoLog.   Lab Results  Component Value Date   HGBA1C 9.1 (H) 11/08/2023   CBG (last 3)  Recent Labs    11/08/23 1221 11/08/23 1812 11/08/23 2109  GLUCAP 152* 141* 190*   Lab Results  Component Value Date   CHOL 155 11/08/2023   HDL 26 (L) 11/08/2023   LDLCALC 100 (H) 11/08/2023   TRIG 145 11/08/2023   CHOLHDL 6.0 11/08/2023        Condition -  Guarded  Family Communication  : updated son over the phone in detail on 11/09/2023, updated him about the angiogram likely happening on 11/10/2023, risks and benefits in the light of underlying renal dysfunction discussed.  Patient and son both would like to proceed for the procedure  Code Status :  Full  Consults  :  VVS, Neuro  PUD Prophylaxis :    Procedures  :     MRI -  Multifocal acute infarcts within the left frontal lobe and left basal ganglia, predominantly ACA territory. No hemorrhage.  Vas.Carotids - Right Carotid: Velocities in the right ICA are consistent with a 1-39% stenosis.   Non-hemodynamically significant plaque <50% noted in the CCA. The ECA appears <50% stenosed. Left Carotid: Velocities in the left ICA are consistent with a 80-99% stenosis.               Occlusion in left  carotid bulb. ECA flows retrograde into bulb, providing flow to ICA. Bidirectional flow seen in proximal ICA.  TTE - 1. Left ventricular ejection fraction, by estimation, is 55 to 60%. The left ventricle has normal function. The left ventricle has no regional wall motion abnormalities. There is moderate concentric left ventricular hypertrophy. Left ventricular diastolic parameters are consistent with Grade I diastolic dysfunction (impaired relaxation). Elevated left atrial pressure.  2. Right ventricular systolic function is normal. The right ventricular size is normal. Tricuspid regurgitation signal is inadequate for assessing PA pressure.  3. Left atrial size was moderately dilated.  4. The mitral valve is degenerative. No evidence of mitral valve regurgitation. Mild mitral stenosis. The mean mitral valve gradient is 4.0 mmHg with average heart rate of 82 bpm. Moderate to severe mitral annular calcification.  5. The aortic valve was not well visualized. Aortic valve regurgitation is not visualized. No aortic stenosis is present.  6. The inferior vena cava is normal in size with greater than 50% respiratory variability, suggesting right atrial pressure of 3 mmHg.  7. Agitated saline contrast bubble study was negative, with no evidence of any interatrial shunt.  CTA - 1. Critically stenotic and nearly occlusive left ICA origin with only a tiny stream of contrast visible. More distal ICA is small, but remains patent. 2. Severe bilateral paraclinoid ICA stenosis. 3. Severe right vertebral artery origin stenosis. 4. Extensive atherosclerosis of the left common carotid artery with multifocal moderate to severe stenosis. 5. Probable moderate stenosis of the left subclavian artery, although motion limits assessment. 6.  Aortic Atherosclerosis (ICD10-I70.0).  CT -   Multiple foci of hypoattenuation and left frontal lobe and basal ganglia concerning for acute or subacute infarcts.       Disposition Plan  :     Status is: Observation  DVT Prophylaxis  :    enoxaparin (LOVENOX) injection 40 mg Start: 11/08/23 1000    Lab Results  Component Value Date   PLT 158 11/08/2023    Diet :  Diet Order             Diet NPO time specified  Diet effective midnight           Diet heart healthy/carb modified Room service appropriate? Yes; Fluid consistency: Thin  Diet effective now                    Inpatient Medications  Scheduled Meds:   stroke: early stages of recovery book   Does not apply Once   aspirin EC  81 mg Oral Daily   cholecalciferol  5,000 Units Oral Daily   clopidogrel  75 mg Oral Daily   enoxaparin (LOVENOX) injection  40 mg Subcutaneous Q24H   insulin aspart  0-5 Units Subcutaneous QHS   insulin aspart  0-6 Units Subcutaneous TID WC   nicotine  21 mg Transdermal Daily   rosuvastatin  20 mg Oral Daily   Continuous Infusions:  sodium chloride     PRN Meds:.acetaminophen **OR** acetaminophen (TYLENOL) oral liquid 160 mg/5 mL **OR** acetaminophen, albuterol, metoprolol tartrate, ondansetron (ZOFRAN) IV, polyvinyl alcohol, senna-docusate, traZODone  Antibiotics  :    Anti-infectives (From admission, onward)    None         Objective:   Vitals:   11/08/23 2021 11/08/23 2323 11/09/23 0103 11/09/23 0423  BP: (!) 186/99 (!) 165/83 (!) 155/100 (!) 181/94  Pulse: 78 79 79 80  Resp: 16 18 13 20   Temp: 98.2 F (36.8 C) 98.2 F (36.8 C)  98.6 F (37 C)  TempSrc: Oral Oral  Oral  SpO2: 97% 98% 96% 94%  Weight:      Height:        Wt Readings from Last 3 Encounters:  11/08/23 114.2 kg  07/15/17 110.2 kg  06/03/17 108.4 kg     Intake/Output Summary (Last 24 hours) at 11/09/2023 0736 Last data filed at 11/09/2023 0300 Gross per 24 hour  Intake 623.33 ml  Output --  Net 623.33 ml     Physical Exam  Awake Alert, No new F.N deficits, Normal affect, mild dysarthria [place].AT,PERRAL Supple Neck, No JVD,   Symmetrical Chest wall movement, Good air  movement bilaterally, CTAB RRR,No Gallops,Rubs or new Murmurs,  +ve B.Sounds, Abd Soft, No tenderness,   No Cyanosis, Clubbing or edema     Data Review:    Recent Labs  Lab 11/07/23 2053 11/08/23 0820  WBC 9.1 7.4  HGB 15.9* 15.1*  HCT 46.9* 44.6  PLT 179 158  MCV 91.8 91.6  MCH 31.1 31.0  MCHC 33.9 33.9  RDW 13.4 13.5  LYMPHSABS 1.6 0.9  MONOABS 0.6 0.5  EOSABS 0.0 0.0  BASOSABS 0.1 0.0    Recent Labs  Lab 11/07/23 2053 11/08/23 0455 11/08/23 0820 11/09/23 0501  NA 131*  --  136 137  K 4.2  --  5.0 4.4  CL 102  --  107 108  CO2 18*  --  21* 20*  ANIONGAP 11  --  8 9  GLUCOSE 155*  --  192* 153*  BUN 35*  --  29* 24*  CREATININE 2.15*  --  1.97* 1.81*  AST 18  --   --   --   ALT 25  --   --   --   ALKPHOS 96  --   --   --  BILITOT 0.8  --   --   --   ALBUMIN 3.5  --   --   --   INR 1.0  --   --   --   HGBA1C  --    < > 9.1*  --   MG  --   --  1.9 2.0  CALCIUM 8.8*  --  8.7* 8.8*   < > = values in this interval not displayed.      Recent Labs  Lab 11/07/23 2053 11/08/23 0455 11/08/23 0820 11/09/23 0501  INR 1.0  --   --   --   HGBA1C  --    < > 9.1*  --   MG  --   --  1.9 2.0  CALCIUM 8.8*  --  8.7* 8.8*   < > = values in this interval not displayed.    --------------------------------------------------------------------------------------------------------------- Lab Results  Component Value Date   CHOL 155 11/08/2023   HDL 26 (L) 11/08/2023   LDLCALC 100 (H) 11/08/2023   TRIG 145 11/08/2023   CHOLHDL 6.0 11/08/2023    Lab Results  Component Value Date   HGBA1C 9.1 (H) 11/08/2023    Radiology Report MR BRAIN WO CONTRAST Result Date: 11/08/2023 CLINICAL DATA:  Acute neurologic deficit EXAM: MRI HEAD WITHOUT CONTRAST TECHNIQUE: Multiplanar, multiecho pulse sequences of the brain and surrounding structures were obtained without intravenous contrast. COMPARISON:  None Available. FINDINGS: Brain: Multifocal abnormal diffusion restriction  within the left frontal lobe and left basal ganglia. No acute or chronic hemorrhage. Cytotoxic edema within the anterior left frontal lobe. No hydrocephalus. The midline structures are normal. Vascular: Normal flow voids. Skull and upper cervical spine: Normal calvarium and skull base. Visualized upper cervical spine and soft tissues are normal. Sinuses/Orbits:Mastoid effusions. Paranasal sinuses are clear. Normal orbits. IMPRESSION: Multifocal acute infarcts within the left frontal lobe and left basal ganglia, predominantly ACA territory. No hemorrhage. Electronically Signed   By: Deatra Robinson M.D.   On: 11/08/2023 19:49   VAS US CAROTID Result Date: 11/08/2023 Carotid Arterial Duplex Study Patient Name:  JANALYNN EDER [facility]  Date of Exam:   11/08/2023 Medical Rec #: [id number]         Accession #:    [id number] Date of Birth: 04/05/1966        Patient Gender: F Patient Age:   66 years Exam Location:  [facility] Procedure:      VAS US CAROTID Referring Phys: Graceann Congress --------------------------------------------------------------------------------  Indications:       Carotid artery disease Risk Factors:      Hypertension, hyperlipidemia, Diabetes, current smoker, prior                    CVA. Comparison Study:  No previous carotid duplex exams. CTA on 11/07/2023 showed                    critical stenosis of left ICA Performing Technologist: Ernestene Mention RVT, RDMS  Examination Guidelines: A complete evaluation includes B-mode imaging, spectral Doppler, color Doppler, and power Doppler as needed of all accessible portions of each vessel. Bilateral testing is considered an integral part of a complete examination. Limited examinations for reoccurring indications may be performed as noted.  Right Carotid Findings: +----------+--------+--------+--------+-------------------------------+--------+           PSV cm/sEDV cm/sStenosisPlaque Description             Comments  +----------+--------+--------+--------+-------------------------------+--------+ CCA Prox  117     21  heterogenous and calcific               +----------+--------+--------+--------+-------------------------------+--------+ CCA Distal107     25              calcific, hypoechoic and                                                  heterogenous                            +----------+--------+--------+--------+-------------------------------+--------+ ICA Prox  73      28                                                      +----------+--------+--------+--------+-------------------------------+--------+ ICA Distal53      18                                                      +----------+--------+--------+--------+-------------------------------+--------+ ECA       100     10                                                      +----------+--------+--------+--------+-------------------------------+--------+ +----------+--------+-------+----------------+-------------------+           PSV cm/sEDV cmsDescribe        Arm Pressure (mmHG) +----------+--------+-------+----------------+-------------------+ Subclavian101            Multiphasic, WNL                    +----------+--------+-------+----------------+-------------------+ +---------+--------+--+--------+--+---------+ VertebralPSV cm/s29EDV cm/s11Antegrade +---------+--------+--+--------+--+---------+  Left Carotid Findings: +----------+--------+--------+--------+------------------------+---------------+           PSV cm/sEDV cm/sStenosisPlaque Description      Comments        +----------+--------+--------+--------+------------------------+---------------+ CCA Prox  18      0               calcific and hyperechoic                +----------+--------+--------+--------+------------------------+---------------+ CCA Mid   23      0               calcific and                                                               heterogenous                            +----------+--------+--------+--------+------------------------+---------------+ CCA Distal25      5               calcific and  heterogenous                            +----------+--------+--------+--------+------------------------+---------------+ ICA Prox  277     142     80-99%                                          +----------+--------+--------+--------+------------------------+---------------+ ICA Mid   46      18                                                      +----------+--------+--------+--------+------------------------+---------------+ ICA Distal44      22                                                      +----------+--------+--------+--------+------------------------+---------------+ ECA       68      36              calcific                retrograde flow +----------+--------+--------+--------+------------------------+---------------+ +----------+--------+--------+--------+-------------------+           PSV cm/sEDV cm/sDescribeArm Pressure (mmHG) +----------+--------+--------+--------+-------------------+ [id number]             Stenotic                    +----------+--------+--------+--------+-------------------+ +---------+--------+--+--------+--+---------+ VertebralPSV cm/s58EDV cm/s16Antegrade +---------+--------+--+--------+--+---------+ ECA flows into bulb then into ICA.  Summary: Right Carotid: Velocities in the right ICA are consistent with a 1-39% stenosis.                Non-hemodynamically significant plaque <50% noted in the CCA. The                ECA appears <50% stenosed. Left Carotid: Velocities in the left ICA are consistent with a 80-99% stenosis.               Occlusion in left carotid bulb. ECA flows retrograde into bulb,               providing flow to ICA.  Bidirectional flow seen in proximal ICA.  *See table(s) above for measurements and observations.     Preliminary    ECHOCARDIOGRAM COMPLETE BUBBLE STUDY Result Date: 11/08/2023    ECHOCARDIOGRAM REPORT   Patient Name:   GRAZIELLA CONNERY [facility] Date of Exam: 11/08/2023 Medical Rec #:  [id number]        Height:       66.0 in Accession #:    [id number]       Weight:       251.8 lb Date of Birth:  11/17/1965       BSA:          2.205 m Patient Age:    57 years         BP:           160/80 mmHg Patient Gender: F                HR:  76 bpm. Exam Location:  Inpatient Procedure: 2D Echo, Cardiac Doppler, Color Doppler and Saline Contrast Bubble            Study (Both Spectral and Color Flow Doppler were utilized during            procedure). Indications:    Stroke  History:        Patient has no prior history of Echocardiogram examinations.                 Risk Factors:Hypertension, Diabetes, Dyslipidemia and Current                 Smoker.  Sonographer:    Karma Ganja Referring Phys: [id number] JAN A MANSY  Sonographer Comments: Technically challenging study due to limited acoustic windows and patient is obese. Image acquisition challenging due to uncooperative patient. IMPRESSIONS  1. Left ventricular ejection fraction, by estimation, is 55 to 60%. The left ventricle has normal function. The left ventricle has no regional wall motion abnormalities. There is moderate concentric left ventricular hypertrophy. Left ventricular diastolic parameters are consistent with Grade I diastolic dysfunction (impaired relaxation). Elevated left atrial pressure.  2. Right ventricular systolic function is normal. The right ventricular size is normal. Tricuspid regurgitation signal is inadequate for assessing PA pressure.  3. Left atrial size was moderately dilated.  4. The mitral valve is degenerative. No evidence of mitral valve regurgitation. Mild mitral stenosis. The mean mitral valve gradient is 4.0 mmHg with average heart rate of 82  bpm. Moderate to severe mitral annular calcification.  5. The aortic valve was not well visualized. Aortic valve regurgitation is not visualized. No aortic stenosis is present.  6. The inferior vena cava is normal in size with greater than 50% respiratory variability, suggesting right atrial pressure of 3 mmHg.  7. Agitated saline contrast bubble study was negative, with no evidence of any interatrial shunt. FINDINGS  Left Ventricle: Left ventricular ejection fraction, by estimation, is 55 to 60%. The left ventricle has normal function. The left ventricle has no regional wall motion abnormalities. The left ventricular internal cavity size was normal in size. There is  moderate concentric left ventricular hypertrophy. Left ventricular diastolic parameters are consistent with Grade I diastolic dysfunction (impaired relaxation). Elevated left atrial pressure. Right Ventricle: The right ventricular size is normal. Right vetricular wall thickness was not well visualized. Right ventricular systolic function is normal. Tricuspid regurgitation signal is inadequate for assessing PA pressure. Left Atrium: Left atrial size was moderately dilated. Right Atrium: Right atrial size was normal in size. Pericardium: There is no evidence of pericardial effusion. Mitral Valve: The mitral valve is degenerative in appearance. Moderate to severe mitral annular calcification. No evidence of mitral valve regurgitation. Mild mitral valve stenosis. MV peak gradient, 9.0 mmHg. The mean mitral valve gradient is 4.0 mmHg with average heart rate of 82 bpm. Tricuspid Valve: The tricuspid valve is not well visualized. Aortic Valve: The aortic valve was not well visualized. Aortic valve regurgitation is not visualized. No aortic stenosis is present. Aortic valve mean gradient measures 3.0 mmHg. Aortic valve peak gradient measures 7.0 mmHg. Aortic valve area, by VTI measures 2.38 cm. Pulmonic Valve: The pulmonic valve was not well visualized.  Aorta: The aortic root is normal in size and structure. Venous: The inferior vena cava is normal in size with greater than 50% respiratory variability, suggesting right atrial pressure of 3 mmHg. IAS/Shunts: No atrial level shunt detected by color flow Doppler. Agitated saline contrast was given intravenously  to evaluate for intracardiac shunting. Agitated saline contrast bubble study was negative, with no evidence of any interatrial shunt.  LEFT VENTRICLE PLAX 2D LVIDd:         4.60 cm   Diastology LVIDs:         3.40 cm   LV e' medial:    3.70 cm/s LV PW:         1.30 cm   LV E/e' medial:  20.9 LV IVS:        1.50 cm   LV e' lateral:   3.92 cm/s LVOT diam:     2.00 cm   LV E/e' lateral: 19.7 LV SV:         52 LV SV Index:   24 LVOT Area:     3.14 cm  RIGHT VENTRICLE             IVC RV Basal diam:  3.10 cm     IVC diam: 0.90 cm RV S prime:     17.30 cm/s LEFT ATRIUM             Index        RIGHT ATRIUM           Index LA diam:        4.50 cm 2.04 cm/m   RA Area:     13.80 cm LA Vol (A2C):   61.5 ml 27.89 ml/m  RA Volume:   30.00 ml  13.60 ml/m LA Vol (A4C):   83.0 ml 37.64 ml/m LA Biplane Vol: 73.8 ml 33.46 ml/m  AORTIC VALVE AV Area (Vmax):    1.95 cm AV Area (Vmean):   2.15 cm AV Area (VTI):     2.38 cm AV Vmax:           132.00 cm/s AV Vmean:          77.900 cm/s AV VTI:            0.220 m AV Peak Grad:      7.0 mmHg AV Mean Grad:      3.0 mmHg LVOT Vmax:         81.90 cm/s LVOT Vmean:        53.300 cm/s LVOT VTI:          0.167 m LVOT/AV VTI ratio: 0.76  AORTA Ao Root diam: 3.20 cm Ao Asc diam:  2.90 cm MITRAL VALVE MV Area (PHT): 4.36 cm     SHUNTS MV Area VTI:   1.59 cm     Systemic VTI:  0.17 m MV Peak grad:  9.0 mmHg     Systemic Diam: 2.00 cm MV Mean grad:  4.0 mmHg MV Vmax:       1.50 m/s MV Vmean:      91.6 cm/s MV Decel Time: 174 msec MV E velocity: 77.30 cm/s MV A velocity: 142.00 cm/s MV E/A ratio:  0.54 Mihai Croitoru MD Electronically signed by Thurmon Fair MD Signature Date/Time:  11/08/2023/3:28:58 PM    Final    CT ANGIO HEAD NECK W WO CM Result Date: 11/07/2023 CLINICAL DATA:  Stroke/TIA, determine embolic source EXAM: CT ANGIOGRAPHY HEAD AND NECK WITH AND WITHOUT CONTRAST TECHNIQUE: Multidetector CT imaging of the head and neck was performed using the standard protocol during bolus administration of intravenous contrast. Multiplanar CT image reconstructions and MIPs were obtained to evaluate the vascular anatomy. Carotid stenosis measurements (when applicable) are obtained utilizing NASCET criteria, using the distal internal carotid diameter as the denominator. RADIATION  DOSE REDUCTION: This exam was performed according to the departmental dose-optimization program which includes automated exposure control, adjustment of the mA and/or kV according to patient size and/or use of iterative reconstruction technique. CONTRAST:  60mL OMNIPAQUE IOHEXOL 350 MG/ML SOLN COMPARISON:  CT head from earlier today. FINDINGS: CTA NECK FINDINGS Aortic arch: Aortic atherosclerosis. Great vessel origins are patent. Motion limits evaluation for stenosis with probable moderate stenosis of the left proximal subclavian artery. Right carotid system: Atherosclerosis at the carotid bifurcation without greater than 50% stenosis. Left carotid system: Extensive atherosclerosis of the common carotid artery with multifocal moderate to severe stenosis. Critically stenotic and nearly occlusive left ICA origin with only a tiny stream of contrast visible. More distal ICA in the neck is small, but remains patent. Vertebral arteries: Right dominant. Severe stenosis of the right vertebral artery origin. Left vertebral artery is patent without definite high-grade stenosis although evaluation at the origin is limited due to motion. Skeleton: No acute abnormality on limited assessment. Other neck: No acute abnormality on limited assessment. Upper chest: Visualized lung apices are clear. Review of the MIP images confirms the  above findings CTA HEAD FINDINGS Anterior circulation: Small left ICA. Severe bilateral paraclinoid ICA stenosis. Bilateral MCAs are patent without proximal high-grade stenosis. 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 Right A1 ACA and bilateral A2 ACAs are patent proximally. Limited distal evaluation due to motion. Posterior circulation: Bilateral intradural vertebral arteries, basilar artery and bilateral posterior cerebral arteries are patent without proximal high-grade stenosis. Atherosclerotic irregularity of the PCAs bilaterally. Venous sinuses: As permitted by contrast timing, patent. Review of the MIP images confirms the above findings IMPRESSION: 1. Critically stenotic and nearly occlusive left ICA origin with only a tiny stream of contrast visible. More distal ICA is small, but remains patent. 2. Severe bilateral paraclinoid ICA stenosis. 3. Severe right vertebral artery origin stenosis. 4. Extensive atherosclerosis of the left common carotid artery with multifocal moderate to severe stenosis. 5. Probable moderate stenosis of the left subclavian artery, although motion limits assessment. 6.  Aortic Atherosclerosis (ICD10-I70.0). Findings discussed with Dr. Estell Harpin via telephone at 11:04 PM. Electronically Signed   By: Feliberto Harts M.D.   On: 11/07/2023 23:06   CT Head Wo Contrast Result Date: 11/07/2023 CLINICAL DATA:  Altered mental status, aphasia, memory loss, slurred speech EXAM: CT HEAD WITHOUT CONTRAST TECHNIQUE: Contiguous axial images were obtained from the base of the skull through the vertex without intravenous contrast. RADIATION DOSE REDUCTION: This exam was performed according to the departmental dose-optimization program which includes automated exposure control, adjustment of the mA and/or kV according to patient size and/or use of iterative reconstruction technique. COMPARISON:  None Available. FINDINGS: Brain: Hypoattenuation within the superior left frontal lobe  near the vertex (circa series 2/image 25 and 4/31) concerning for acute or subacute infarcts. Additional foci of hypoattenuation in the left basal ganglia (2/17 and 2/15). Vascular: No hyperdense vessel or unexpected calcification. Skull: Normal. Negative for fracture or focal lesion. Sinuses/Orbits: Moderate opacification of the mastoid air cells bilaterally. The paranasal sinuses are well aerated. Other: None. IMPRESSION: Multiple foci of hypoattenuation and left frontal lobe and basal ganglia concerning for acute or subacute infarcts. Critical Value/emergent results were called by telephone at the time of interpretation on 11/07/2023 at 9:35 pm to provider [facility] ZAMMIT , who verbally acknowledged these results. Electronically Signed   By: Minerva Fester M.D.   On: 11/07/2023 21:38     Signature  -   Susa Raring M.D on 11/09/2023  at 7:36 AM   -  To page go to [email or web address]

## 2023-11-09 NOTE — Progress Notes (Addendum)
   11/09/23 1347  Mobility  Activity Ambulated independently in hallway  Level of Assistance Standby assist, set-up cues, supervision of patient - no hands on  Assistive Device None  Distance Ambulated (ft) 200 ft  Activity Response Tolerated well  Mobility Referral Yes  Mobility visit 1 Mobility  Mobility Specialist Start Time (ACUTE ONLY) 1340  Mobility Specialist Stop Time (ACUTE ONLY) 1347  Mobility Specialist Time Calculation (min) (ACUTE ONLY) 7 min   Mobility Specialist: Progress Note- Visits:2  Pre-Mobility:      HR 73, SpO2 95% RA. During Mobility: HR 93, SpO2 92% RA Post-Mobility:    HR 80, SpO2 97% RA  Pt agreeable to mobility session - received in bed. C/o difficulty formulating words/ sentences . Returned to bed with all needs met - call bell within reach. Family Present.   Pre-Mobility:      HR 80, SpO2 93% RA Post-Mobility:    HR 81, SpO2 98% RA  Pt agreeable to mobility session - received in bed.Pt was asymptomatic throughout session with no complaints. Returned to bed with all needs met - call bell within reach. Family Present.   Barnie Mort, BS Mobility Specialist Please contact via SecureChat or  Rehab office at [phone].

## 2023-11-09 NOTE — Telephone Encounter (Signed)
 Spoke with pt and her son to provide pre op instructions for upcoming 4/7 surgery.

## 2023-11-09 NOTE — Progress Notes (Signed)
 Nonsustained V. tach - RN reported that patient has 11 runs of nonsustained V. tach.  Blood pressure stable 155/100. Checking BMP and mag to make sure he is close normal range - Currently patient is on permissive hypertension in the setting of CVA which limiting the use of Lopressor 12.5 mg twice daily. -Starting Lopressor 2.5 mg 5 need as needed for persistent heart rate above 120 and if patient develop sustained V. tach will give amiodarone bolus.  Tereasa Coop, MD [facility] 11/09/2023, 1:17 AM

## 2023-11-09 NOTE — Progress Notes (Addendum)
 STROKE TEAM PROGRESS NOTE   INTERIM HISTORY/SUBJECTIVE VVS consulted and plan carotid angio tomorrow with tentative plans for endarterectomy on 4/7  She is still having some hesitancy with her speech but no focal weakness.  OBJECTIVE  CBC    Component Value Date/Time   WBC 7.4 11/08/2023 0820   RBC 4.87 11/08/2023 0820   HGB 15.1 (H) 11/08/2023 0820   HGB 15.8 03/05/2016 1127   HCT 44.6 11/08/2023 0820   HCT 45.7 03/05/2016 1127   PLT 158 11/08/2023 0820   PLT 173 03/05/2016 1127   MCV 91.6 11/08/2023 0820   MCV 95 03/05/2016 1127   MCH 31.0 11/08/2023 0820   MCHC 33.9 11/08/2023 0820   RDW 13.5 11/08/2023 0820   RDW 13.5 03/05/2016 1127   LYMPHSABS 0.9 11/08/2023 0820   MONOABS 0.5 11/08/2023 0820   EOSABS 0.0 11/08/2023 0820   BASOSABS 0.0 11/08/2023 0820    BMET    Component Value Date/Time   NA 137 11/09/2023 0501   NA 139 04/15/2017 1007   K 4.4 11/09/2023 0501   CL 108 11/09/2023 0501   CO2 20 (L) 11/09/2023 0501   GLUCOSE 153 (H) 11/09/2023 0501   BUN 24 (H) 11/09/2023 0501   BUN 16 04/15/2017 1007   CREATININE 1.81 (H) 11/09/2023 0501   CREATININE 1.29 (H) 05/27/2017 0831   CALCIUM 8.8 (L) 11/09/2023 0501   GFRNONAA 32 (L) 11/09/2023 0501   GFRNONAA 48 (L) 05/27/2017 0831    IMAGING past 24 hours MR BRAIN WO CONTRAST Result Date: 11/08/2023 CLINICAL DATA:  Acute neurologic deficit EXAM: MRI HEAD WITHOUT CONTRAST TECHNIQUE: Multiplanar, multiecho pulse sequences of the brain and surrounding structures were obtained without intravenous contrast. COMPARISON:  None Available. FINDINGS: Brain: Multifocal abnormal diffusion restriction within the left frontal lobe and left basal ganglia. No acute or chronic hemorrhage. Cytotoxic edema within the anterior left frontal lobe. No hydrocephalus. The midline structures are normal. Vascular: Normal flow voids. Skull and upper cervical spine: Normal calvarium and skull base. Visualized upper cervical spine and soft  tissues are normal. Sinuses/Orbits:Mastoid effusions. Paranasal sinuses are clear. Normal orbits. IMPRESSION: Multifocal acute infarcts within the left frontal lobe and left basal ganglia, predominantly ACA territory. No hemorrhage. Electronically Signed   By: Deatra Robinson M.D.   On: 11/08/2023 19:49   VAS US CAROTID Result Date: 11/08/2023 Carotid Arterial Duplex Study Patient Name:  Erica Graham [facility]  Date of Exam:   11/08/2023 Medical Rec #: [id number]         Accession #:    [id number] Date of Birth: 1966-02-22        Patient Gender: F Patient Age:   58 years Exam Location:  [facility] Procedure:      VAS US CAROTID Referring Phys: Graceann Congress --------------------------------------------------------------------------------  Indications:       Carotid artery disease Risk Factors:      Hypertension, hyperlipidemia, Diabetes, current smoker, prior                    CVA. Comparison Study:  No previous carotid duplex exams. CTA on 11/07/2023 showed                    critical stenosis of left ICA Performing Technologist: Ernestene Mention RVT, RDMS  Examination Guidelines: A complete evaluation includes B-mode imaging, spectral Doppler, color Doppler, and power Doppler as needed of all accessible portions of each vessel. Bilateral testing is considered an integral part of a complete examination.  Limited examinations for reoccurring indications may be performed as noted.  Right Carotid Findings: +----------+--------+--------+--------+-------------------------------+--------+           PSV cm/sEDV cm/sStenosisPlaque Description             Comments +----------+--------+--------+--------+-------------------------------+--------+ CCA Prox  117     21              heterogenous and calcific               +----------+--------+--------+--------+-------------------------------+--------+ CCA Distal107     25              calcific, hypoechoic and                                                   heterogenous                            +----------+--------+--------+--------+-------------------------------+--------+ ICA Prox  73      28                                                      +----------+--------+--------+--------+-------------------------------+--------+ ICA Distal53      18                                                      +----------+--------+--------+--------+-------------------------------+--------+ ECA       100     10                                                      +----------+--------+--------+--------+-------------------------------+--------+ +----------+--------+-------+----------------+-------------------+           PSV cm/sEDV cmsDescribe        Arm Pressure (mmHG) +----------+--------+-------+----------------+-------------------+ Subclavian101            Multiphasic, WNL                    +----------+--------+-------+----------------+-------------------+ +---------+--------+--+--------+--+---------+ VertebralPSV cm/s29EDV cm/s11Antegrade +---------+--------+--+--------+--+---------+  Left Carotid Findings: +----------+--------+--------+--------+------------------------+---------------+           PSV cm/sEDV cm/sStenosisPlaque Description      Comments        +----------+--------+--------+--------+------------------------+---------------+ CCA Prox  18      0               calcific and hyperechoic                +----------+--------+--------+--------+------------------------+---------------+ CCA Mid   23      0               calcific and  heterogenous                            +----------+--------+--------+--------+------------------------+---------------+ CCA Distal25      5               calcific and                                                              heterogenous                             +----------+--------+--------+--------+------------------------+---------------+ ICA Prox  277     142     80-99%                                          +----------+--------+--------+--------+------------------------+---------------+ ICA Mid   46      18                                                      +----------+--------+--------+--------+------------------------+---------------+ ICA Distal44      22                                                      +----------+--------+--------+--------+------------------------+---------------+ ECA       68      36              calcific                retrograde flow +----------+--------+--------+--------+------------------------+---------------+ +----------+--------+--------+--------+-------------------+           PSV cm/sEDV cm/sDescribeArm Pressure (mmHG) +----------+--------+--------+--------+-------------------+ [id number]             Stenotic                    +----------+--------+--------+--------+-------------------+ +---------+--------+--+--------+--+---------+ VertebralPSV cm/s58EDV cm/s16Antegrade +---------+--------+--+--------+--+---------+ ECA flows into bulb then into ICA.  Summary: Right Carotid: Velocities in the right ICA are consistent with a 1-39% stenosis.                Non-hemodynamically significant plaque <50% noted in the CCA. The                ECA appears <50% stenosed. Left Carotid: Velocities in the left ICA are consistent with a 80-99% stenosis.               Occlusion in left carotid bulb. ECA flows retrograde into bulb,               providing flow to ICA. Bidirectional flow seen in proximal ICA.  *See table(s) above for measurements and observations.     Preliminary    ECHOCARDIOGRAM COMPLETE BUBBLE STUDY Result Date: 11/08/2023    ECHOCARDIOGRAM REPORT   Patient Name:   [facility] Date of Exam: 11/08/2023 Medical Rec #:  [id number]        Height:       66.0 in Accession #:     [id number]       Weight:       251.8 lb Date of Birth:  12/11/1965       BSA:          2.205 m Patient Age:    57 years         BP:           160/80 mmHg Patient Gender: F                HR:           76 bpm. Exam Location:  Inpatient Procedure: 2D Echo, Cardiac Doppler, Color Doppler and Saline Contrast Bubble            Study (Both Spectral and Color Flow Doppler were utilized during            procedure). Indications:    Stroke  History:        Patient has no prior history of Echocardiogram examinations.                 Risk Factors:Hypertension, Diabetes, Dyslipidemia and Current                 Smoker.  Sonographer:    Karma Ganja Referring Phys: [id number] JAN A MANSY  Sonographer Comments: Technically challenging study due to limited acoustic windows and patient is obese. Image acquisition challenging due to uncooperative patient. IMPRESSIONS  1. Left ventricular ejection fraction, by estimation, is 55 to 60%. The left ventricle has normal function. The left ventricle has no regional wall motion abnormalities. There is moderate concentric left ventricular hypertrophy. Left ventricular diastolic parameters are consistent with Grade I diastolic dysfunction (impaired relaxation). Elevated left atrial pressure.  2. Right ventricular systolic function is normal. The right ventricular size is normal. Tricuspid regurgitation signal is inadequate for assessing PA pressure.  3. Left atrial size was moderately dilated.  4. The mitral valve is degenerative. No evidence of mitral valve regurgitation. Mild mitral stenosis. The mean mitral valve gradient is 4.0 mmHg with average heart rate of 82 bpm. Moderate to severe mitral annular calcification.  5. The aortic valve was not well visualized. Aortic valve regurgitation is not visualized. No aortic stenosis is present.  6. The inferior vena cava is normal in size with greater than 50% respiratory variability, suggesting right atrial pressure of 3 mmHg.  7. Agitated saline  contrast bubble study was negative, with no evidence of any interatrial shunt. FINDINGS  Left Ventricle: Left ventricular ejection fraction, by estimation, is 55 to 60%. The left ventricle has normal function. The left ventricle has no regional wall motion abnormalities. The left ventricular internal cavity size was normal in size. There is  moderate concentric left ventricular hypertrophy. Left ventricular diastolic parameters are consistent with Grade I diastolic dysfunction (impaired relaxation). Elevated left atrial pressure. Right Ventricle: The right ventricular size is normal. Right vetricular wall thickness was not well visualized. Right ventricular systolic function is normal. Tricuspid regurgitation signal is inadequate for assessing PA pressure. Left Atrium: Left atrial size was moderately dilated. Right Atrium: Right atrial size was normal in size. Pericardium: There is no evidence of pericardial effusion. Mitral Valve: The mitral valve is degenerative in appearance. Moderate to severe mitral annular calcification. No evidence of mitral valve regurgitation. Mild mitral valve stenosis. MV peak gradient, 9.0 mmHg. The mean mitral valve gradient  is 4.0 mmHg with average heart rate of 82 bpm. Tricuspid Valve: The tricuspid valve is not well visualized. Aortic Valve: The aortic valve was not well visualized. Aortic valve regurgitation is not visualized. No aortic stenosis is present. Aortic valve mean gradient measures 3.0 mmHg. Aortic valve peak gradient measures 7.0 mmHg. Aortic valve area, by VTI measures 2.38 cm. Pulmonic Valve: The pulmonic valve was not well visualized. Aorta: The aortic root is normal in size and structure. Venous: The inferior vena cava is normal in size with greater than 50% respiratory variability, suggesting right atrial pressure of 3 mmHg. IAS/Shunts: No atrial level shunt detected by color flow Doppler. Agitated saline contrast was given intravenously to evaluate for  intracardiac shunting. Agitated saline contrast bubble study was negative, with no evidence of any interatrial shunt.  LEFT VENTRICLE PLAX 2D LVIDd:         4.60 cm   Diastology LVIDs:         3.40 cm   LV e' medial:    3.70 cm/s LV PW:         1.30 cm   LV E/e' medial:  20.9 LV IVS:        1.50 cm   LV e' lateral:   3.92 cm/s LVOT diam:     2.00 cm   LV E/e' lateral: 19.7 LV SV:         52 LV SV Index:   24 LVOT Area:     3.14 cm  RIGHT VENTRICLE             IVC RV Basal diam:  3.10 cm     IVC diam: 0.90 cm RV S prime:     17.30 cm/s LEFT ATRIUM             Index        RIGHT ATRIUM           Index LA diam:        4.50 cm 2.04 cm/m   RA Area:     13.80 cm LA Vol (A2C):   61.5 ml 27.89 ml/m  RA Volume:   30.00 ml  13.60 ml/m LA Vol (A4C):   83.0 ml 37.64 ml/m LA Biplane Vol: 73.8 ml 33.46 ml/m  AORTIC VALVE AV Area (Vmax):    1.95 cm AV Area (Vmean):   2.15 cm AV Area (VTI):     2.38 cm AV Vmax:           132.00 cm/s AV Vmean:          77.900 cm/s AV VTI:            0.220 m AV Peak Grad:      7.0 mmHg AV Mean Grad:      3.0 mmHg LVOT Vmax:         81.90 cm/s LVOT Vmean:        53.300 cm/s LVOT VTI:          0.167 m LVOT/AV VTI ratio: 0.76  AORTA Ao Root diam: 3.20 cm Ao Asc diam:  2.90 cm MITRAL VALVE MV Area (PHT): 4.36 cm     SHUNTS MV Area VTI:   1.59 cm     Systemic VTI:  0.17 m MV Peak grad:  9.0 mmHg     Systemic Diam: 2.00 cm MV Mean grad:  4.0 mmHg MV Vmax:       1.50 m/s MV Vmean:      91.6 cm/s MV Decel Time:  174 msec MV E velocity: 77.30 cm/s MV A velocity: 142.00 cm/s MV E/A ratio:  0.54 Mihai Croitoru MD Electronically signed by Thurmon Fair MD Signature Date/Time: 11/08/2023/3:28:58 PM    Final     Vitals:   11/08/23 2323 11/09/23 0103 11/09/23 0423 11/09/23 0819  BP: (!) 165/83 (!) 155/100 (!) 181/94 (!) 161/84  Pulse: 79 79 80 74  Resp: 18 13 20 18   Temp: 98.2 F (36.8 C)  98.6 F (37 C) 97.8 F (36.6 C)  TempSrc: Oral  Oral Oral  SpO2: 98% 96% 94% 92%  Weight:       Height:         PHYSICAL EXAM General:  Alert, well-nourished, well-developed patient in no acute distress Psych:  Mood and affect appropriate for situation CV: Regular rate and rhythm on monitor Respiratory:  Regular, unlabored respirations on room air GI: Abdomen soft and nontender   NEURO:  Mental Status: AA&Ox3, patient is able to give clear and coherent history Speech/Language: mild hesitancy of speech with intermittent word finding difficulties, improved.  Repetition and naming intact.   Cranial Nerves:  II: PERRL. Visual fields full.  III, IV, VI: EOMI. Eyelids elevate symmetrically.  V: Sensation is intact to light touch and symmetrical to face.  VII: Face is symmetrical resting and smiling VIII: hearing intact to voice. IX, X: Palate elevates symmetrically. Phonation is normal.  RU:[id number] shrug 5/5. XII: tongue is midline without fasciculations. Motor: 5/5 strength to all muscle groups tested.  Tone: is normal and bulk is normal Sensation- Intact to light touch bilaterally. Extinction absent to light touch to DSS.   Coordination: FTN intact bilaterally, HKS: no ataxia in BLE.No drift.  Gait- deferred  ASSESSMENT/PLAN  Erica Graham is a 58 y.o. female with history of prior stroke, depression, diabetes, GERD, hyperlipidemia, hypertension, obesity, daily smoker who presents with aphasia and mild right-sided weakness.   NIH on Admission 1  Stroke:  left frontal and BG infarct, etiology: Likely large vessel disease from left ICA bulb and siphon severe stenosis Code Stroke CT head Multiple foci of hypoattenuation and left frontal lobe basal ganglia    CTA head & neck Critically stenotic and nearly occlusive left ICA origin with only a tiny stream of contrast visible. More distal ICA is small, but remains patent. Severe bilateral paraclinoid ICA stenosis. Severe right vertebral artery origin stenosis. Extensive  atherosclerosis of the left common carotid artery with  multifocal moderate to severe stenosis. MRI  Multifocal acute infarcts within the left frontal lobe and left basal ganglia, predominantly ACA territory. No hemorrhage. 2D Echo EF 55 to 60%, no PFO Carotid US concerning for left ICA occlusion left ACA retrograde flow Carotid angiography planned for tomorrow LDL 100 HgbA1c 9.1 UDS negative VTE prophylaxis - Lovenox  No antithrombotic prior to admission, now on aspirin 81 mg daily and clopidogrel 75 mg daily for 3 months and then as alone given severe stenosis of ICA bulb and siphon. Therapy recommendations: None Disposition:  Pending   Carotid artery stenosis  CTA head & neck Critically stenotic and nearly occlusive left ICA origin with only a tiny stream of contrast visible. Severe bilateral paraclinoid ICA stenosis. Extensive  atherosclerosis of the left common carotid artery with multifocal moderate to severe stenosis.  Right carotid bulb atherosclerosis Vascular surgery consulted Carotid US concerning for left ICA occlusion left ACA retrograde flow Carotid angiography planned for tomorrow On DAPT now  Hypertension Home meds:  Cozaar Stable on the high and BP  goal 140-160 prior to procedure Long-term BP goal 130-150 given bilateral ICA siphon severe stenosis  Hyperlipidemia Home meds: None LDL 100, goal < 70 Now on Crestor 20 Continue statin at discharge  Diabetes type II Uncontrolled Home meds:  None HgbA1c 9.1, goal < 7.0 CBGs SSI Recommend close follow-up with PCP for better DM control  Tobacco Abuse Smokes 20 packs per year       Ready to quit? Yes Nicotine replacement therapy provided  Other Stroke Risk Factors Obesity, Body mass index is 40.64 kg/m., BMI >/= 30 associated with increased stroke risk, recommend weight loss, diet and exercise as appropriate   Other acute issues AKI on CKD 3B, creatinine 2.15-> 1.97->1.81  Hospital day # 0  Patient seen and examined by NP/APP with MD. MD to update note as  needed.   Elmer Picker, DNP, FNP-BC [facility] Pager: [phone]  ATTENDING NOTE: I reviewed above note and agree with the assessment and plan. Pt was seen and examined.   Patient mom and extremities are at bedside.  She is lying in bed, aphasia improved, only has slight hesitancy and word finding difficulty.  Able to name repeat.  Moving all extremities.  Carotid Doppler yesterday showed possible left ICA occlusion with ECA retrograde flow.  Vascular surgery plan for carotid angiogram tomorrow to decide on procedure.  Continue DAPT and statin.  Will follow  For detailed assessment and plan, please refer to above as I have made changes wherever appropriate.   Marvel Plan, MD PhD Stroke Neurology 11/09/2023 4:12 PM    To contact Stroke Continuity provider, please refer to [email or web address]. After hours, contact General Neurology

## 2023-11-09 NOTE — Progress Notes (Signed)
 [facility]  Subjective  -no neurologic events overnight   Objective (!) 181/94 80 98.6 F (37 C) (Oral) 20 94%  Intake/Output Summary (Last 24 hours) at 11/09/2023 0638 Last data filed at 11/09/2023 0300 Gross per 24 hour  Intake 623.33 ml  Output --  Net 623.33 ml    Grossly neurologically intact Right femoral pulse palpable  Laboratory Lab Results: Recent Labs    11/07/23 2053 11/08/23 0820  WBC 9.1 7.4  HGB 15.9* 15.1*  HCT 46.9* 44.6  PLT 179 158   BMET Recent Labs    11/08/23 0820 11/09/23 0501  NA 136 137  K 5.0 4.4  CL 107 108  CO2 21* 20*  GLUCOSE 192* 153*  BUN 29* 24*  CREATININE 1.97* 1.81*  CALCIUM 8.7* 8.8*    COAG Lab Results  Component Value Date   INR 1.0 11/07/2023   No results found for: "PTT"  Assessment/Planning:   58 year old female with hypertension, hyperlipidemia, diabetes that vascular surgery has been consulted for symptomatic left ICA stenosis.  Presents here as a transfer from [facility].  States she had slurred speech on Saturday.  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  CTA neck reviewed with high-grade critical stenosis of the left ICA with severely diseased common carotid artery more proximal with extensive atherosclerosis.   Carotid duplex concerning for occlusion of the left carotid bulb with retrograde flow in the ECA filling the internal carotid artery.  Discussed with the patient the discrepancy between her CTA and ultrasound.  I do believe there is a flow channel on the CTA neck.  I have recommended a carotid angiogram tomorrow in the Cath Lab to confirm that carotid revascularization is still indicated.  N.p.o. after midnight.  Consent order placed.  Did discuss stroke risk with the procedure.   Cephus Shelling 11/09/2023 6:38 AM --

## 2023-11-10 ENCOUNTER — Telehealth (HOSPITAL_COMMUNITY): Payer: Self-pay | Admitting: Pharmacy Technician

## 2023-11-10 ENCOUNTER — Encounter (HOSPITAL_COMMUNITY)
Admission: EM | Disposition: A | Discharge status: Home / Self Care | Payer: Self-pay | Source: Other Acute Inpatient Hospital | Attending: Internal Medicine

## 2023-11-10 ENCOUNTER — Other Ambulatory Visit (HOSPITAL_COMMUNITY): Payer: Self-pay

## 2023-11-10 DIAGNOSIS — I6522 Occlusion and stenosis of left carotid artery: Secondary | ICD-10-CM | POA: Diagnosis not present

## 2023-11-10 DIAGNOSIS — E785 Hyperlipidemia, unspecified: Secondary | ICD-10-CM | POA: Diagnosis not present

## 2023-11-10 DIAGNOSIS — I639 Cerebral infarction, unspecified: Secondary | ICD-10-CM | POA: Diagnosis not present

## 2023-11-10 DIAGNOSIS — Z7902 Long term (current) use of antithrombotics/antiplatelets: Secondary | ICD-10-CM | POA: Diagnosis not present

## 2023-11-10 DIAGNOSIS — I63232 Cerebral infarction due to unspecified occlusion or stenosis of left carotid arteries: Secondary | ICD-10-CM | POA: Diagnosis not present

## 2023-11-10 DIAGNOSIS — F17210 Nicotine dependence, cigarettes, uncomplicated: Secondary | ICD-10-CM | POA: Diagnosis not present

## 2023-11-10 HISTORY — PX: CAROTID ANGIOGRAPHY: CATH118230

## 2023-11-10 LAB — BASIC METABOLIC PANEL WITH GFR
Anion gap: 8 (ref 5–15)
BUN: 26 mg/dL — ABNORMAL HIGH (ref 6–20)
CO2: 19 mmol/L — ABNORMAL LOW (ref 22–32)
Calcium: 8.5 mg/dL — ABNORMAL LOW (ref 8.9–10.3)
Chloride: 107 mmol/L (ref 98–111)
Creatinine, Ser: 1.9 mg/dL — ABNORMAL HIGH (ref 0.44–1.00)
GFR, Estimated: 30 mL/min — ABNORMAL LOW (ref >60)
Glucose, Bld: 179 mg/dL — ABNORMAL HIGH (ref 70–99)
Potassium: 4.5 mmol/L (ref 3.5–5.1)
Sodium: 134 mmol/L — ABNORMAL LOW (ref 135–145)

## 2023-11-10 LAB — CBC
HCT: 44.4 % (ref 36.0–46.0)
Hemoglobin: 14.9 g/dL (ref 12.0–15.0)
MCH: 30.9 pg (ref 26.0–34.0)
MCHC: 33.6 g/dL (ref 30.0–36.0)
MCV: 92.1 fL (ref 80.0–100.0)
Platelets: 166 10*3/uL (ref 150–400)
RBC: 4.82 MIL/uL (ref 3.87–5.11)
RDW: 13.2 % (ref 11.5–15.5)
WBC: 7.7 10*3/uL (ref 4.0–10.5)
nRBC: 0 % (ref 0.0–0.2)

## 2023-11-10 LAB — GLUCOSE, CAPILLARY
Glucose-Capillary: 185 mg/dL — ABNORMAL HIGH (ref 70–99)
Glucose-Capillary: 129 mg/dL — ABNORMAL HIGH (ref 70–99)

## 2023-11-10 MED ORDER — NICOTINE 21 MG/24HR TD PT24: 21.0000 mg | MEDICATED_PATCH | Freq: Every day | TRANSDERMAL | Status: DC

## 2023-11-10 MED ORDER — CLOPIDOGREL BISULFATE 75 MG PO TABS
75.0000 mg | ORAL_TABLET | Freq: Every day | ORAL | 0 refills | Status: DC
  Filled 2023-11-10: qty 30, 30d supply, fill #0

## 2023-11-10 MED ORDER — ACETAMINOPHEN 325 MG PO TABS: 650.0000 mg | ORAL_TABLET | ORAL | Status: DC | PRN

## 2023-11-10 MED ORDER — FENTANYL CITRATE (PF) 100 MCG/2ML IJ SOLN
INTRAMUSCULAR | Status: AC
  Filled 2023-11-10: qty 2

## 2023-11-10 MED ORDER — HYDRALAZINE HCL 20 MG/ML IJ SOLN: 5.0000 mg | INTRAMUSCULAR | Status: DC | PRN

## 2023-11-10 MED ORDER — ROSUVASTATIN CALCIUM 20 MG PO TABS
20.0000 mg | ORAL_TABLET | Freq: Every day | ORAL | 0 refills | Status: DC
  Filled 2023-11-10: qty 30, 30d supply, fill #0

## 2023-11-10 MED ORDER — SODIUM CHLORIDE 0.9 % WEIGHT BASED INFUSION
1.0000 mL/kg/h | INTRAVENOUS | Status: DC
  Administered 2023-11-10: 1 mL/kg/h via INTRAVENOUS

## 2023-11-10 MED ORDER — LABETALOL HCL 5 MG/ML IV SOLN
INTRAVENOUS | Status: DC | PRN
  Administered 2023-11-10 (×2): 10 mg via INTRAVENOUS

## 2023-11-10 MED ORDER — HEPARIN (PORCINE) IN NACL 1000-0.9 UT/500ML-% IV SOLN
INTRAVENOUS | Status: DC | PRN
  Administered 2023-11-10: 1000 mL

## 2023-11-10 MED ORDER — INSULIN ASPART 100 UNIT/ML IJ SOLN
INTRAMUSCULAR | Status: AC
Start: 2023-11-10 — End: 2023-11-10
  Administered 2023-11-10: 1 [IU] via SUBCUTANEOUS
  Filled 2023-11-10: qty 1

## 2023-11-10 MED ORDER — SODIUM CHLORIDE 0.9% FLUSH: 3.0000 mL | INTRAVENOUS | Status: DC | PRN

## 2023-11-10 MED ORDER — ASPIRIN 81 MG PO TBEC
81.0000 mg | DELAYED_RELEASE_TABLET | Freq: Every day | ORAL | 0 refills | Status: DC
Start: 2023-11-11 — End: 2024-04-18
  Filled 2023-11-10: qty 30, 30d supply, fill #0

## 2023-11-10 MED ORDER — LIDOCAINE HCL (PF) 1 % IJ SOLN
INTRAMUSCULAR | Status: DC | PRN
  Administered 2023-11-10: 5 mL

## 2023-11-10 MED ORDER — HEPARIN SODIUM (PORCINE) 1000 UNIT/ML IJ SOLN
INTRAMUSCULAR | Status: AC
  Filled 2023-11-10: qty 10

## 2023-11-10 MED ORDER — LABETALOL HCL 5 MG/ML IV SOLN
INTRAVENOUS | Status: AC
  Filled 2023-11-10: qty 4

## 2023-11-10 MED ORDER — EMPAGLIFLOZIN 10 MG PO TABS
10.0000 mg | ORAL_TABLET | Freq: Every day | ORAL | 0 refills | Status: DC
  Filled 2023-11-10: qty 30, 30d supply, fill #0

## 2023-11-10 MED ORDER — FENTANYL CITRATE (PF) 100 MCG/2ML IJ SOLN
INTRAMUSCULAR | Status: DC | PRN
  Administered 2023-11-10: 50 ug via INTRAVENOUS

## 2023-11-10 MED ORDER — ENOXAPARIN SODIUM 40 MG/0.4ML IJ SOSY
PREFILLED_SYRINGE | INTRAMUSCULAR | Status: AC
  Administered 2023-11-10: 40 mg via SUBCUTANEOUS
  Filled 2023-11-10: qty 0.4

## 2023-11-10 MED ORDER — HEPARIN SODIUM (PORCINE) 1000 UNIT/ML IJ SOLN
INTRAMUSCULAR | Status: DC | PRN
  Administered 2023-11-10: 5000 [IU] via INTRAVENOUS

## 2023-11-10 MED ORDER — IODIXANOL 320 MG/ML IV SOLN
INTRAVENOUS | Status: DC | PRN
  Administered 2023-11-10: 50 mL

## 2023-11-10 MED ORDER — LIDOCAINE HCL (PF) 1 % IJ SOLN
INTRAMUSCULAR | Status: AC
  Filled 2023-11-10: qty 30

## 2023-11-10 MED ORDER — PANTOPRAZOLE SODIUM 40 MG PO TBEC
40.0000 mg | DELAYED_RELEASE_TABLET | Freq: Every day | ORAL | 0 refills | Status: DC
  Filled 2023-11-10: qty 30, 30d supply, fill #0

## 2023-11-10 MED ORDER — LABETALOL HCL 5 MG/ML IV SOLN
10.0000 mg | INTRAVENOUS | Status: DC | PRN
  Administered 2023-11-10: 10 mg via INTRAVENOUS

## 2023-11-10 MED ORDER — SODIUM CHLORIDE 0.9% FLUSH: 3.0000 mL | Freq: Two times a day (BID) | INTRAVENOUS | Status: DC

## 2023-11-10 MED ORDER — SODIUM CHLORIDE 0.9 % IV SOLN: 250.0000 mL | INTRAVENOUS | Status: DC | PRN

## 2023-11-10 MED ORDER — LIVING WELL WITH DIABETES BOOK
Freq: Once | Status: AC
  Filled 2023-11-10: qty 1

## 2023-11-10 MED ORDER — MIDAZOLAM HCL 2 MG/2ML IJ SOLN
INTRAMUSCULAR | Status: AC
  Filled 2023-11-10: qty 2

## 2023-11-10 MED ORDER — HEPARIN SODIUM (PORCINE) 5000 UNIT/ML IJ SOLN
5000.0000 [IU] | Freq: Three times a day (TID) | INTRAMUSCULAR | Status: DC
Start: 2023-11-10 — End: 2023-11-10

## 2023-11-10 MED ORDER — CLOPIDOGREL BISULFATE 75 MG PO TABS
ORAL_TABLET | ORAL | Status: AC
  Administered 2023-11-10: 75 mg via ORAL
  Filled 2023-11-10: qty 1

## 2023-11-10 MED ORDER — LABETALOL HCL 5 MG/ML IV SOLN
INTRAVENOUS | Status: AC
Start: 2023-11-10 — End: ?
  Filled 2023-11-10: qty 4

## 2023-11-10 MED ORDER — AMLODIPINE BESYLATE 5 MG PO TABS
5.0000 mg | ORAL_TABLET | Freq: Every day | ORAL | 0 refills | Status: DC
  Filled 2023-11-10: qty 30, 30d supply, fill #0

## 2023-11-10 MED ORDER — MIDAZOLAM HCL 2 MG/2ML IJ SOLN
INTRAMUSCULAR | Status: DC | PRN
  Administered 2023-11-10: 1 mg via INTRAVENOUS

## 2023-11-10 NOTE — Progress Notes (Addendum)
 STROKE TEAM PROGRESS NOTE   INTERIM HISTORY/SUBJECTIVE Carotid angio today, surgery planned for Monday. Still some hesistancy with speech but repetition intact Plan for CEA on Monday 4/7  OBJECTIVE  CBC    Component Value Date/Time   WBC 7.4 11/08/2023 0820   RBC 4.87 11/08/2023 0820   HGB 15.1 (H) 11/08/2023 0820   HGB 15.8 03/05/2016 1127   HCT 44.6 11/08/2023 0820   HCT 45.7 03/05/2016 1127   PLT 158 11/08/2023 0820   PLT 173 03/05/2016 1127   MCV 91.6 11/08/2023 0820   MCV 95 03/05/2016 1127   MCH 31.0 11/08/2023 0820   MCHC 33.9 11/08/2023 0820   RDW 13.5 11/08/2023 0820   RDW 13.5 03/05/2016 1127   LYMPHSABS 0.9 11/08/2023 0820   MONOABS 0.5 11/08/2023 0820   EOSABS 0.0 11/08/2023 0820   BASOSABS 0.0 11/08/2023 0820    BMET    Component Value Date/Time   NA 134 (L) 11/10/2023 0457   NA 139 04/15/2017 1007   K 4.5 11/10/2023 0457   CL 107 11/10/2023 0457   CO2 19 (L) 11/10/2023 0457   GLUCOSE 179 (H) 11/10/2023 0457   BUN 26 (H) 11/10/2023 0457   BUN 16 04/15/2017 1007   CREATININE 1.90 (H) 11/10/2023 0457   CREATININE 1.29 (H) 05/27/2017 0831   CALCIUM 8.5 (L) 11/10/2023 0457   GFRNONAA 30 (L) 11/10/2023 0457   GFRNONAA 48 (L) 05/27/2017 0831    IMAGING past 24 hours No results found.   Vitals:   11/10/23 0837 11/10/23 0842 11/10/23 0850 11/10/23 0900  BP: (!) 180/92 (!) 164/92 (!) 182/92 (!) 164/94  Pulse: 71 69 70 70  Resp: 18 16 18 13   Temp:      TempSrc:      SpO2: 90% 91% 92% 91%  Weight:      Height:         PHYSICAL EXAM General:  Alert, well-nourished, well-developed patient in no acute distress Psych:  Mood and affect appropriate for situation CV: Regular rate and rhythm on monitor Respiratory:  Regular, unlabored respirations on room air GI: Abdomen soft and nontender   NEURO:  Mental Status: AA&Ox3, patient is able to give clear and coherent history Speech/Language: mild hesitancy of speech with intermittent word finding  difficulties, improved.  Repetition and naming intact.   Cranial Nerves:  II: PERRL. Visual fields full.  III, IV, VI: EOMI. Eyelids elevate symmetrically.  V: Sensation is intact to light touch and symmetrical to face.  VII: Face is symmetrical resting and smiling VIII: hearing intact to voice. IX, X: Palate elevates symmetrically. Phonation is normal.  ZO:[id number] shrug 5/5. XII: tongue is midline without fasciculations. Motor: 5/5 strength to all muscle groups tested.  Tone: is normal and bulk is normal Sensation- Intact to light touch bilaterally. Extinction absent to light touch to DSS.   Coordination: FTN intact bilaterally, HKS: no ataxia in BLE.No drift.  Gait- deferred  ASSESSMENT/PLAN  Ms. NORLEEN XIE is a 58 y.o. female with history of prior stroke, depression, diabetes, GERD, hyperlipidemia, hypertension, obesity, daily smoker who presents with aphasia and mild right-sided weakness.   NIH on Admission 1  Stroke:  left frontal and BG infarct, etiology: Likely large vessel disease from left ICA bulb and siphon severe stenosis Code Stroke CT head Multiple foci of hypoattenuation and left frontal lobe basal ganglia    CTA head & neck Critically stenotic and nearly occlusive left ICA origin with only a tiny stream of contrast visible. More  distal ICA is small, but remains patent. Severe bilateral paraclinoid ICA stenosis. Severe right vertebral artery origin stenosis. Extensive  atherosclerosis of the left common carotid artery with multifocal moderate to severe stenosis. MRI  Multifocal acute infarcts within the left frontal lobe and left basal ganglia, predominantly ACA territory. No hemorrhage. 2D Echo EF 55 to 60%, no PFO Carotid US concerning for left ICA occlusion left ACA retrograde flow Carotid angiography - 50% stenosis at the ostia of the CCA. retrograde filling of the ECA, with antegrade filling of the ICA which is greater than 99% stenosis.  Widely patent  distally. LDL 100 HgbA1c 9.1 UDS negative VTE prophylaxis - Lovenox  No antithrombotic prior to admission, now on aspirin 81 mg daily and clopidogrel 75 mg daily for 3 months and then as alone given severe stenosis of ICA bulb and siphon. Therapy recommendations: None Disposition:  Pending   Carotid artery stenosis  CTA head & neck Critically stenotic and nearly occlusive left ICA origin with only a tiny stream of contrast visible. Severe bilateral paraclinoid ICA stenosis. Extensive  atherosclerosis of the left common carotid artery with multifocal moderate to severe stenosis.  Right carotid bulb atherosclerosis Vascular surgery consulted Carotid US concerning for left ICA occlusion left ACA retrograde flow Carotid angiography done - 50% stenosis at the ostia of the CCA. retrograde filling of the ECA, with antegrade filling of the ICA which is greater than 99% stenosis. Plan for CEA 4/7 On DAPT now  Hypertension Home meds:  Cozaar Stable on the high and BP goal 140-160 prior to procedure Long-term BP goal 130-150 given bilateral ICA siphon severe stenosis  Hyperlipidemia Home meds: None LDL 100, goal < 70 Now on Crestor 20 Continue statin at discharge  Diabetes type II Uncontrolled Home meds:  None HgbA1c 9.1, goal < 7.0 CBGs SSI Recommend close follow-up with PCP for better DM control  Tobacco Abuse Smokes 20 packs per year       Ready to quit? Yes Nicotine replacement therapy provided  Other Stroke Risk Factors Obesity, Body mass index is 40.64 kg/m., BMI >/= 30 associated with increased stroke risk, recommend weight loss, diet and exercise as appropriate   Other acute issues AKI on CKD 3B, creatinine 2.15-> 1.97->1.81->1.90  Hospital day # 1  Patient seen and examined by NP/APP with MD. MD to update note as needed.   Elmer Picker, DNP, FNP-BC [facility] Pager: [phone]  ATTENDING NOTE: I reviewed above note and agree with the assessment  and plan. Pt was seen and examined.   Mom and other family members are at the bedside. Pt still has speech hesitancy and word finding difficulty, unchanged. Moving all extremities, no other focal deficit. Angiogram showed left ICA high grade stenosis, not fully occluded. Will plan for CEA next Monday. On DAPT and statin. BP goal 140-160 prior to procedure. PT and OT no recs.   For detailed assessment and plan, please refer to above as I have made changes wherever appropriate.   Neurology will sign off. Please call with questions. Pt will follow up with stroke clinic NP at [facility] in about 4-6 weeks. Thanks for the consult.   Marvel Plan, MD PhD Stroke Neurology 11/10/2023 3:37 PM    To contact Stroke Continuity provider, please refer to [email or web address]. After hours, contact General Neurology

## 2023-11-10 NOTE — Telephone Encounter (Signed)
 Pharmacy Patient Advocate Encounter  Received notification from [facility] that Prior Authorization for Jardiance 10MG  tablets has been APPROVED from 11/10/2023 to 11/09/2024. Ran test claim, Copay is $4.00. This test claim was processed through [facility]- copay amounts may vary at other pharmacies due to pharmacy/plan contracts, or as the patient moves through the different stages of their insurance plan.   PA #/Case ID/Reference #: [id number] Key: Neita Garnet

## 2023-11-10 NOTE — Telephone Encounter (Signed)
 Pharmacy Patient Advocate Encounter  Received notification from [facility] that Prior Authorization for Tradjenta 5MG  tablets has been APPROVED from 11/10/2023 to 11/09/2024   PA #/Case ID/Reference #: [id number] Key: [id number]

## 2023-11-10 NOTE — Progress Notes (Signed)
 Occupational Therapy Treatment Patient Details Name: Erica Graham MRN: [id number] DOB: [birth year] Today's Date: 11/10/2023   History of present illness Patient is a 58 yo female presenting on 11/07/23 to the ED with slurred speech and AMS. All symptoms resolving prior to being assessed by MD. CTA finding acute/subacute left frontal and basal ganglia infarcts. Also noted critically stenosed left ICA at the origin with positive string sign. MRI finding Multifocal acute infarcts within the left frontal lobe and left basal ganglia, predominantly ACA territory. No hemorrhage. Angiogram and angiography of left common carotid and  the left external neck artery, common femoral artery performed on 4/1. PMH includes: CVA, anxiety, arthritis, depression, fibromyalgia, GERD, hypertension, dyslipidemia and type 2 diabetes mellitus   OT comments  Patient seen for further cognitive assesment. Patient able to complete trail making activity with numbers up to 25 in 43 seconds, complete a clock drawing without errors, and write all her vitamins she takes legibly and with no errors. OT will follow from a distance if patient stays inpatient for angioplasty, however patient does not require OT services at discharge. OT continues to recommend outpatient speech services for expressive difficulties.       If plan is discharge home, recommend the following:  Supervision due to cognitive status;Direct supervision/assist for financial management;Direct supervision/assist for medications management;Assistance with cooking/housework (Initially)   Equipment Recommendations  None recommended by OT    Recommendations for Other Services      Precautions / Restrictions Precautions Precautions: None Recall of Precautions/Restrictions: Intact Restrictions Weight Bearing Restrictions Per Provider Order: No       Mobility Bed Mobility                    Transfers                         Balance                                            ADL either performed or assessed with clinical judgement   ADL                                       Functional mobility during ADLs: Set up General ADL Comments: Patient seen for further cognitive assesment. Patient able to complete trail making activity with numbers up to 25 in 43 seconds, complete a clock drawing without errors, and write all her vitamins she takes legibly and with no errors. OT will follow from a distance if patient stays inpatient for angioplasty, however patient does not require OT services at discharge. OT continues to recommend outpatient speech services for expressive difficulties.    Extremity/Trunk Assessment              Occupational psychologist Communication: Impaired Factors Affecting Communication: Difficulty expressing self   Cognition Arousal: Alert Behavior During Therapy: WFL for tasks assessed/performed Cognition: Cognition impaired     Awareness: Intellectual awareness intact Memory impairment (select all impairments): Short-term memory Attention impairment (select first level of impairment): Selective attention   OT - Cognition Comments: Patient with improved expressive abilities, cognitive assessments performed with no acute findings.  Following commands: Intact        Cueing   Cueing Techniques: Verbal cues  Exercises      Shoulder Instructions       General Comments      Pertinent Vitals/ Pain       Pain Assessment Pain Assessment: No/denies pain  Home Living                                          Prior Functioning/Environment              Frequency  Min 1X/week        Progress Toward Goals  OT Goals(current goals can now be found in the care plan section)  Progress towards OT goals: Progressing toward goals  Acute Rehab OT Goals Patient  Stated Goal: to get better OT Goal Formulation: With patient/family Time For Goal Achievement: 11/22/23 Potential to Achieve Goals: Good  Plan      Co-evaluation                 AM-PAC OT "6 Clicks" Daily Activity     Outcome Measure   Help from another person eating meals?: A Little Help from another person taking care of personal grooming?: A Little Help from another person toileting, which includes using toliet, bedpan, or urinal?: A Little Help from another person bathing (including washing, rinsing, drying)?: A Little Help from another person to put on and taking off regular upper body clothing?: A Little Help from another person to put on and taking off regular lower body clothing?: A Little 6 Click Score: 18    End of Session    OT Visit Diagnosis: Other symptoms and signs involving cognitive function   Activity Tolerance Patient tolerated treatment well   Patient Left in bed;with call bell/phone within reach;with family/visitor present   Nurse Communication Mobility status        Time: 6578-4696 OT Time Calculation (min): 12 min  Charges: OT General Charges $OT Visit: 1 Visit OT Treatments $Therapeutic Activity: 8-22 mins  Pollyann Glen E. Akanksha Bellmore, OTR/L Acute Rehabilitation Services [phone]   Cherlyn Cushing 11/10/2023, 3:39 PM

## 2023-11-10 NOTE — Discharge Instructions (Signed)
Follow with Primary MD  in 7 days   Get CBC, CMP,  checked  by Primary MD next visit.    Activity: As tolerated with Full fall precautions use walker/cane & assistance as needed   Disposition Home    Diet: Heart Healthy/carb modified   On your next visit with your primary care physician please Get Medicines reviewed and adjusted.   Please request your Prim.MD to go over all Hospital Tests and Procedure/Radiological results at the follow up, please get all Hospital records sent to your Prim MD by signing hospital release before you go home.   If you experience worsening of your admission symptoms, develop shortness of breath, life threatening emergency, suicidal or homicidal thoughts you must seek medical attention immediately by calling 911 or calling your MD immediately  if symptoms less severe.  You Must read complete instructions/literature along with all the possible adverse reactions/side effects for all the Medicines you take and that have been prescribed to you. Take any new Medicines after you have completely understood and accpet all the possible adverse reactions/side effects.   Do not drive, operating heavy machinery, perform activities at heights, swimming or participation in water activities or provide baby sitting services if your were admitted for syncope or siezures until you have seen by Primary MD or a Neurologist and advised to do so again.  Do not drive when taking Pain medications.    Do not take more than prescribed Pain, Sleep and Anxiety Medications  Special Instructions: If you have smoked or chewed Tobacco  in the last 2 yrs please stop smoking, stop any regular Alcohol  and or any Recreational drug use.  Wear Seat belts while driving.   Please note  You were cared for by a hospitalist during your hospital stay. If you have any questions about your discharge medications or the care you received while you were in the hospital after you are discharged,  you can call the unit and asked to speak with the hospitalist on call if the hospitalist that took care of you is not available. Once you are discharged, your primary care physician will handle any further medical issues. Please note that NO REFILLS for any discharge medications will be authorized once you are discharged, as it is imperative that you return to your primary care physician (or establish a relationship with a primary care physician if you do not have one) for your aftercare needs so that they can reassess your need for medications and monitor your lab values.

## 2023-11-10 NOTE — Plan of Care (Signed)

## 2023-11-10 NOTE — Op Note (Signed)
    [name] MRN: [id number] DOB: October 06, 1965 Sex: female  11/10/2023 Pre-operative Diagnosis: Symptomatic left internal carotid artery stenosis versus occlusion Post-operative diagnosis:  Same Surgeon:  Victorino Sparrow, MD Procedure Performed: 1.  Ultrasound-guided micropuncture access of the right common femoral artery in retrograde fashion 2.  Arch angiogram 3.  Selective angiography from the left common carotid artery 4.  Selective angiography of the left external neck artery, common femoral artery 5.  Device assisted closure-Mynx 6.  Moderate sedation time 26 minutes, contrast volume 50 mL   Indications: Patient is a 58 year old female with history of recent left-sided stroke.  Imaging demonstrated stenosis versus occlusion of the left carotid bulb.  After discussing risks and benefits of arch angiogram, selective angiography of the left common carotid artery in an effort to define occlusion versus stenosis, [name] elected to proceed.  Findings:   Widely patent innominate continuing into the right subclavian, right common carotid artery.  External and internal carotid arteries without stenosis.  Slow filling of the left common carotid with mild disease throughout.  50% stenosis at the ostia of the common carotid artery. Severe atherosclerotic disease appreciated at the carotid bulb with a very small flow lumen appreciated.  Arch angiogram demonstrates retrograde filling of the external carotid artery, with antegrade filling of the internal carotid artery.  Selective angiography demonstrates patent internal carotid artery with greater than 99% stenosis.  Widely patent distally.  Left vertebral artery with aortic origin percent stenosis at the origin, otherwise widely patent, large artery. Left subclavian artery with atherosclerotic disease at the origin.  Unable to quantify level of stenosis.  Appears less than 50%.  Widely patent right external iliac artery,  common femoral artery      Procedure:  The patient was identified in the holding area and taken to room 8.  The patient was then placed supine on the table and prepped and draped in the usual sterile fashion.  A time out was called.  Ultrasound was used to evaluate the right common femoral artery.  It was patent .  A digital ultrasound image was acquired.  A micropuncture needle was used to access the right common femoral artery under ultrasound guidance.  An 018 wire was advanced without resistance and a micropuncture sheath was placed.  The 018 wire was removed and a benson wire was placed.  The micropuncture sheath was exchanged for a 5 french sheath.  The patient was heparinized and a pigtail flush catheter was positioned in the aortic arch.  Arch angiography followed in several views.  Next, a SIM 2 catheter was brought to the field and selective angiography occurred from the left common carotid artery.  See results above. Angiography then followed from the right sided common carotid artery sheath to ensure adequate access for closure.  See results above.  Mynx device was used for closure without issue.  The patient was taken the PACU in stable condition.   Victorino Sparrow MD [facility] Office: [phone]

## 2023-11-10 NOTE — TOC Initial Note (Signed)
 Transition of Care [facility]) - Initial/Assessment Note    Patient Details  Name: Erica Graham MRN: [id number] Date of Birth: 1965/10/03  Transition of [facility] CM/SW Contact:    Gala Lewandowsky, RN Phone Number: 11/10/2023, 2:50 PM  Clinical Narrative:  Patient transferred from 5-W to unit 6-E. Patent  presented for  aphasia and altered mental status. Case Manager spoke with patient today and she states she is from home alone;however, has family support when needed. Case Manager discussed outpatient Speech therapy and the patient lives in [place]. Patient wants to go to the [facility] in [place] on [address]. Ambulatory referral submitted and office will call to schedule a visit time. Patient states she will have transportation to appointments. No further home needs identified at this time.                Expected Discharge Plan: Home/Self Care Barriers to Discharge: No Barriers Identified   Patient Goals and CMS Choice Patient states their goals for this hospitalization and ongoing recovery are:: plan to return home once stable.  Expected Discharge Plan and Services   Discharge Planning Services: CM Consult Post Acute Care Choice:  (Outpatient Rehab in [place]) Living arrangements for the past 2 months: Single Family Home  Prior Living Arrangements/Services Living arrangements for the past 2 months: Single Family Home Lives with:: Self Patient language and need for interpreter reviewed:: Yes Do you feel safe going back to the place where you live?: Yes      Need for Family Participation in Patient Care: No (Comment) Care giver support system in place?: No (comment)   Criminal Activity/Legal Involvement Pertinent to Current Situation/Hospitalization: No - Comment as needed  Activities of Daily Living   ADL Screening (condition at time of admission) Independently performs ADLs?: Yes (appropriate for developmental age) Is the patient deaf or have difficulty  hearing?: No Does the patient have difficulty seeing, even when wearing glasses/contacts?: No Does the patient have difficulty concentrating, remembering, or making decisions?: No  Permission Sought/Granted Permission sought to share information with : Case Manager, Family Supports   Emotional Assessment Appearance:: Appears stated age Attitude/Demeanor/Rapport: Engaged Affect (typically observed): Appropriate Orientation: : Oriented to Self, Oriented to Place, Oriented to  Time, Oriented to Situation Alcohol / Substance Use: Not Applicable Psych Involvement: No (comment)  Admission diagnosis:  Acute CVA (cerebrovascular accident) [facility]) [I63.9] Patient Active Problem List   Diagnosis Date Noted   Essential hypertension 11/08/2023   Controlled type 2 diabetes mellitus without complication, without long-term current use of insulin (HCC) 11/08/2023   Acute kidney injury superimposed on chronic kidney disease (HCC) 11/08/2023   Acute CVA (cerebrovascular accident) (HCC) 11/07/2023   Abdominal aortic atherosclerosis (HCC) 06/03/2017   Primary osteoarthritis of both hands 04/08/2017   Primary osteoarthritis of both feet 04/08/2017   Primary osteoarthritis of both knees 04/08/2017   scoliosis, lumbar region 04/08/2017   DDD (degenerative disc disease), lumbar 04/08/2017   Positive ANA (antinuclear antibody) 04/08/2017   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 - [place], [place] - [address] [place]  [place] [address] Phone: [phone] Fax: [phone]  Social Drivers of Health (SDOH) Social History: SDOH Screenings   Food Insecurity: No Food Insecurity (11/08/2023)  Housing: Low Risk  (11/08/2023)  Transportation Needs: No Transportation Needs (11/08/2023)  Utilities: Not At Risk (11/08/2023)  Tobacco Use: High Risk (11/07/2023)   Readmission Risk Interventions     No data to display

## 2023-11-10 NOTE — Plan of Care (Signed)
 Pt discharged to home   Problem: Education: Goal: Knowledge of General Education information will improve Description: Including pain rating scale, medication(s)/side effects and non-pharmacologic comfort measures Outcome: Completed/Met   Problem: Health Behavior/Discharge Planning: Goal: Ability to manage health-related needs will improve Outcome: Completed/Met   Problem: Clinical Measurements: Goal: Ability to maintain clinical measurements within normal limits will improve Outcome: Completed/Met Goal: Will remain free from infection Outcome: Completed/Met Goal: Diagnostic test results will improve Outcome: Completed/Met Goal: Respiratory complications will improve Outcome: Completed/Met Goal: Cardiovascular complication will be avoided Outcome: Completed/Met   Problem: Activity: Goal: Risk for activity intolerance will decrease Outcome: Completed/Met   Problem: Nutrition: Goal: Adequate nutrition will be maintained Outcome: Completed/Met   Problem: Coping: Goal: Level of anxiety will decrease Outcome: Completed/Met   Problem: Elimination: Goal: Will not experience complications related to bowel motility Outcome: Completed/Met Goal: Will not experience complications related to urinary retention Outcome: Completed/Met   Problem: Pain Managment: Goal: General experience of comfort will improve and/or be controlled Outcome: Completed/Met   Problem: Safety: Goal: Ability to remain free from injury will improve Outcome: Completed/Met   Problem: Skin Integrity: Goal: Risk for impaired skin integrity will decrease Outcome: Completed/Met   Problem: Education: Goal: Ability to describe self-care measures that may prevent or decrease complications (Diabetes Survival Skills Education) will improve Outcome: Completed/Met Goal: Individualized Educational Video(s) Outcome: Completed/Met   Problem: Coping: Goal: Ability to adjust to condition or change in health will  improve Outcome: Completed/Met   Problem: Fluid Volume: Goal: Ability to maintain a balanced intake and output will improve Outcome: Completed/Met   Problem: Health Behavior/Discharge Planning: Goal: Ability to identify and utilize available resources and services will improve Outcome: Completed/Met Goal: Ability to manage health-related needs will improve Outcome: Completed/Met   Problem: Metabolic: Goal: Ability to maintain appropriate glucose levels will improve Outcome: Completed/Met   Problem: Nutritional: Goal: Maintenance of adequate nutrition will improve Outcome: Completed/Met Goal: Progress toward achieving an optimal weight will improve Outcome: Completed/Met   Problem: Skin Integrity: Goal: Risk for impaired skin integrity will decrease Outcome: Completed/Met   Problem: Tissue Perfusion: Goal: Adequacy of tissue perfusion will improve Outcome: Completed/Met   Problem: Education: Goal: Knowledge of disease or condition will improve Outcome: Completed/Met Goal: Knowledge of secondary prevention will improve (MUST DOCUMENT ALL) Outcome: Completed/Met Goal: Knowledge of patient specific risk factors will improve (DELETE if not current risk factor) Outcome: Completed/Met   Problem: Ischemic Stroke/TIA Tissue Perfusion: Goal: Complications of ischemic stroke/TIA will be minimized Outcome: Completed/Met   Problem: Coping: Goal: Will verbalize positive feelings about self Outcome: Completed/Met Goal: Will identify appropriate support needs Outcome: Completed/Met   Problem: Health Behavior/Discharge Planning: Goal: Ability to manage health-related needs will improve Outcome: Completed/Met Goal: Goals will be collaboratively established with patient/family Outcome: Completed/Met   Problem: Self-Care: Goal: Ability to participate in self-care as condition permits will improve Outcome: Completed/Met Goal: Verbalization of feelings and concerns over difficulty  with self-care will improve Outcome: Completed/Met Goal: Ability to communicate needs accurately will improve Outcome: Completed/Met   Problem: Nutrition: Goal: Risk of aspiration will decrease Outcome: Completed/Met Goal: Dietary intake will improve Outcome: Completed/Met   Problem: Education: Goal: Understanding of CV disease, CV risk reduction, and recovery process will improve Outcome: Completed/Met Goal: Individualized Educational Video(s) Outcome: Completed/Met   Problem: Activity: Goal: Ability to return to baseline activity level will improve Outcome: Completed/Met   Problem: Cardiovascular: Goal: Ability to achieve and maintain adequate cardiovascular perfusion will improve Outcome: Completed/Met Goal: Vascular access site(s) Level  0-1 will be maintained Outcome: Completed/Met   Problem: Health Behavior/Discharge Planning: Goal: Ability to safely manage health-related needs after discharge will improve Outcome: Completed/Met

## 2023-11-10 NOTE — Inpatient Diabetes Management (Signed)
 Inpatient Diabetes Program Recommendations  AACE/ADA: New Consensus Statement on Inpatient Glycemic Control (2015)  Target Ranges:  Prepandial:   less than 140 mg/dL      Peak postprandial:   less than 180 mg/dL (1-2 hours)      Critically ill patients:  140 - 180 mg/dL   Lab Results  Component Value Date   GLUCAP 129 (H) 11/10/2023   HGBA1C 9.1 (H) 11/08/2023    Review of Glycemic Control  Diabetes history: New Diabetes 2 Diagnosis Outpatient Diabetes medications: metformin in the past  Current orders for Inpatient glycemic control:  Novolog 0-6 units tid + hs  Renal impairment Unsure if SGLT2 is appropriate even if Jardiance would be $4 copay  May consider Tradjenta 5 mg Daily and a blood glucose monitor at home order # [id number]  Spoke with pt at bedside regarding Diabetes. Pt reports having an elevated A1c level around 10 years ago around a 7.3% pt was prescribed metformin but never took it. Pt changed lifestyle and dropped her A1c down to normal level. Pt reports eating once a day. She would snack on chips midday and later in the evening would have a protein veggie and starch. Discussed current A1c level of 9% this admission. Discussed glucose and A1c goals. Reviewed lifestyle modifications. Encouraged glucose control to prevent complications for uncontrolled glucose levels. Reviewed Living Well with diabetes booklet and encouraged patient to read through entire book. Reviewed signs and symptoms of hyperglycemia and hypoglycemia along with treatment for both. Discussed impact of nutrition, exercise, stress, sickness, and medications on diabetes control.  Asked patient to check her glucose 2 times per day (fasting and alternating second check in the evening) and to keep a log book of glucose readings and insulin taken. Patient verbalized understanding of information discussed and he states that he has no further questions at this time related to diabetes. RNs to provide ongoing basic DM  education at bedside with this patient and engage patient to actively check blood glucose.   Thanks,  Christena Deem RN, MSN, BC-ADM Inpatient Diabetes Coordinator Team Pager [phone] (8a-5p)

## 2023-11-10 NOTE — Telephone Encounter (Signed)
 Patient Product/process development scientist completed.    The patient is insured through [facility].     Ran test claim for Farxgia 10 mg and Requires Prior Authorization  Ran test claim for Jardiance 10 mg and Requires Prior Authorization   This test claim was processed through [employer]- copay amounts may vary at other pharmacies due to [employer], or as the patient moves through the different stages of their insurance plan.     Roland Earl, CPHT Pharmacy Technician III Certified Patient Advocate [facility] Patient Advocate Team Direct Number: [phone]  Fax: [phone]

## 2023-11-10 NOTE — Progress Notes (Signed)
 [facility]  Subjective  -no neurologic events overnight, no complaints this morning.   Objective (!) 167/102 66 98 F (36.7 C) (Oral) 16 93%  Intake/Output Summary (Last 24 hours) at 11/10/2023 0747 Last data filed at 11/10/2023 0622 Gross per 24 hour  Intake 789.16 ml  Output --  Net 789.16 ml    Grossly neurologically intact Right femoral pulse palpable  Laboratory Lab Results: Recent Labs    11/07/23 2053 11/08/23 0820  WBC 9.1 7.4  HGB 15.9* 15.1*  HCT 46.9* 44.6  PLT 179 158   BMET Recent Labs    11/09/23 0501 11/10/23 0457  NA 137 134*  K 4.4 4.5  CL 108 107  CO2 20* 19*  GLUCOSE 153* 179*  BUN 24* 26*  CREATININE 1.81* 1.90*  CALCIUM 8.8* 8.5*    COAG Lab Results  Component Value Date   INR 1.0 11/07/2023   No results found for: "PTT"  Assessment/Planning:   58 year old female with hypertension, hyperlipidemia, diabetes that vascular surgery has been consulted for symptomatic left ICA stenosis.  Presents here as a transfer from [facility].  States she had slurred speech on Saturday.  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  CTA neck reviewed with high-grade critical stenosis of the left ICA with severely diseased common carotid artery more proximal with extensive atherosclerosis. There is concern on duplex that the carotid buld could be occluded. Discussed with the patient the discrepancy between her CTA and ultrasound.  I do believe there is a flow channel on the CTA neck.  I have recommended a carotid angiogram tomorrow in the Cath Lab to confirm that carotid revascularization is still indicated. She is aware this comes with a small risk of stroke. After discussing hte risks and benefits of surgery, [name] elected to proceed.    Erica Graham 11/10/2023 7:47 AM --

## 2023-11-10 NOTE — Discharge Summary (Signed)
 Physician Discharge Summary  ECE CUMBERLAND ZOX:[id number] DOB: [birth year] DOA: 11/07/2023  PCP: Pcp, No  Admit date: 11/07/2023 Discharge date: 11/10/2023  Admitted From: (Home) Disposition:  (Home )  Recommendations for Outpatient Follow-up:  Follow up with PCP in 1-2 weeks Please obtain BMP/CBC in one week Plan for endarterectomy on/02/2024  Diet recommendation: Heart Healthy / Carb Modified  Brief/Interim Summary:  58 y.o. Caucasian female with medical history significant for anxiety, arthritis, depression, fibromyalgia, GERD, hypertension, dyslipidemia and type 2 diabetes mellitus, who presented to the emergency room with acute onset of altered mental status, staring into space and expressive dysphasia with inability to communicate since yesterday.  This started around 1:40 PM on Saturday.  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  She was admitted to [facility] and transferred to [facility] for further care.      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  - Seen by neurology, symptoms mostly dysarthria, signed window of thrombectomy or TNA, full stroke workup, currently on DAPT, has been loaded with Plavix 300 mg x 1 as well.   -She is not on any antithrombotic prior to admission, currently on aspirin and Plavix for 26-month giving severe stenosis of ICA pulp and siphon.   -Carotid ultrasound concerning for left ICA occlusion, left ACA retrograde flow, went  for carotid angiogram today by vascular surgery given discrepancy in ultrasound and CT angio to decide if intervention is required.  Workup significant for patent internal carotid artery with greater than 99% stenosis, widely patent distally, so recommendation for endarterectomy on 11/15/2023 to be arranged as an outpatient as discussed with vascular surgery.  Carotid artery  stenosis -Carotid ultrasound concerning for left ICA occlusion with left ACA retrograde flow, please see above discussion -Carotid angiography done - 50% stenosis at the ostia of the CCA. retrograde filling of the ECA, with antegrade filling of the ICA which is greater than 99% stenosis. -Plan for CEA 4/7 -On DAPT now   Acute kidney injury superimposed on chronic kidney disease stage IIIa ?Marland Kitchen  Baseline creatinine around 1.3 but this is in 2018, she could have progressed her CKD further and this could be close to her baseline.  Some element of dehydration, also received IV dye for CTA, continue gentle hydration, will give few doses of Mucomyst, continue to monitor closely. -Toprol has been discontinued on discharge   Essential hypertension  - We will allow permissive hypertension. We will hold off Cozaar.  Even her AKI she was started on amlodipine instead for blood pressure control.   Morbid Obesity with BMI of 40.  Follow with PCP for weight loss.     Controlled type 2 diabetes mellitus without complication, without long-term current use of insulin (HCC)  - The patient will be placed on supplemental coverage with NovoLog.    Hyperlipidemia -LDL 100, goal < 70 not on any home medication, she was started on Crestor    Diabetes type II Uncontrolled -A1c is 9.1, started on Jardiance    Tobacco Abuse -She was counseled  Discharge Diagnoses:  Principal Problem:   Acute CVA (cerebrovascular accident) [facility]) Active Problems:   Acute kidney injury superimposed on chronic kidney disease (HCC)   Essential hypertension   Controlled type 2 diabetes mellitus without complication, without long-term current use of insulin [facility])    Discharge Instructions  Discharge Instructions  Ambulatory referral to Neurology   Complete by: As directed    Follow up with stroke clinic NP at [facility] in about 4 -6 weeks. Thanks.   Ambulatory referral to Speech Therapy   Complete by: As directed     Outpatient speech therapy evaluation and treatment.   Diet - low sodium heart healthy   Complete by: As directed    Discharge instructions   Complete by: As directed    Follow with Primary MD in 7 days   Get CBC, CMP, checked  by Primary MD next visit.    Activity: As tolerated with Full fall precautions use walker/cane & assistance as needed   Disposition Home    Diet: Heart Healthy /carb modified  On your next visit with your primary care physician please Get Medicines reviewed and adjusted.   Please request your Prim.MD to go over all Hospital Tests and Procedure/Radiological results at the follow up, please get all Hospital records sent to your Prim MD by signing hospital release before you go home.   If you experience worsening of your admission symptoms, develop shortness of breath, life threatening emergency, suicidal or homicidal thoughts you must seek medical attention immediately by calling 911 or calling your MD immediately  if symptoms less severe.  You Must read complete instructions/literature along with all the possible adverse reactions/side effects for all the Medicines you take and that have been prescribed to you. Take any new Medicines after you have completely understood and accpet all the possible adverse reactions/side effects.   Do not drive, operating heavy machinery, perform activities at heights, swimming or participation in water activities or provide baby sitting services if your were admitted for syncope or siezures until you have seen by Primary MD or a Neurologist and advised to do so again.  Do not drive when taking Pain medications.    Do not take more than prescribed Pain, Sleep and Anxiety Medications  Special Instructions: If you have smoked or chewed Tobacco  in the last 2 yrs please stop smoking, stop any regular Alcohol  and or any Recreational drug use.  Wear Seat belts while driving.   Please note  You were cared for by a hospitalist  during your hospital stay. If you have any questions about your discharge medications or the care you received while you were in the hospital after you are discharged, you can call the unit and asked to speak with the hospitalist on call if the hospitalist that took care of you is not available. Once you are discharged, your primary care physician will handle any further medical issues. Please note that NO REFILLS for any discharge medications will be authorized once you are discharged, as it is imperative that you return to your primary care physician (or establish a relationship with a primary care physician if you do not have one) for your aftercare needs so that they can reassess your need for medications and monitor your lab values.   Increase activity slowly   Complete by: As directed       Allergies as of 11/10/2023       Reactions   Lipitor [atorvastatin] Other (See Comments)   Myalgia and joint pain        Medication List     STOP taking these medications    DULoxetine 30 MG capsule Commonly known as: Cymbalta   ibuprofen 200 MG tablet Commonly known as: ADVIL   losartan 25 MG tablet Commonly known as: COZAAR   predniSONE  5 MG tablet Commonly known as: DELTASONE       TAKE these medications    albuterol 108 (90 Base) MCG/ACT inhaler Commonly known as: VENTOLIN HFA Inhale 2 puffs into the lungs every 6 (six) hours as needed for wheezing or shortness of breath.   amLODipine 5 MG tablet Commonly known as: NORVASC Take 1 tablet (5 mg total) by mouth daily.   aspirin EC 81 MG tablet Take 1 tablet (81 mg total) by mouth daily. Swallow whole. Start taking on: November 11, 2023   clopidogrel 75 MG tablet Commonly known as: PLAVIX Take 1 tablet (75 mg total) by mouth daily. Start taking on: November 11, 2023   diphenhydrAMINE 25 MG tablet Commonly known as: BENADRYL Take 50 mg by mouth at bedtime as needed for sleep.   Jardiance 10 MG Tabs tablet Generic drug:  empagliflozin Take 1 tablet (10 mg total) by mouth daily before breakfast.   nicotine 21 mg/24hr patch Commonly known as: NICODERM CQ - dosed in mg/24 hours Place 1 patch (21 mg total) onto the skin daily.   pantoprazole 40 MG tablet Commonly known as: Protonix Take 1 tablet (40 mg total) by mouth daily.   rosuvastatin 20 MG tablet Commonly known as: CRESTOR Take 1 tablet (20 mg total) by mouth daily. Start taking on: November 11, 2023   SYSTANE OP Apply 1 drop to eye as needed.   Vitamin D3 125 MCG (5000 UT) Caps Take 1 capsule (5,000 Units total) by mouth daily.        Follow-up Information     [facility] Follow up.   Specialty: Rehabilitation Why: SLP evaluation and treatment-office to call with visit times. Contact information: [address] [place] [address] [phone]        [facility]. Schedule an appointment as soon as possible for a visit in 1 month(s).   Specialty: Neurology Why: stroke clinic Contact information: [address] [place] [address] [phone]               Allergies  Allergen Reactions   Lipitor [Atorvastatin] Other (See Comments)    Myalgia and joint pain    Consultations: Neurology Vascular surgery   Procedures/Studies: PERIPHERAL VASCULAR CATHETERIZATION Result Date: 11/10/2023 Images from the original result were not included.   Patient name: BUNNY KLEIST      MRN: [id number]        DOB: [birth year]        Sex: female  11/10/2023 Pre-operative Diagnosis: Symptomatic left internal carotid artery stenosis versus occlusion Post-operative diagnosis:  Same Surgeon:  Victorino Sparrow, MD Procedure Performed: 1.  Ultrasound-guided micropuncture access of the right common femoral artery in retrograde fashion 2.  Arch angiogram 3.  Selective angiography from the left common carotid artery 4.  Selective  angiography of the left external neck artery, common femoral artery 5.  Device assisted closure-Mynx 6.  Moderate sedation time 26 minutes, contrast volume 50 mL   Indications: Patient is a 58 year old female with history of recent left-sided stroke.  Imaging demonstrated stenosis versus occlusion of the left carotid bulb.  After discussing risks and benefits of arch angiogram, selective angiography of the left common carotid artery in an effort to define occlusion versus stenosis, Bellanie elected to proceed.  Findings:  Widely patent innominate continuing into the right subclavian, right common carotid artery.  External and internal carotid arteries without stenosis.  Slow filling  of the left common carotid with mild disease throughout.  50% stenosis at the ostia of the common carotid artery. Severe atherosclerotic disease appreciated at the carotid bulb with a very small flow lumen appreciated.  Arch angiogram demonstrates retrograde filling of the external carotid artery, with antegrade filling of the internal carotid artery.  Selective angiography demonstrates patent internal carotid artery with greater than 99% stenosis.  Widely patent distally.  Left vertebral artery with aortic origin percent stenosis at the origin, otherwise widely patent, large artery. Left subclavian artery with atherosclerotic disease at the origin.  Unable to quantify level of stenosis.  Appears less than 50%.  Widely patent right external iliac artery, common femoral artery                Procedure:  The patient was identified in the holding area and taken to room 8.  The patient was then placed supine on the table and prepped and draped in the usual sterile fashion.  A time out was called.  Ultrasound was used to evaluate the right common femoral artery.  It was patent .  A digital ultrasound image was acquired.  A micropuncture needle was used to access the right common femoral artery under ultrasound guidance.  An 018 wire was  advanced without resistance and a micropuncture sheath was placed.  The 018 wire was removed and a benson wire was placed.  The micropuncture sheath was exchanged for a 5 french sheath.  The patient was heparinized and a pigtail flush catheter was positioned in the aortic arch.  Arch angiography followed in several views.  Next, a SIM 2 catheter was brought to the field and selective angiography occurred from the left common carotid artery.  See results above. Angiography then followed from the right sided common carotid artery sheath to ensure adequate access for closure.  See results above.  Mynx device was used for closure without issue.  The patient was taken the PACU in stable condition.   Victorino Sparrow MD [facility] Office: [phone]   VAS US CAROTID Result Date: 11/09/2023 Carotid Arterial Duplex Study Patient Name:  TERASA ORSINI [facility]  Date of Exam:   11/08/2023 Medical Rec #: [id number]         Accession #:    [id number] Date of Birth: 08/20/1965        Patient Gender: F Patient Age:   40 years Exam Location:  [facility] Procedure:      VAS US CAROTID Referring Phys: Graceann Congress --------------------------------------------------------------------------------  Indications:       Carotid artery disease Risk Factors:      Hypertension, hyperlipidemia, Diabetes, current smoker, prior                    CVA. Comparison Study:  No previous carotid duplex exams. CTA on 11/07/2023 showed                    critical stenosis of left ICA Performing Technologist: Ernestene Mention RVT, RDMS  Examination Guidelines: A complete evaluation includes B-mode imaging, spectral Doppler, color Doppler, and power Doppler as needed of all accessible portions of each vessel. Bilateral testing is considered an integral part of a complete examination. Limited examinations for reoccurring indications may be performed as noted.  Right Carotid Findings:  +----------+--------+--------+--------+-------------------------------+--------+           PSV cm/sEDV cm/sStenosisPlaque Description  Comments +----------+--------+--------+--------+-------------------------------+--------+ CCA Prox  117     21              heterogenous and calcific               +----------+--------+--------+--------+-------------------------------+--------+ CCA Distal107     25              calcific, hypoechoic and                                                  heterogenous                            +----------+--------+--------+--------+-------------------------------+--------+ ICA Prox  73      28                                                      +----------+--------+--------+--------+-------------------------------+--------+ ICA Distal53      18                                                      +----------+--------+--------+--------+-------------------------------+--------+ ECA       100     10                                                      +----------+--------+--------+--------+-------------------------------+--------+ +----------+--------+-------+----------------+-------------------+           PSV cm/sEDV cmsDescribe        Arm Pressure (mmHG) +----------+--------+-------+----------------+-------------------+ Subclavian101            Multiphasic, WNL                    +----------+--------+-------+----------------+-------------------+ +---------+--------+--+--------+--+---------+ VertebralPSV cm/s29EDV cm/s11Antegrade +---------+--------+--+--------+--+---------+  Left Carotid Findings: +----------+--------+--------+--------+------------------------+---------------+           PSV cm/sEDV cm/sStenosisPlaque Description      Comments        +----------+--------+--------+--------+------------------------+---------------+ CCA Prox  18      0               calcific and hyperechoic                 +----------+--------+--------+--------+------------------------+---------------+ CCA Mid   23      0               calcific and                                                              heterogenous                            +----------+--------+--------+--------+------------------------+---------------+ CCA Distal25  5               calcific and                                                              heterogenous                            +----------+--------+--------+--------+------------------------+---------------+ ICA Prox  277     142     80-99%                                          +----------+--------+--------+--------+------------------------+---------------+ ICA Mid   46      18                                                      +----------+--------+--------+--------+------------------------+---------------+ ICA Distal44      22                                                      +----------+--------+--------+--------+------------------------+---------------+ ECA       68      36              calcific                retrograde flow +----------+--------+--------+--------+------------------------+---------------+ +----------+--------+--------+--------+-------------------+           PSV cm/sEDV cm/sDescribeArm Pressure (mmHG) +----------+--------+--------+--------+-------------------+ [id number]             Stenotic                    +----------+--------+--------+--------+-------------------+ +---------+--------+--+--------+--+---------+ VertebralPSV cm/s58EDV cm/s16Antegrade +---------+--------+--+--------+--+---------+ ECA flows into bulb then into ICA.  Summary: Right Carotid: Velocities in the right ICA are consistent with a 1-39% stenosis.                Non-hemodynamically significant plaque <50% noted in the CCA. The                ECA appears <50% stenosed. Left Carotid: Velocities in the left ICA  are consistent with a 80-99% stenosis.               Occlusion in left carotid bulb. ECA flows retrograde into bulb,               providing flow to ICA. Bidirectional flow seen in proximal ICA.  *See table(s) above for measurements and observations.  Electronically signed by Lemar Livings MD on 11/09/2023 at 4:44:13 PM.    Final    MR BRAIN WO CONTRAST Result Date: 11/08/2023 CLINICAL DATA:  Acute neurologic deficit EXAM: MRI HEAD WITHOUT CONTRAST TECHNIQUE: Multiplanar, multiecho pulse sequences of the brain and surrounding structures were obtained without intravenous contrast. COMPARISON:  None Available. FINDINGS: Brain: Multifocal abnormal diffusion restriction within the left frontal lobe and left basal ganglia. No  acute or chronic hemorrhage. Cytotoxic edema within the anterior left frontal lobe. No hydrocephalus. The midline structures are normal. Vascular: Normal flow voids. Skull and upper cervical spine: Normal calvarium and skull base. Visualized upper cervical spine and soft tissues are normal. Sinuses/Orbits:Mastoid effusions. Paranasal sinuses are clear. Normal orbits. IMPRESSION: Multifocal acute infarcts within the left frontal lobe and left basal ganglia, predominantly ACA territory. No hemorrhage. Electronically Signed   By: Deatra Robinson M.D.   On: 11/08/2023 19:49   ECHOCARDIOGRAM COMPLETE BUBBLE STUDY Result Date: 11/08/2023    ECHOCARDIOGRAM REPORT   Patient Name:   KEIAIRA DONLAN Date of Exam: 11/08/2023 Medical Rec #:  [id number]        Height:       66.0 in Accession #:    [id number]       Weight:       251.8 lb Date of Birth:  [birth year]       BSA:          2.205 m Patient Age:    57 years         BP:           160/80 mmHg Patient Gender: F                HR:           76 bpm. Exam Location:  Inpatient Procedure: 2D Echo, Cardiac Doppler, Color Doppler and Saline Contrast Bubble            Study (Both Spectral and Color Flow Doppler were utilized during            procedure).  Indications:    Stroke  History:        Patient has no prior history of Echocardiogram examinations.                 Risk Factors:Hypertension, Diabetes, Dyslipidemia and Current                 Smoker.  Sonographer:    Karma Ganja Referring Phys: [id number] JAN A MANSY  Sonographer Comments: Technically challenging study due to limited acoustic windows and patient is obese. Image acquisition challenging due to uncooperative patient. IMPRESSIONS  1. Left ventricular ejection fraction, by estimation, is 55 to 60%. The left ventricle has normal function. The left ventricle has no regional wall motion abnormalities. There is moderate concentric left ventricular hypertrophy. Left ventricular diastolic parameters are consistent with Grade I diastolic dysfunction (impaired relaxation). Elevated left atrial pressure.  2. Right ventricular systolic function is normal. The right ventricular size is normal. Tricuspid regurgitation signal is inadequate for assessing PA pressure.  3. Left atrial size was moderately dilated.  4. The mitral valve is degenerative. No evidence of mitral valve regurgitation. Mild mitral stenosis. The mean mitral valve gradient is 4.0 mmHg with average heart rate of 82 bpm. Moderate to severe mitral annular calcification.  5. The aortic valve was not well visualized. Aortic valve regurgitation is not visualized. No aortic stenosis is present.  6. The inferior vena cava is normal in size with greater than 50% respiratory variability, suggesting right atrial pressure of 3 mmHg.  7. Agitated saline contrast bubble study was negative, with no evidence of any interatrial shunt. FINDINGS  Left Ventricle: Left ventricular ejection fraction, by estimation, is 55 to 60%. The left ventricle has normal function. The left ventricle has no regional wall motion abnormalities. The left ventricular internal cavity size was normal in size. There is  moderate  concentric left ventricular hypertrophy. Left ventricular  diastolic parameters are consistent with Grade I diastolic dysfunction (impaired relaxation). Elevated left atrial pressure. Right Ventricle: The right ventricular size is normal. Right vetricular wall thickness was not well visualized. Right ventricular systolic function is normal. Tricuspid regurgitation signal is inadequate for assessing PA pressure. Left Atrium: Left atrial size was moderately dilated. Right Atrium: Right atrial size was normal in size. Pericardium: There is no evidence of pericardial effusion. Mitral Valve: The mitral valve is degenerative in appearance. Moderate to severe mitral annular calcification. No evidence of mitral valve regurgitation. Mild mitral valve stenosis. MV peak gradient, 9.0 mmHg. The mean mitral valve gradient is 4.0 mmHg with average heart rate of 82 bpm. Tricuspid Valve: The tricuspid valve is not well visualized. Aortic Valve: The aortic valve was not well visualized. Aortic valve regurgitation is not visualized. No aortic stenosis is present. Aortic valve mean gradient measures 3.0 mmHg. Aortic valve peak gradient measures 7.0 mmHg. Aortic valve area, by VTI measures 2.38 cm. Pulmonic Valve: The pulmonic valve was not well visualized. Aorta: The aortic root is normal in size and structure. Venous: The inferior vena cava is normal in size with greater than 50% respiratory variability, suggesting right atrial pressure of 3 mmHg. IAS/Shunts: No atrial level shunt detected by color flow Doppler. Agitated saline contrast was given intravenously to evaluate for intracardiac shunting. Agitated saline contrast bubble study was negative, with no evidence of any interatrial shunt.  LEFT VENTRICLE PLAX 2D LVIDd:         4.60 cm   Diastology LVIDs:         3.40 cm   LV e' medial:    3.70 cm/s LV PW:         1.30 cm   LV E/e' medial:  20.9 LV IVS:        1.50 cm   LV e' lateral:   3.92 cm/s LVOT diam:     2.00 cm   LV E/e' lateral: 19.7 LV SV:         52 LV SV Index:   24 LVOT Area:      3.14 cm  RIGHT VENTRICLE             IVC RV Basal diam:  3.10 cm     IVC diam: 0.90 cm RV S prime:     17.30 cm/s LEFT ATRIUM             Index        RIGHT ATRIUM           Index LA diam:        4.50 cm 2.04 cm/m   RA Area:     13.80 cm LA Vol (A2C):   61.5 ml 27.89 ml/m  RA Volume:   30.00 ml  13.60 ml/m LA Vol (A4C):   83.0 ml 37.64 ml/m LA Biplane Vol: 73.8 ml 33.46 ml/m  AORTIC VALVE AV Area (Vmax):    1.95 cm AV Area (Vmean):   2.15 cm AV Area (VTI):     2.38 cm AV Vmax:           132.00 cm/s AV Vmean:          77.900 cm/s AV VTI:            0.220 m AV Peak Grad:      7.0 mmHg AV Mean Grad:      3.0 mmHg LVOT Vmax:  81.90 cm/s LVOT Vmean:        53.300 cm/s LVOT VTI:          0.167 m LVOT/AV VTI ratio: 0.76  AORTA Ao Root diam: 3.20 cm Ao Asc diam:  2.90 cm MITRAL VALVE MV Area (PHT): 4.36 cm     SHUNTS MV Area VTI:   1.59 cm     Systemic VTI:  0.17 m MV Peak grad:  9.0 mmHg     Systemic Diam: 2.00 cm MV Mean grad:  4.0 mmHg MV Vmax:       1.50 m/s MV Vmean:      91.6 cm/s MV Decel Time: 174 msec MV E velocity: 77.30 cm/s MV A velocity: 142.00 cm/s MV E/A ratio:  0.54 Mihai Croitoru MD Electronically signed by Thurmon Fair MD Signature Date/Time: 11/08/2023/3:28:58 PM    Final    CT ANGIO HEAD NECK W WO CM Result Date: 11/07/2023 CLINICAL DATA:  Stroke/TIA, determine embolic source EXAM: CT ANGIOGRAPHY HEAD AND NECK WITH AND WITHOUT CONTRAST TECHNIQUE: Multidetector CT imaging of the head and neck was performed using the standard protocol during bolus administration of intravenous contrast. Multiplanar CT image reconstructions and MIPs were obtained to evaluate the vascular anatomy. Carotid stenosis measurements (when applicable) are obtained utilizing NASCET criteria, using the distal internal carotid diameter as the denominator. RADIATION DOSE REDUCTION: This exam was performed according to the departmental dose-optimization program which includes automated exposure control,  adjustment of the mA and/or kV according to patient size and/or use of iterative reconstruction technique. CONTRAST:  60mL OMNIPAQUE IOHEXOL 350 MG/ML SOLN COMPARISON:  CT head from earlier today. FINDINGS: CTA NECK FINDINGS Aortic arch: Aortic atherosclerosis. Great vessel origins are patent. Motion limits evaluation for stenosis with probable moderate stenosis of the left proximal subclavian artery. Right carotid system: Atherosclerosis at the carotid bifurcation without greater than 50% stenosis. Left carotid system: Extensive atherosclerosis of the common carotid artery with multifocal moderate to severe stenosis. Critically stenotic and nearly occlusive left ICA origin with only a tiny stream of contrast visible. More distal ICA in the neck is small, but remains patent. Vertebral arteries: Right dominant. Severe stenosis of the right vertebral artery origin. Left vertebral artery is patent without definite high-grade stenosis although evaluation at the origin is limited due to motion. Skeleton: No acute abnormality on limited assessment. Other neck: No acute abnormality on limited assessment. Upper chest: Visualized lung apices are clear. Review of the MIP images confirms the above findings CTA HEAD FINDINGS Anterior circulation: Small left ICA. Severe bilateral paraclinoid ICA stenosis. Bilateral MCAs are patent without proximal high-grade stenosis. 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 Right A1 ACA and bilateral A2 ACAs are patent proximally. Limited distal evaluation due to motion. Posterior circulation: Bilateral intradural vertebral arteries, basilar artery and bilateral posterior cerebral arteries are patent without proximal high-grade stenosis. Atherosclerotic irregularity of the PCAs bilaterally. Venous sinuses: As permitted by contrast timing, patent. Review of the MIP images confirms the above findings IMPRESSION: 1. Critically stenotic and nearly occlusive left ICA origin with only  a tiny stream of contrast visible. More distal ICA is small, but remains patent. 2. Severe bilateral paraclinoid ICA stenosis. 3. Severe right vertebral artery origin stenosis. 4. Extensive atherosclerosis of the left common carotid artery with multifocal moderate to severe stenosis. 5. Probable moderate stenosis of the left subclavian artery, although motion limits assessment. 6.  Aortic Atherosclerosis (ICD10-I70.0). Findings discussed with Dr. Estell Harpin via telephone at 11:04 PM. Electronically Signed   By:  Feliberto Harts M.D.   On: 11/07/2023 23:06   CT Head Wo Contrast Result Date: 11/07/2023 CLINICAL DATA:  Altered mental status, aphasia, memory loss, slurred speech EXAM: CT HEAD WITHOUT CONTRAST TECHNIQUE: Contiguous axial images were obtained from the base of the skull through the vertex without intravenous contrast. RADIATION DOSE REDUCTION: This exam was performed according to the departmental dose-optimization program which includes automated exposure control, adjustment of the mA and/or kV according to patient size and/or use of iterative reconstruction technique. COMPARISON:  None Available. FINDINGS: Brain: Hypoattenuation within the superior left frontal lobe near the vertex (circa series 2/image 25 and 4/31) concerning for acute or subacute infarcts. Additional foci of hypoattenuation in the left basal ganglia (2/17 and 2/15). Vascular: No hyperdense vessel or unexpected calcification. Skull: Normal. Negative for fracture or focal lesion. Sinuses/Orbits: Moderate opacification of the mastoid air cells bilaterally. The paranasal sinuses are well aerated. Other: None. IMPRESSION: Multiple foci of hypoattenuation and left frontal lobe and basal ganglia concerning for acute or subacute infarcts. Critical Value/emergent results were called by telephone at the time of interpretation on 11/07/2023 at 9:35 pm to provider [facility] ZAMMIT , who verbally acknowledged these results. Electronically Signed   By:  Minerva Fester M.D.   On: 11/07/2023 21:38      Subjective:  No significant events overnight, she denies any complaints Discharge Exam: Vitals:   11/10/23 1215 11/10/23 1246  BP: (!) 159/86 104/76  Pulse: 69   Resp: 19 18  Temp:  98 F (36.7 C)  SpO2: 96% 97%   Vitals:   11/10/23 1200 11/10/23 1215 11/10/23 1240 11/10/23 1246  BP: (!) 183/109 (!) 159/86  104/76  Pulse: 68 69    Resp: (!) 21 19  18   Temp:    98 F (36.7 C)  TempSrc:    Oral  SpO2: 96% 96%  97%  Weight:   110.8 kg   Height:   5\' 6"  (1.676 m)     General: Pt is alert, awake, not in acute distress Cardiovascular: RRR, S1/S2 +, no rubs, no gallops Respiratory: CTA bilaterally, no wheezing, no rhonchi Abdominal: Soft, NT, ND, bowel sounds + Extremities: no edema, no cyanosis    The results of significant diagnostics from this hospitalization (including imaging, microbiology, ancillary and laboratory) are listed below for reference.     Microbiology: No results found for this or any previous visit (from the past 240 hours).   Labs: BNP (last 3 results) No results for input(s): "BNP" in the last 8760 hours. Basic Metabolic Panel: Recent Labs  Lab 11/07/23 2053 11/08/23 0820 11/09/23 0501 11/10/23 0457  NA 131* 136 137 134*  K 4.2 5.0 4.4 4.5  CL 102 107 108 107  CO2 18* 21* 20* 19*  GLUCOSE 155* 192* 153* 179*  BUN 35* 29* 24* 26*  CREATININE 2.15* 1.97* 1.81* 1.90*  CALCIUM 8.8* 8.7* 8.8* 8.5*  MG  --  1.9 2.0  --    Liver Function Tests: Recent Labs  Lab 11/07/23 2053  AST 18  ALT 25  ALKPHOS 96  BILITOT 0.8  PROT 7.4  ALBUMIN 3.5   No results for input(s): "LIPASE", "AMYLASE" in the last 168 hours. No results for input(s): "AMMONIA" in the last 168 hours. CBC: Recent Labs  Lab 11/07/23 2053 11/08/23 0820 11/10/23 0457  WBC 9.1 7.4 7.7  NEUTROABS 6.7 5.9  --   HGB 15.9* 15.1* 14.9  HCT 46.9* 44.6 44.4  MCV 91.8 91.6 92.1  PLT 179  158 166   Cardiac Enzymes: No  results for input(s): "CKTOTAL", "CKMB", "CKMBINDEX", "TROPONINI" in the last 168 hours. BNP: Invalid input(s): "POCBNP" CBG: Recent Labs  Lab 11/09/23 1230 11/09/23 1656 11/09/23 2119 11/10/23 0853 11/10/23 1325  GLUCAP 174* 105* 169* 185* 129*   D-Dimer No results for input(s): "DDIMER" in the last 72 hours. Hgb A1c Recent Labs    11/08/23 0455 11/08/23 0820  HGBA1C 9.0* 9.1*   Lipid Profile Recent Labs    11/08/23 0455  CHOL 155  HDL 26*  LDLCALC 100*  TRIG 145  CHOLHDL 6.0   Thyroid function studies No results for input(s): "TSH", "T4TOTAL", "T3FREE", "THYROIDAB" in the last 72 hours.  Invalid input(s): "FREET3" Anemia work up No results for input(s): "VITAMINB12", "FOLATE", "FERRITIN", "TIBC", "IRON", "RETICCTPCT" in the last 72 hours. Urinalysis    Component Value Date/Time   COLORURINE YELLOW 05/27/2017 0831   APPEARANCEUR CLEAR 05/27/2017 0831   LABSPEC 1.010 05/27/2017 0831   PHURINE 6.0 05/27/2017 0831   GLUCOSEU NEGATIVE 05/27/2017 0831   HGBUR 2+ (A) 05/27/2017 0831   BILIRUBINUR NEGATIVE 03/04/2017 1041   KETONESUR NEGATIVE 05/27/2017 0831   PROTEINUR NEGATIVE 05/27/2017 0831   NITRITE NEGATIVE 05/27/2017 0831   LEUKOCYTESUR 2+ (A) 05/27/2017 0831   Sepsis Labs Recent Labs  Lab 11/07/23 2053 11/08/23 0820 11/10/23 0457  WBC 9.1 7.4 7.7   Microbiology No results found for this or any previous visit (from the past 240 hours).   Time coordinating discharge: Over 30 minutes  SIGNED:   Huey Bienenstock, MD  [facility] 11/10/2023, 4:39 PM Pager   If 7PM-7AM, please contact night-coverage [email or web address] Password [email or web address]

## 2023-11-10 NOTE — Progress Notes (Signed)
 PROGRESS NOTE                                                                                                                                                                                                             Patient Demographics:    Erica Graham, is a 58 y.o. female, DOB - 1966-03-01, ZOX:[id number]  Outpatient Primary MD for the patient is Pcp, No    LOS - 1  Admit date - 11/07/2023    Chief Complaint  Patient presents with   Aphasia   Altered Mental Status       Brief Narrative (HPI from H&P)   58 y.o. Caucasian female with medical history significant for anxiety, arthritis, depression, fibromyalgia, GERD, hypertension, dyslipidemia and type 2 diabetes mellitus, who presented to the emergency room with acute onset of altered mental status, staring into space and expressive dysphasia with inability to communicate since yesterday.  This started around 1:40 PM on Saturday.  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  She was admitted to [facility] and transferred to [facility] for further care.   Subjective:   No significant events overnight, she was seen early morning before her procedure, reports she has been compliant with n.p.o., denies any focal deficits.   Assessment  & Plan :    Acute CVA (cerebrovascular accident) (HCC) affecting left frontal lobe and basal ganglia.  - Seen by neurology, symptoms mostly dysarthria, signed window of thrombectomy or TNA, full stroke workup, currently on DAPT, has been loaded with Plavix 300 mg x 1 as well.   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   -Carotid ultrasound concerning for left ICA occlusion, left ACA retrograde flow, plan for carotid angiogram today  by vascular surgery given discrepancy in ultrasound and CT angio to decide if intervention is required.  Acute kidney injury superimposed on chronic kidney disease stage IIIa ?Marland Kitchen  Baseline creatinine around 1.3 but this is in 2018, she could have progressed her CKD further and this could be close to her baseline.  Some element of dehydration, also received IV dye for CTA, continue gentle hydration, will give few doses of Mucomyst,  continue to monitor closely.  Essential hypertension  - We will allow permissive hypertension. We will hold off Cozaar.  Morbid Obesity with BMI of 40.  Follow with PCP for weight loss.    Controlled type 2 diabetes mellitus without complication, without long-term current use of insulin (HCC)  - The patient will be placed on supplemental coverage with NovoLog.   Lab Results  Component Value Date   HGBA1C 9.1 (H) 11/08/2023   CBG (last 3)  Recent Labs    11/09/23 1230 11/09/23 1656 11/09/23 2119  GLUCAP 174* 105* 169*   Lab Results  Component Value Date   CHOL 155 11/08/2023   HDL 26 (L) 11/08/2023   LDLCALC 100 (H) 11/08/2023   TRIG 145 11/08/2023   CHOLHDL 6.0 11/08/2023        Condition -  Guarded  Family Communication  : Discussed with patient, family member was sleeping a chair next to her bed.   Code Status :  Full  Consults  :  VVS, Neuro  PUD Prophylaxis :    Procedures  :     MRI -  Multifocal acute infarcts within the left frontal lobe and left basal ganglia, predominantly ACA territory. No hemorrhage.  Vas.Carotids - Right Carotid: Velocities in the right ICA are consistent with a 1-39% stenosis.   Non-hemodynamically significant plaque <50% noted in the CCA. The ECA appears <50% stenosed. Left Carotid: Velocities in the left ICA are consistent with a 80-99% stenosis.               Occlusion in left carotid bulb. ECA flows retrograde into bulb, providing flow to ICA. Bidirectional flow seen in proximal ICA.  TTE - 1. Left  ventricular ejection fraction, by estimation, is 55 to 60%. The left ventricle has normal function. The left ventricle has no regional wall motion abnormalities. There is moderate concentric left ventricular hypertrophy. Left ventricular diastolic parameters are consistent with Grade I diastolic dysfunction (impaired relaxation). Elevated left atrial pressure.  2. Right ventricular systolic function is normal. The right ventricular size is normal. Tricuspid regurgitation signal is inadequate for assessing PA pressure.  3. Left atrial size was moderately dilated.  4. The mitral valve is degenerative. No evidence of mitral valve regurgitation. Mild mitral stenosis. The mean mitral valve gradient is 4.0 mmHg with average heart rate of 82 bpm. Moderate to severe mitral annular calcification.  5. The aortic valve was not well visualized. Aortic valve regurgitation is not visualized. No aortic stenosis is present.  6. The inferior vena cava is normal in size with greater than 50% respiratory variability, suggesting right atrial pressure of 3 mmHg.  7. Agitated saline contrast bubble study was negative, with no evidence of any interatrial shunt.  CTA - 1. Critically stenotic and nearly occlusive left ICA origin with only a tiny stream of contrast visible. More distal ICA is small, but remains patent. 2. Severe bilateral paraclinoid ICA stenosis. 3. Severe right vertebral artery origin stenosis. 4. Extensive atherosclerosis of the left common carotid artery with multifocal moderate to severe stenosis. 5. Probable moderate stenosis of the left subclavian artery, although motion limits assessment. 6.  Aortic Atherosclerosis (ICD10-I70.0).  CT -   Multiple foci of hypoattenuation and left frontal lobe and basal ganglia concerning for acute or subacute infarcts.       Disposition Plan  :    Status is: Observation   DVT Prophylaxis  :    enoxaparin (LOVENOX) injection 40 mg Start: 11/08/23 1000  Lab Results   Component Value Date   PLT 158 11/08/2023    Diet :  Diet Order             Diet NPO time specified  Diet effective midnight                    Inpatient Medications  Scheduled Meds:  [MAR Hold]  stroke: early stages of recovery book   Does not apply Once   [facility] Hold] acetylcysteine  600 mg Oral BID   [MAR Hold] aspirin EC  81 mg Oral Daily   [MAR Hold] cholecalciferol  5,000 Units Oral Daily   [MAR Hold] clopidogrel  75 mg Oral Daily   [MAR Hold] enoxaparin (LOVENOX) injection  40 mg Subcutaneous Q24H   [MAR Hold] insulin aspart  0-5 Units Subcutaneous QHS   [MAR Hold] insulin aspart  0-6 Units Subcutaneous TID WC   [MAR Hold] nicotine  21 mg Transdermal Daily   [MAR Hold] rosuvastatin  20 mg Oral Daily   Continuous Infusions:  sodium chloride 50 mL/hr at 11/09/23 1659   PRN Meds:.[MAR Hold] acetaminophen **OR** [MAR Hold] acetaminophen (TYLENOL) oral liquid 160 mg/5 mL **OR** [MAR Hold] acetaminophen, [MAR Hold] albuterol, fentaNYL, Heparin (Porcine) in NaCl, heparin sodium (porcine), labetalol, lidocaine (PF), [MAR Hold] metoprolol tartrate, midazolam, [MAR Hold] ondansetron (ZOFRAN) IV, [MAR Hold] polyvinyl alcohol, [MAR Hold] senna-docusate, [MAR Hold] traZODone  Antibiotics  :    Anti-infectives (From admission, onward)    None         Objective:   Vitals:   11/10/23 0000 11/10/23 0023 11/10/23 0428 11/10/23 0750  BP:  (!) 168/91 (!) 167/102   Pulse: 79 66    Resp: 18 16    Temp:  98.4 F (36.9 C) 98 F (36.7 C)   TempSrc:  Oral Oral   SpO2: 97% 93%  96%  Weight:      Height:        Wt Readings from Last 3 Encounters:  11/08/23 114.2 kg  07/15/17 110.2 kg  06/03/17 108.4 kg     Intake/Output Summary (Last 24 hours) at 11/10/2023 0848 Last data filed at 11/10/2023 0622 Gross per 24 hour  Intake 669.16 ml  Output --  Net 669.16 ml     Physical Exam  Awake Alert, Oriented X 3, dysarthria much improved Symmetrical Chest wall movement,  Good air movement bilaterally, CTAB RRR,No Gallops,Rubs or new Murmurs, No Parasternal Heave +ve B.Sounds, Abd Soft, No tenderness, No rebound - guarding or rigidity. No Cyanosis, Clubbing or edema, No new Rash or bruise       Data Review:    Recent Labs  Lab 11/07/23 2053 11/08/23 0820  WBC 9.1 7.4  HGB 15.9* 15.1*  HCT 46.9* 44.6  PLT 179 158  MCV 91.8 91.6  MCH 31.1 31.0  MCHC 33.9 33.9  RDW 13.4 13.5  LYMPHSABS 1.6 0.9  MONOABS 0.6 0.5  EOSABS 0.0 0.0  BASOSABS 0.1 0.0    Recent Labs  Lab 11/07/23 2053 11/08/23 0455 11/08/23 0820 11/09/23 0501 11/10/23 0457  NA 131*  --  136 137 134*  K 4.2  --  5.0 4.4 4.5  CL 102  --  107 108 107  CO2 18*  --  21* 20* 19*  ANIONGAP 11  --  8 9 8   GLUCOSE 155*  --  192* 153* 179*  BUN 35*  --  29* 24* 26*  CREATININE 2.15*  --  1.97* 1.81* 1.90*  AST 18  --   --   --   --   ALT 25  --   --   --   --   ALKPHOS 96  --   --   --   --   BILITOT 0.8  --   --   --   --   ALBUMIN 3.5  --   --   --   --   INR 1.0  --   --   --   --   HGBA1C  --    < > 9.1*  --   --   MG  --   --  1.9 2.0  --   CALCIUM 8.8*  --  8.7* 8.8* 8.5*   < > = values in this interval not displayed.      Recent Labs  Lab 11/07/23 2053 11/08/23 0455 11/08/23 0820 11/09/23 0501 11/10/23 0457  INR 1.0  --   --   --   --   HGBA1C  --    < > 9.1*  --   --   MG  --   --  1.9 2.0  --   CALCIUM 8.8*  --  8.7* 8.8* 8.5*   < > = values in this interval not displayed.    --------------------------------------------------------------------------------------------------------------- Lab Results  Component Value Date   CHOL 155 11/08/2023   HDL 26 (L) 11/08/2023   LDLCALC 100 (H) 11/08/2023   TRIG 145 11/08/2023   CHOLHDL 6.0 11/08/2023    Lab Results  Component Value Date   HGBA1C 9.1 (H) 11/08/2023    Radiology Report VAS US CAROTID Result Date: 11/09/2023 Carotid Arterial Duplex Study Patient Name:  KAISLYN GULAS [facility]  Date of Exam:    11/08/2023 Medical Rec #: [id number]         Accession #:    [id number] Date of Birth: 23-Mar-1966        Patient Gender: F Patient Age:   25 years Exam Location:  [facility] Procedure:      VAS US CAROTID Referring Phys: Graceann Congress --------------------------------------------------------------------------------  Indications:       Carotid artery disease Risk Factors:      Hypertension, hyperlipidemia, Diabetes, current smoker, prior                    CVA. Comparison Study:  No previous carotid duplex exams. CTA on 11/07/2023 showed                    critical stenosis of left ICA Performing Technologist: Ernestene Mention RVT, RDMS  Examination Guidelines: A complete evaluation includes B-mode imaging, spectral Doppler, color Doppler, and power Doppler as needed of all accessible portions of each vessel. Bilateral testing is considered an integral part of a complete examination. Limited examinations for reoccurring indications may be performed as noted.  Right Carotid Findings: +----------+--------+--------+--------+-------------------------------+--------+           PSV cm/sEDV cm/sStenosisPlaque Description             Comments +----------+--------+--------+--------+-------------------------------+--------+ CCA Prox  117     21              heterogenous and calcific               +----------+--------+--------+--------+-------------------------------+--------+ CCA Distal107     25              calcific, hypoechoic and  heterogenous                            +----------+--------+--------+--------+-------------------------------+--------+ ICA Prox  73      28                                                      +----------+--------+--------+--------+-------------------------------+--------+ ICA Distal53      18                                                       +----------+--------+--------+--------+-------------------------------+--------+ ECA       100     10                                                      +----------+--------+--------+--------+-------------------------------+--------+ +----------+--------+-------+----------------+-------------------+           PSV cm/sEDV cmsDescribe        Arm Pressure (mmHG) +----------+--------+-------+----------------+-------------------+ Subclavian101            Multiphasic, WNL                    +----------+--------+-------+----------------+-------------------+ +---------+--------+--+--------+--+---------+ VertebralPSV cm/s29EDV cm/s11Antegrade +---------+--------+--+--------+--+---------+  Left Carotid Findings: +----------+--------+--------+--------+------------------------+---------------+           PSV cm/sEDV cm/sStenosisPlaque Description      Comments        +----------+--------+--------+--------+------------------------+---------------+ CCA Prox  18      0               calcific and hyperechoic                +----------+--------+--------+--------+------------------------+---------------+ CCA Mid   23      0               calcific and                                                              heterogenous                            +----------+--------+--------+--------+------------------------+---------------+ CCA Distal25      5               calcific and                                                              heterogenous                            +----------+--------+--------+--------+------------------------+---------------+ ICA Prox  277  142     80-99%                                          +----------+--------+--------+--------+------------------------+---------------+ ICA Mid   46      18                                                       +----------+--------+--------+--------+------------------------+---------------+ ICA Distal44      22                                                      +----------+--------+--------+--------+------------------------+---------------+ ECA       68      36              calcific                retrograde flow +----------+--------+--------+--------+------------------------+---------------+ +----------+--------+--------+--------+-------------------+           PSV cm/sEDV cm/sDescribeArm Pressure (mmHG) +----------+--------+--------+--------+-------------------+ [id number]             Stenotic                    +----------+--------+--------+--------+-------------------+ +---------+--------+--+--------+--+---------+ VertebralPSV cm/s58EDV cm/s16Antegrade +---------+--------+--+--------+--+---------+ ECA flows into bulb then into ICA.  Summary: Right Carotid: Velocities in the right ICA are consistent with a 1-39% stenosis.                Non-hemodynamically significant plaque <50% noted in the CCA. The                ECA appears <50% stenosed. Left Carotid: Velocities in the left ICA are consistent with a 80-99% stenosis.               Occlusion in left carotid bulb. ECA flows retrograde into bulb,               providing flow to ICA. Bidirectional flow seen in proximal ICA.  *See table(s) above for measurements and observations.  Electronically signed by Lemar Livings MD on 11/09/2023 at 4:44:13 PM.    Final    MR BRAIN WO CONTRAST Result Date: 11/08/2023 CLINICAL DATA:  Acute neurologic deficit EXAM: MRI HEAD WITHOUT CONTRAST TECHNIQUE: Multiplanar, multiecho pulse sequences of the brain and surrounding structures were obtained without intravenous contrast. COMPARISON:  None Available. FINDINGS: Brain: Multifocal abnormal diffusion restriction within the left frontal lobe and left basal ganglia. No acute or chronic hemorrhage. Cytotoxic edema within the anterior left frontal lobe.  No hydrocephalus. The midline structures are normal. Vascular: Normal flow voids. Skull and upper cervical spine: Normal calvarium and skull base. Visualized upper cervical spine and soft tissues are normal. Sinuses/Orbits:Mastoid effusions. Paranasal sinuses are clear. Normal orbits. IMPRESSION: Multifocal acute infarcts within the left frontal lobe and left basal ganglia, predominantly ACA territory. No hemorrhage. Electronically Signed   By: Deatra Robinson M.D.   On: 11/08/2023 19:49   ECHOCARDIOGRAM COMPLETE BUBBLE STUDY Result Date: 11/08/2023    ECHOCARDIOGRAM REPORT   Patient Name:   HIRAL LUKASIEWICZ Date of Exam: 11/08/2023 Medical Rec #:  [id number]  Height:       66.0 in Accession #:    [id number]       Weight:       251.8 lb Date of Birth:  February 06, 1966       BSA:          2.205 m Patient Age:    57 years         BP:           160/80 mmHg Patient Gender: F                HR:           76 bpm. Exam Location:  Inpatient Procedure: 2D Echo, Cardiac Doppler, Color Doppler and Saline Contrast Bubble            Study (Both Spectral and Color Flow Doppler were utilized during            procedure). Indications:    Stroke  History:        Patient has no prior history of Echocardiogram examinations.                 Risk Factors:Hypertension, Diabetes, Dyslipidemia and Current                 Smoker.  Sonographer:    Karma Ganja Referring Phys: [id number] JAN A MANSY  Sonographer Comments: Technically challenging study due to limited acoustic windows and patient is obese. Image acquisition challenging due to uncooperative patient. IMPRESSIONS  1. Left ventricular ejection fraction, by estimation, is 55 to 60%. The left ventricle has normal function. The left ventricle has no regional wall motion abnormalities. There is moderate concentric left ventricular hypertrophy. Left ventricular diastolic parameters are consistent with Grade I diastolic dysfunction (impaired relaxation). Elevated left atrial pressure.  2.  Right ventricular systolic function is normal. The right ventricular size is normal. Tricuspid regurgitation signal is inadequate for assessing PA pressure.  3. Left atrial size was moderately dilated.  4. The mitral valve is degenerative. No evidence of mitral valve regurgitation. Mild mitral stenosis. The mean mitral valve gradient is 4.0 mmHg with average heart rate of 82 bpm. Moderate to severe mitral annular calcification.  5. The aortic valve was not well visualized. Aortic valve regurgitation is not visualized. No aortic stenosis is present.  6. The inferior vena cava is normal in size with greater than 50% respiratory variability, suggesting right atrial pressure of 3 mmHg.  7. Agitated saline contrast bubble study was negative, with no evidence of any interatrial shunt. FINDINGS  Left Ventricle: Left ventricular ejection fraction, by estimation, is 55 to 60%. The left ventricle has normal function. The left ventricle has no regional wall motion abnormalities. The left ventricular internal cavity size was normal in size. There is  moderate concentric left ventricular hypertrophy. Left ventricular diastolic parameters are consistent with Grade I diastolic dysfunction (impaired relaxation). Elevated left atrial pressure. Right Ventricle: The right ventricular size is normal. Right vetricular wall thickness was not well visualized. Right ventricular systolic function is normal. Tricuspid regurgitation signal is inadequate for assessing PA pressure. Left Atrium: Left atrial size was moderately dilated. Right Atrium: Right atrial size was normal in size. Pericardium: There is no evidence of pericardial effusion. Mitral Valve: The mitral valve is degenerative in appearance. Moderate to severe mitral annular calcification. No evidence of mitral valve regurgitation. Mild mitral valve stenosis. MV peak gradient, 9.0 mmHg. The mean mitral valve gradient is 4.0 mmHg with average heart rate of 82  bpm. Tricuspid Valve:  The tricuspid valve is not well visualized. Aortic Valve: The aortic valve was not well visualized. Aortic valve regurgitation is not visualized. No aortic stenosis is present. Aortic valve mean gradient measures 3.0 mmHg. Aortic valve peak gradient measures 7.0 mmHg. Aortic valve area, by VTI measures 2.38 cm. Pulmonic Valve: The pulmonic valve was not well visualized. Aorta: The aortic root is normal in size and structure. Venous: The inferior vena cava is normal in size with greater than 50% respiratory variability, suggesting right atrial pressure of 3 mmHg. IAS/Shunts: No atrial level shunt detected by color flow Doppler. Agitated saline contrast was given intravenously to evaluate for intracardiac shunting. Agitated saline contrast bubble study was negative, with no evidence of any interatrial shunt.  LEFT VENTRICLE PLAX 2D LVIDd:         4.60 cm   Diastology LVIDs:         3.40 cm   LV e' medial:    3.70 cm/s LV PW:         1.30 cm   LV E/e' medial:  20.9 LV IVS:        1.50 cm   LV e' lateral:   3.92 cm/s LVOT diam:     2.00 cm   LV E/e' lateral: 19.7 LV SV:         52 LV SV Index:   24 LVOT Area:     3.14 cm  RIGHT VENTRICLE             IVC RV Basal diam:  3.10 cm     IVC diam: 0.90 cm RV S prime:     17.30 cm/s LEFT ATRIUM             Index        RIGHT ATRIUM           Index LA diam:        4.50 cm 2.04 cm/m   RA Area:     13.80 cm LA Vol (A2C):   61.5 ml 27.89 ml/m  RA Volume:   30.00 ml  13.60 ml/m LA Vol (A4C):   83.0 ml 37.64 ml/m LA Biplane Vol: 73.8 ml 33.46 ml/m  AORTIC VALVE AV Area (Vmax):    1.95 cm AV Area (Vmean):   2.15 cm AV Area (VTI):     2.38 cm AV Vmax:           132.00 cm/s AV Vmean:          77.900 cm/s AV VTI:            0.220 m AV Peak Grad:      7.0 mmHg AV Mean Grad:      3.0 mmHg LVOT Vmax:         81.90 cm/s LVOT Vmean:        53.300 cm/s LVOT VTI:          0.167 m LVOT/AV VTI ratio: 0.76  AORTA Ao Root diam: 3.20 cm Ao Asc diam:  2.90 cm MITRAL VALVE MV Area (PHT):  4.36 cm     SHUNTS MV Area VTI:   1.59 cm     Systemic VTI:  0.17 m MV Peak grad:  9.0 mmHg     Systemic Diam: 2.00 cm MV Mean grad:  4.0 mmHg MV Vmax:       1.50 m/s MV Vmean:      91.6 cm/s MV Decel Time: 174 msec MV E velocity: 77.30 cm/s MV  A velocity: 142.00 cm/s MV E/A ratio:  0.54 Mihai Croitoru MD Electronically signed by Thurmon Fair MD Signature Date/Time: 11/08/2023/3:28:58 PM    Final      Signature  -   Huey Bienenstock M.D on 11/10/2023 at 8:48 AM   -  To page go to [email or web address]

## 2023-11-11 ENCOUNTER — Other Ambulatory Visit (HOSPITAL_COMMUNITY): Payer: Self-pay

## 2023-11-11 ENCOUNTER — Telehealth (HOSPITAL_COMMUNITY): Payer: Self-pay

## 2023-11-11 ENCOUNTER — Encounter (HOSPITAL_COMMUNITY): Payer: Self-pay | Admitting: Vascular Surgery

## 2023-11-11 NOTE — Telephone Encounter (Signed)
 Pharmacy Patient Advocate Encounter  Insurance verification completed.    The patient is insured through [facility].     Ran test claim for Tradjenta and the current 30 day co-pay is $4.00.   This test claim was processed through [facility]- copay amounts may vary at other pharmacies due to pharmacy/plan contracts, or as the patient moves through the different stages of their insurance plan.

## 2023-11-12 ENCOUNTER — Encounter (HOSPITAL_COMMUNITY): Payer: Self-pay | Admitting: Vascular Surgery

## 2023-11-12 ENCOUNTER — Other Ambulatory Visit: Payer: Self-pay

## 2023-11-12 NOTE — Progress Notes (Signed)
 SDW CALL  Patient was given pre-op instructions over the phone. The opportunity was given for the patient to ask questions. No further questions asked. Patient verbalized understanding of instructions given.   PCP - none Cardiologist - denies  PPM/ICD - denies Device Orders -  Rep Notified -   Chest x-ray - na EKG - 11/09/23 Stress Test - denies ECHO - 11/08/23 Cardiac Cath - denies  Sleep Study - na CPAP -   Fasting Blood Sugar - 135 Checks Blood Sugar one times a day  Blood Thinner Instructions:continue Plavix Aspirin Instructions:continue   ERAS Protcol -no PRE-SURGERY Ensure or G2-   COVID TEST- na    Anesthesia review: yes- hospitalized 3/31-11/10/23 for stroke  Patient denies shortness of breath, fever, cough and chest pain over the phone call    Surgical Instructions    Your procedure is scheduled on April 7.   Report to [facility] Main Entrance "A" at 0530 A.M., then check in with the Admitting office.  Call this number if you have problems the morning of surgery:  [phone]    Remember:  Do not eat or drink anything after midnight the night before your surgery   Take these medicines the morning of surgery with A SIP OF WATER: Amlodipine,Aspirin,Clopidogrel,Protonix,Crestor. PRN- Albuterol Inhaler-bring to the hospital  As of today, STOP taking any Aleve, Naproxen, Ibuprofen, Motrin, Advil, Goody's, BC's, all herbal medications, fish oil, and all vitamins.  WHAT DO I DO ABOUT MY DIABETES MEDICATION?  Do not take oral diabetes medicines (pills) the morning of surgery. DO NOT TAKE JARDIANCE AFTER 11/11/23. Patient reports she took Jardiance am of 11/12/23. Instructed her to hold until after surgery.  Check your blood sugar the morning of your surgery when you wake up and every 2 hours until you get to the Short Stay unit.  If your blood sugar is less than 70 mg/dL, you will need to treat for low blood sugar: Do not take insulin. Treat a low blood sugar  (less than 70 mg/dL) with  cup of clear juice (cranberry or apple), 4 glucose tablets, OR glucose gel. Recheck blood sugar in 15 minutes after treatment (to make sure it is greater than 70 mg/dL). If your blood sugar is not greater than 70 mg/dL on recheck, call [phone] for further instructions. Report your blood sugar to the short stay nurse when you get to Short Stay.  [facility] is not responsible for any belongings or valuables. .   Do NOT Smoke (Tobacco/Vaping)  24 hours prior to your procedure  If you use a CPAP at night, you may bring your mask for your overnight stay.   Contacts, glasses, hearing aids, dentures or partials may not be worn into surgery, please bring cases for these belongings   Patients discharged the day of surgery will not be allowed to drive home, and someone needs to stay with them for 24 hours.       Special instructions:    Oral Hygiene is also important to reduce your risk of infection.  Remember - BRUSH YOUR TEETH THE MORNING OF SURGERY WITH YOUR REGULAR TOOTHPASTE   Day of Surgery:  Take a shower the day of or night before with antibacterial soap. Wear Clean/Comfortable clothing the morning of surgery Do not apply any deodorants/lotions.   Do not wear jewelry or makeup Do not wear lotions, powders, perfumes/colognes, or deodorant. Do not shave 48 hours prior to surgery.  Men may shave face and neck. Do not bring valuables  to the hospital. Do not wear nail polish, gel polish, artificial nails, or any other type of covering on natural nails (fingers and toes) If you have artificial nails or gel coating that need to be removed by a nail salon, please have this removed prior to surgery. Artificial nails or gel coating may interfere with anesthesia's ability to adequately monitor your vital signs. Remember to brush your teeth WITH YOUR REGULAR TOOTHPASTE.

## 2023-11-12 NOTE — Anesthesia Preprocedure Evaluation (Addendum)
 Anesthesia Evaluation  Patient identified by MRN, date of birth, ID band Patient awake    Reviewed: Allergy & Precautions, H&P , NPO status , Patient's Chart, lab work & pertinent test results  Airway Mallampati: III  TM Distance: >3 FB Neck ROM: Full    Dental no notable dental hx. (+) Teeth Intact, Dental Advisory Given   Pulmonary neg pulmonary ROS, Current Smoker and Patient abstained from smoking.   Pulmonary exam normal breath sounds clear to auscultation       Cardiovascular Exercise Tolerance: Good hypertension, Pt. on medications + Peripheral Vascular Disease   Rhythm:Regular Rate:Normal     Neuro/Psych   Anxiety Depression    CVA, No Residual Symptoms    GI/Hepatic Neg liver ROS,GERD  Medicated,,  Endo/Other  diabetes, Type 2, Oral Hypoglycemic Agents  Class 3 obesity  Renal/GU Renal InsufficiencyRenal disease  negative genitourinary   Musculoskeletal  (+) Arthritis , Osteoarthritis,  Fibromyalgia -  Abdominal   Peds  Hematology negative hematology ROS (+)   Anesthesia Other Findings   Reproductive/Obstetrics negative OB ROS                             Anesthesia Physical Anesthesia Plan  ASA: 3  Anesthesia Plan: General   Post-op Pain Management: Tylenol PO (pre-op)*   Induction: Intravenous  PONV Risk Score and Plan: 3 and Ondansetron, Dexamethasone and Droperidol  Airway Management Planned: Oral ETT  Additional Equipment: Arterial line  Intra-op Plan:   Post-operative Plan: Extubation in OR  Informed Consent: I have reviewed the patients History and Physical, chart, labs and discussed the procedure including the risks, benefits and alternatives for the proposed anesthesia with the patient or authorized representative who has indicated his/her understanding and acceptance.     Dental advisory given  Plan Discussed with: CRNA  Anesthesia Plan Comments: (PAT  note written 11/12/2023 by Shonna Chock, PA-C.  )       Anesthesia Quick Evaluation

## 2023-11-12 NOTE — Progress Notes (Signed)
 Anesthesia Chart Review: SAME DAY WORK-UP   Case: [id number] Date/Time: 11/15/23 0715   Procedure: ENDARTERECTOMY, CAROTID (Left)   Anesthesia type: Choice   Diagnosis: Carotid artery stenosis with cerebral infarction (HCC) [I63.239]   Pre-op diagnosis: Symptomatic Left Carotid Artery Stenosis   [facility]: [facility] ROOM 12 / [facility] OR   Surgeons: Cephus Shelling, MD       DISCUSSION: Patient is a 58 year old female scheduled for the above procedure.  History includes smoking, asthma, HTN, HLD, fibromyalgia, DM2, varicose veins, RA, CVA (11/07/23), carotid artery stenosis.  Home health hospitalization 11/07/2023 - 11/10/2023 for acute CVA.  Transferred from [facility] to [facility] Carotid US concerning for left ICA occlusion with left ACA retrograde flow, and underwent carotid angiography which showed 50% stenosis at the ostia of the CCA, retrograde filling of the ECA, and anterograde filling of the ICA which was greater than 99% stenosis.  Left CEA plan for 11/15/2023.  Continued on DAPT (ASA and Plavix).  Baseline CKD, but possible AKI on CKD as Creatinine 1.3 in 2018, but ~ 1.8 - 2.0 during admission. She did receive IV dye for CTA. She was hydrated and given a few doses of Mucomyst. A1c 9.1% and started on Jardiance.   Echo 11/08/23 showed LVEF 55-60%, no regional wall motion abnormalities, moderate concentric LVH, grade 1 diastolic dysfunction, normal RV systolic function, moderately dilated LA, moderate to severe mitral annular calcification with mild MS, mean MV gradient of 4.0 mmHg with average HR of 82 bpm, agitated saline contrast bubble study was negative.  Most recent labs as noted below. Anesthesia team to evaluate on the day of surgery.    VS:  Wt Readings from Last 3 Encounters:  11/10/23 110.8 kg  07/15/17 110.2 kg  06/03/17 108.4 kg   BP Readings from Last 3 Encounters:  11/10/23 104/76  07/15/17 128/80  06/03/17 (!) 148/90   Pulse Readings from Last 3 Encounters:  11/10/23 69  07/15/17  76  06/03/17 76     PROVIDERS: Pcp, No   LABS: Most recent labs in [facility] include: Lab Results  Component Value Date   WBC 7.7 11/10/2023   HGB 14.9 11/10/2023   HCT 44.4 11/10/2023   PLT 166 11/10/2023   GLUCOSE 179 (H) 11/10/2023   CHOL 155 11/08/2023   TRIG 145 11/08/2023   HDL 26 (L) 11/08/2023   LDLCALC 100 (H) 11/08/2023   ALT 25 11/07/2023   AST 18 11/07/2023   NA 134 (L) 11/10/2023   K 4.5 11/10/2023   CL 107 11/10/2023   CREATININE 1.90 (H) 11/10/2023   BUN 26 (H) 11/10/2023   CO2 19 (L) 11/10/2023   INR 1.0 11/07/2023   HGBA1C 9.1 (H) 11/08/2023   Lab Results  Component Value Date   CREATININE 1.90 (H) 11/10/2023   CREATININE 1.81 (H) 11/09/2023   CREATININE 1.97 (H) 11/08/2023      IMAGES: Selective left carotid angiography 11/10/23: Findings:  Widely patent innominate continuing into the right subclavian, right common carotid artery.  External and internal carotid arteries without stenosis.   Slow filling of the left common carotid with mild disease throughout.  50% stenosis at the ostia of the common carotid artery. Severe atherosclerotic disease appreciated at the carotid bulb with a very small flow lumen appreciated.  Arch angiogram demonstrates retrograde filling of the external carotid artery, with antegrade filling of the internal carotid artery.  Selective angiography demonstrates patent internal carotid artery with greater than 99% stenosis.  Widely patent distally.  Left vertebral artery with aortic origin percent stenosis at the origin, otherwise widely patent, large artery. Left subclavian artery with atherosclerotic disease at the origin.  Unable to quantify level of stenosis.  Appears less than 50%.   Widely patent right external iliac artery, common femoral artery    MRI Brain 11/08/23: IMPRESSION: Multifocal acute infarcts within the left frontal lobe and left basal ganglia, predominantly ACA territory. No hemorrhage.     EKG:  11/09/23: Normal sinus rhythm Cannot rule out Anterior infarct , age undetermined Abnormal ECG When compared with ECG of 07-Nov-2023 20:47, PREVIOUS ECG IS PRESENT Confirmed by Armanda Magic [phone]) on 11/09/2023 8:39:11 PM   CV: Echo 11/08/23: IMPRESSIONS   1. Left ventricular ejection fraction, by estimation, is 55 to 60%. The  left ventricle has normal function. The left ventricle has no regional  wall motion abnormalities. There is moderate concentric left ventricular  hypertrophy. Left ventricular  diastolic parameters are consistent with Grade I diastolic dysfunction  (impaired relaxation). Elevated left atrial pressure.   2. Right ventricular systolic function is normal. The right ventricular  size is normal. Tricuspid regurgitation signal is inadequate for assessing  PA pressure.   3. Left atrial size was moderately dilated.   4. The mitral valve is degenerative. No evidence of mitral valve  regurgitation. Mild mitral stenosis. The mean mitral valve gradient is 4.0  mmHg with average heart rate of 82 bpm. Moderate to severe mitral annular  calcification.   5. The aortic valve was not well visualized. Aortic valve regurgitation  is not visualized. No aortic stenosis is present.   6. The inferior vena cava is normal in size with greater than 50%  respiratory variability, suggesting right atrial pressure of 3 mmHg.   7. Agitated saline contrast bubble study was negative, with no evidence  of any interatrial shunt.    US Carotid 11/08/23: Summary:  - Right Carotid: Velocities in the right ICA are consistent with a 1-39%  stenosis.                Non-hemodynamically significant plaque <50% noted in the  CCA. The                 ECA appears <50% stenosed.  - Left Carotid: Velocities in the left ICA are consistent with a 80-99%  stenosis.               Occlusion in left carotid bulb. ECA flows retrograde into  bulb,               providing flow to ICA. Bidirectional flow seen  in proximal  ICA.     Past Medical History:  Diagnosis Date   Acute CVA (cerebrovascular accident) (HCC) 11/07/2023   Anxiety 03/05/2016   Arthritis    rheumatoid   Asthma    Body aches 03/05/2016   Depression    Fibromyalgia    GERD (gastroesophageal reflux disease)    Hyperlipidemia    Hypertension    Obesity    Recent onset of diabetes mellitus (HCC) 03/10/2016   Referred to Dr Fransico Him and rx metformin 500 mg bid and zocor 20 mg 1 daily   Varicose vein of leg 03/05/2016   Vitiligo 03/05/2016    Past Surgical History:  Procedure Laterality Date   CAROTID ANGIOGRAPHY N/A 11/10/2023   Procedure: CAROTID ANGIOGRAPHY;  Surgeon: Victorino Sparrow, MD;  Location: [facility] INVASIVE CV LAB;  Service: Cardiovascular;  Laterality: N/A;   CHOLECYSTECTOMY  COLONOSCOPY N/A 08/20/2016   Procedure: COLONOSCOPY;  Surgeon: Malissa Hippo, MD;  Location: [facility] ENDO SUITE;  Service: Endoscopy;  Laterality: N/A;  830    DILATION AND CURETTAGE OF UTERUS     GALLBLADDER SURGERY     LEG SURGERY Left    broke leg   POLYPECTOMY  08/20/2016   Procedure: POLYPECTOMY;  Surgeon: Malissa Hippo, MD;  Location: [facility] SU[facility];  Service: Endoscopy;;  colon   TUBAL LIGATION      MEDICATIONS: No current facility-administered medications for this encounter.    albuterol (PROVENTIL HFA;VENTOLIN HFA) 108 (90 Base) MCG/ACT inhaler   amLODipine (NORVASC) 5 MG tablet   aspirin EC 81 MG tablet   Cholecalciferol (VITAMIN D3) 5000 units CAPS   clopidogrel (PLAVIX) 75 MG tablet   diphenhydrAMINE (BENADRYL) 25 MG tablet   empagliflozin (JARDIANCE) 10 MG TABS tablet   nicotine (NICODERM CQ - DOSED IN MG/24 HOURS) 21 mg/24hr patch   pantoprazole (PROTONIX) 40 MG tablet   Polyethyl Glycol-Propyl Glycol (SYSTANE OP)   rosuvastatin (CRESTOR) 20 MG tablet    Shonna Chock, PA-C Surgical Short Stay/Anesthesiology [facility] Phone [phone] [facility] Phone [phone] 11/12/2023 12:36 PM

## 2023-11-15 ENCOUNTER — Encounter (HOSPITAL_COMMUNITY): Payer: Self-pay | Admitting: Vascular Surgery

## 2023-11-15 ENCOUNTER — Encounter (HOSPITAL_COMMUNITY)
Admission: RE | Disposition: A | Discharge status: Home / Self Care | Payer: Self-pay | Source: Home / Self Care | Attending: Vascular Surgery

## 2023-11-15 ENCOUNTER — Other Ambulatory Visit: Payer: Self-pay

## 2023-11-15 ENCOUNTER — Inpatient Hospital Stay (HOSPITAL_COMMUNITY)
Admission: RE | Admit: 2023-11-15 | Discharge: 2023-11-16 | DRG: 038 | Disposition: A | Discharge status: Home / Self Care | Attending: Vascular Surgery | Admitting: Vascular Surgery

## 2023-11-15 ENCOUNTER — Inpatient Hospital Stay (HOSPITAL_COMMUNITY): Payer: Self-pay | Admitting: Vascular Surgery

## 2023-11-15 DIAGNOSIS — I639 Cerebral infarction, unspecified: Secondary | ICD-10-CM

## 2023-11-15 DIAGNOSIS — K219 Gastro-esophageal reflux disease without esophagitis: Secondary | ICD-10-CM | POA: Diagnosis present

## 2023-11-15 DIAGNOSIS — I3481 Nonrheumatic mitral (valve) annulus calcification: Secondary | ICD-10-CM | POA: Diagnosis not present

## 2023-11-15 DIAGNOSIS — M1990 Unspecified osteoarthritis, unspecified site: Secondary | ICD-10-CM | POA: Diagnosis not present

## 2023-11-15 DIAGNOSIS — Z6841 Body Mass Index (BMI) 40.0 and over, adult: Secondary | ICD-10-CM | POA: Diagnosis not present

## 2023-11-15 DIAGNOSIS — Z9049 Acquired absence of other specified parts of digestive tract: Secondary | ICD-10-CM

## 2023-11-15 DIAGNOSIS — I63239 Cerebral infarction due to unspecified occlusion or stenosis of unspecified carotid arteries: Secondary | ICD-10-CM

## 2023-11-15 DIAGNOSIS — J45909 Unspecified asthma, uncomplicated: Secondary | ICD-10-CM | POA: Diagnosis present

## 2023-11-15 DIAGNOSIS — E782 Mixed hyperlipidemia: Secondary | ICD-10-CM | POA: Diagnosis not present

## 2023-11-15 DIAGNOSIS — M797 Fibromyalgia: Secondary | ICD-10-CM | POA: Diagnosis present

## 2023-11-15 DIAGNOSIS — F32A Depression, unspecified: Secondary | ICD-10-CM | POA: Diagnosis not present

## 2023-11-15 DIAGNOSIS — I10 Essential (primary) hypertension: Secondary | ICD-10-CM

## 2023-11-15 DIAGNOSIS — E1151 Type 2 diabetes mellitus with diabetic peripheral angiopathy without gangrene: Secondary | ICD-10-CM | POA: Diagnosis present

## 2023-11-15 DIAGNOSIS — M19042 Primary osteoarthritis, left hand: Secondary | ICD-10-CM | POA: Diagnosis present

## 2023-11-15 DIAGNOSIS — F419 Anxiety disorder, unspecified: Secondary | ICD-10-CM | POA: Diagnosis not present

## 2023-11-15 DIAGNOSIS — Z9889 Other specified postprocedural states: Secondary | ICD-10-CM

## 2023-11-15 DIAGNOSIS — E66813 Obesity, class 3: Secondary | ICD-10-CM | POA: Diagnosis present

## 2023-11-15 DIAGNOSIS — Z7902 Long term (current) use of antithrombotics/antiplatelets: Secondary | ICD-10-CM

## 2023-11-15 DIAGNOSIS — E119 Type 2 diabetes mellitus without complications: Secondary | ICD-10-CM | POA: Diagnosis not present

## 2023-11-15 DIAGNOSIS — M069 Rheumatoid arthritis, unspecified: Secondary | ICD-10-CM | POA: Diagnosis not present

## 2023-11-15 DIAGNOSIS — Z8379 Family history of other diseases of the digestive system: Secondary | ICD-10-CM

## 2023-11-15 DIAGNOSIS — I129 Hypertensive chronic kidney disease with stage 1 through stage 4 chronic kidney disease, or unspecified chronic kidney disease: Secondary | ICD-10-CM | POA: Diagnosis not present

## 2023-11-15 DIAGNOSIS — E1122 Type 2 diabetes mellitus with diabetic chronic kidney disease: Secondary | ICD-10-CM | POA: Diagnosis not present

## 2023-11-15 DIAGNOSIS — N189 Chronic kidney disease, unspecified: Secondary | ICD-10-CM | POA: Diagnosis not present

## 2023-11-15 DIAGNOSIS — M19072 Primary osteoarthritis, left ankle and foot: Secondary | ICD-10-CM | POA: Diagnosis present

## 2023-11-15 DIAGNOSIS — Z7984 Long term (current) use of oral hypoglycemic drugs: Secondary | ICD-10-CM

## 2023-11-15 DIAGNOSIS — F17210 Nicotine dependence, cigarettes, uncomplicated: Secondary | ICD-10-CM | POA: Diagnosis present

## 2023-11-15 DIAGNOSIS — M51369 Other intervertebral disc degeneration, lumbar region without mention of lumbar back pain or lower extremity pain: Secondary | ICD-10-CM | POA: Diagnosis present

## 2023-11-15 DIAGNOSIS — Z79899 Other long term (current) drug therapy: Secondary | ICD-10-CM | POA: Diagnosis not present

## 2023-11-15 DIAGNOSIS — Z8042 Family history of malignant neoplasm of prostate: Secondary | ICD-10-CM

## 2023-11-15 DIAGNOSIS — Z8249 Family history of ischemic heart disease and other diseases of the circulatory system: Secondary | ICD-10-CM

## 2023-11-15 DIAGNOSIS — Z825 Family history of asthma and other chronic lower respiratory diseases: Secondary | ICD-10-CM

## 2023-11-15 DIAGNOSIS — I8390 Asymptomatic varicose veins of unspecified lower extremity: Secondary | ICD-10-CM | POA: Diagnosis present

## 2023-11-15 DIAGNOSIS — Z833 Family history of diabetes mellitus: Secondary | ICD-10-CM

## 2023-11-15 DIAGNOSIS — R4781 Slurred speech: Secondary | ICD-10-CM | POA: Diagnosis not present

## 2023-11-15 DIAGNOSIS — Z888 Allergy status to other drugs, medicaments and biological substances status: Secondary | ICD-10-CM

## 2023-11-15 DIAGNOSIS — Z811 Family history of alcohol abuse and dependence: Secondary | ICD-10-CM

## 2023-11-15 DIAGNOSIS — Z7982 Long term (current) use of aspirin: Secondary | ICD-10-CM

## 2023-11-15 DIAGNOSIS — I6522 Occlusion and stenosis of left carotid artery: Secondary | ICD-10-CM | POA: Diagnosis present

## 2023-11-15 DIAGNOSIS — Z801 Family history of malignant neoplasm of trachea, bronchus and lung: Secondary | ICD-10-CM

## 2023-11-15 DIAGNOSIS — M419 Scoliosis, unspecified: Secondary | ICD-10-CM | POA: Diagnosis present

## 2023-11-15 DIAGNOSIS — I63032 Cerebral infarction due to thrombosis of left carotid artery: Principal | ICD-10-CM | POA: Diagnosis present

## 2023-11-15 DIAGNOSIS — Z9851 Tubal ligation status: Secondary | ICD-10-CM

## 2023-11-15 DIAGNOSIS — I6529 Occlusion and stenosis of unspecified carotid artery: Principal | ICD-10-CM | POA: Diagnosis present

## 2023-11-15 DIAGNOSIS — M19041 Primary osteoarthritis, right hand: Secondary | ICD-10-CM | POA: Diagnosis present

## 2023-11-15 DIAGNOSIS — M170 Bilateral primary osteoarthritis of knee: Secondary | ICD-10-CM | POA: Diagnosis present

## 2023-11-15 DIAGNOSIS — Z860101 Personal history of adenomatous and serrated colon polyps: Secondary | ICD-10-CM

## 2023-11-15 DIAGNOSIS — M19071 Primary osteoarthritis, right ankle and foot: Secondary | ICD-10-CM | POA: Diagnosis present

## 2023-11-15 HISTORY — PX: PATCH ANGIOPLASTY: SHX6230

## 2023-11-15 HISTORY — PX: ENDARTERECTOMY: SHX5162

## 2023-11-15 LAB — CBC
HCT: 46.2 % — ABNORMAL HIGH (ref 36.0–46.0)
Hemoglobin: 15.7 g/dL — ABNORMAL HIGH (ref 12.0–15.0)
MCH: 31.2 pg (ref 26.0–34.0)
MCHC: 34 g/dL (ref 30.0–36.0)
MCV: 91.8 fL (ref 80.0–100.0)
Platelets: 199 10*3/uL (ref 150–400)
RBC: 5.03 MIL/uL (ref 3.87–5.11)
RDW: 13.4 % (ref 11.5–15.5)
WBC: 9.5 10*3/uL (ref 4.0–10.5)
nRBC: 0 % (ref 0.0–0.2)

## 2023-11-15 LAB — COMPREHENSIVE METABOLIC PANEL WITH GFR
ALT: 27 U/L (ref 0–44)
AST: 23 U/L (ref 15–41)
Albumin: 3.4 g/dL — ABNORMAL LOW (ref 3.5–5.0)
Alkaline Phosphatase: 86 U/L (ref 38–126)
Anion gap: 13 (ref 5–15)
BUN: 50 mg/dL — ABNORMAL HIGH (ref 6–20)
CO2: 16 mmol/L — ABNORMAL LOW (ref 22–32)
Calcium: 9.3 mg/dL (ref 8.9–10.3)
Chloride: 106 mmol/L (ref 98–111)
Creatinine, Ser: 2.69 mg/dL — ABNORMAL HIGH (ref 0.44–1.00)
GFR, Estimated: 20 mL/min — ABNORMAL LOW (ref >60)
Glucose, Bld: 158 mg/dL — ABNORMAL HIGH (ref 70–99)
Potassium: 4.4 mmol/L (ref 3.5–5.1)
Sodium: 135 mmol/L (ref 135–145)
Total Bilirubin: 1 mg/dL (ref 0.0–1.2)
Total Protein: 7.2 g/dL (ref 6.5–8.1)

## 2023-11-15 LAB — TYPE AND SCREEN
ABO/RH(D): A NEG
Antibody Screen: NEGATIVE

## 2023-11-15 LAB — GLUCOSE, CAPILLARY
Glucose-Capillary: 164 mg/dL — ABNORMAL HIGH (ref 70–99)
Glucose-Capillary: 167 mg/dL — ABNORMAL HIGH (ref 70–99)
Glucose-Capillary: 191 mg/dL — ABNORMAL HIGH (ref 70–99)
Glucose-Capillary: 142 mg/dL — ABNORMAL HIGH (ref 70–99)

## 2023-11-15 LAB — APTT: aPTT: 40 s — ABNORMAL HIGH (ref 24–36)

## 2023-11-15 LAB — PROTIME-INR
INR: 1.1 (ref 0.8–1.2)
Prothrombin Time: 14.5 s (ref 11.4–15.2)

## 2023-11-15 LAB — URINALYSIS, ROUTINE W REFLEX MICROSCOPIC
Bilirubin Urine: NEGATIVE
Glucose, UA: 50 mg/dL — AB
Ketones, ur: NEGATIVE mg/dL
Nitrite: NEGATIVE
Protein, ur: 100 mg/dL — AB
Specific Gravity, Urine: 1.015 (ref 1.005–1.030)
pH: 5 (ref 5.0–8.0)

## 2023-11-15 LAB — SURGICAL PCR SCREEN
MRSA, PCR: NEGATIVE
Staphylococcus aureus: NEGATIVE

## 2023-11-15 SURGERY — ENDARTERECTOMY, CAROTID
Anesthesia: General | Laterality: Left

## 2023-11-15 MED ORDER — FENTANYL CITRATE (PF) 250 MCG/5ML IJ SOLN
INTRAMUSCULAR | Status: DC | PRN
  Administered 2023-11-15: 100 ug via INTRAVENOUS
  Administered 2023-11-15: 50 ug via INTRAVENOUS
  Administered 2023-11-15: 150 ug via INTRAVENOUS
  Administered 2023-11-15 (×2): 100 ug via INTRAVENOUS

## 2023-11-15 MED ORDER — LABETALOL HCL 5 MG/ML IV SOLN: 10.0000 mg | INTRAVENOUS | Status: DC | PRN

## 2023-11-15 MED ORDER — ALBUMIN HUMAN 5 % IV SOLN
12.5000 g | Freq: Once | INTRAVENOUS | Status: AC
  Administered 2023-11-15: 12.5 g via INTRAVENOUS

## 2023-11-15 MED ORDER — HEPARIN SODIUM (PORCINE) 1000 UNIT/ML IJ SOLN
INTRAMUSCULAR | Status: DC | PRN
  Administered 2023-11-15: 10000 [IU] via INTRAVENOUS

## 2023-11-15 MED ORDER — CHLORHEXIDINE GLUCONATE 0.12 % MT SOLN
OROMUCOSAL | Status: AC
Start: 2023-11-15 — End: 2023-11-15
  Administered 2023-11-15: 15 mL
  Filled 2023-11-15: qty 15

## 2023-11-15 MED ORDER — HYDRALAZINE HCL 20 MG/ML IJ SOLN: 5.0000 mg | INTRAMUSCULAR | Status: DC | PRN

## 2023-11-15 MED ORDER — 0.9 % SODIUM CHLORIDE (POUR BTL) OPTIME
TOPICAL | Status: DC | PRN
  Administered 2023-11-15: 2000 mL

## 2023-11-15 MED ORDER — ACETAMINOPHEN 325 MG PO TABS: 325.0000 mg | ORAL_TABLET | ORAL | Status: DC | PRN

## 2023-11-15 MED ORDER — ALBUTEROL SULFATE HFA 108 (90 BASE) MCG/ACT IN AERS: 2.0000 | INHALATION_SPRAY | Freq: Four times a day (QID) | RESPIRATORY_TRACT | Status: DC | PRN

## 2023-11-15 MED ORDER — ALBUMIN HUMAN 5 % IV SOLN
INTRAVENOUS | Status: AC
Start: 2023-11-15 — End: 2023-11-15
  Filled 2023-11-15: qty 250

## 2023-11-15 MED ORDER — ALUM & MAG HYDROXIDE-SIMETH 200-200-20 MG/5ML PO SUSP: 15.0000 mL | ORAL | Status: DC | PRN

## 2023-11-15 MED ORDER — ACETAMINOPHEN 650 MG RE SUPP: 325.0000 mg | RECTAL | Status: DC | PRN

## 2023-11-15 MED ORDER — POLYETHYLENE GLYCOL 3350 17 G PO PACK: 17.0000 g | PACK | Freq: Every day | ORAL | Status: DC | PRN

## 2023-11-15 MED ORDER — ONDANSETRON HCL 4 MG/2ML IJ SOLN
INTRAMUSCULAR | Status: DC | PRN
  Administered 2023-11-15: 60 mg via INTRAVENOUS
  Administered 2023-11-15: 4 mg via INTRAVENOUS

## 2023-11-15 MED ORDER — DOPAMINE-DEXTROSE 3.2-5 MG/ML-% IV SOLN
3.0000 ug/kg/min | INTRAVENOUS | Status: DC
  Filled 2023-11-15: qty 250

## 2023-11-15 MED ORDER — CHLORHEXIDINE GLUCONATE CLOTH 2 % EX PADS: 6.0000 | MEDICATED_PAD | Freq: Once | CUTANEOUS | Status: DC

## 2023-11-15 MED ORDER — CEFAZOLIN SODIUM-DEXTROSE 2-4 GM/100ML-% IV SOLN
2.0000 g | Freq: Three times a day (TID) | INTRAVENOUS | Status: AC
  Administered 2023-11-15 – 2023-11-16 (×2): 2 g via INTRAVENOUS
  Filled 2023-11-15 (×2): qty 100

## 2023-11-15 MED ORDER — POTASSIUM CHLORIDE CRYS ER 20 MEQ PO TBCR: 20.0000 meq | EXTENDED_RELEASE_TABLET | Freq: Every day | ORAL | Status: DC | PRN

## 2023-11-15 MED ORDER — PROPOFOL 10 MG/ML IV BOLUS
INTRAVENOUS | Status: AC
  Filled 2023-11-15: qty 20

## 2023-11-15 MED ORDER — BISACODYL 10 MG RE SUPP: 10.0000 mg | Freq: Every day | RECTAL | Status: DC | PRN

## 2023-11-15 MED ORDER — HEPARIN 6000 UNIT IRRIGATION SOLUTION
Status: AC
Start: 2023-11-15 — End: ?
  Filled 2023-11-15: qty 500

## 2023-11-15 MED ORDER — DOPAMINE-DEXTROSE 3.2-5 MG/ML-% IV SOLN
5.0000 ug/kg/min | INTRAVENOUS | Status: DC
  Administered 2023-11-15: 5 ug/kg/min via INTRAVENOUS

## 2023-11-15 MED ORDER — DOCUSATE SODIUM 100 MG PO CAPS
100.0000 mg | ORAL_CAPSULE | Freq: Every day | ORAL | Status: DC
  Filled 2023-11-15: qty 1

## 2023-11-15 MED ORDER — PROTAMINE SULFATE 10 MG/ML IV SOLN
INTRAVENOUS | Status: DC | PRN
  Administered 2023-11-15: 50 mg via INTRAVENOUS

## 2023-11-15 MED ORDER — HYDROMORPHONE HCL 1 MG/ML IJ SOLN
INTRAMUSCULAR | Status: AC
  Filled 2023-11-15: qty 0.5

## 2023-11-15 MED ORDER — SODIUM CHLORIDE 0.9 % IV SOLN: 500.0000 mL | Freq: Once | INTRAVENOUS | Status: DC | PRN

## 2023-11-15 MED ORDER — CEFAZOLIN SODIUM-DEXTROSE 2-4 GM/100ML-% IV SOLN
2.0000 g | INTRAVENOUS | Status: AC
  Administered 2023-11-15: 2 g via INTRAVENOUS
  Filled 2023-11-15: qty 100

## 2023-11-15 MED ORDER — CLOPIDOGREL BISULFATE 75 MG PO TABS
75.0000 mg | ORAL_TABLET | Freq: Every day | ORAL | Status: DC
  Administered 2023-11-16: 75 mg via ORAL
  Filled 2023-11-15: qty 1

## 2023-11-15 MED ORDER — LIDOCAINE 2% (20 MG/ML) 5 ML SYRINGE
INTRAMUSCULAR | Status: DC | PRN
  Administered 2023-11-15: 60 mg via INTRAVENOUS

## 2023-11-15 MED ORDER — INSULIN ASPART 100 UNIT/ML IJ SOLN
0.0000 [IU] | Freq: Three times a day (TID) | INTRAMUSCULAR | Status: DC
  Administered 2023-11-15: 3 [IU] via SUBCUTANEOUS

## 2023-11-15 MED ORDER — ACETAMINOPHEN 500 MG PO TABS
1000.0000 mg | ORAL_TABLET | Freq: Once | ORAL | Status: AC
  Administered 2023-11-15: 1000 mg via ORAL
  Filled 2023-11-15: qty 2

## 2023-11-15 MED ORDER — NAPHAZOLINE-GLYCERIN 0.012-0.25 % OP SOLN
1.0000 [drp] | Freq: Every day | OPHTHALMIC | Status: DC
  Administered 2023-11-15: 1 [drp] via OPHTHALMIC
  Administered 2023-11-16: 2 [drp] via OPHTHALMIC
  Filled 2023-11-15: qty 15

## 2023-11-15 MED ORDER — AMLODIPINE BESYLATE 5 MG PO TABS
5.0000 mg | ORAL_TABLET | Freq: Every day | ORAL | Status: DC
  Administered 2023-11-16: 5 mg via ORAL
  Filled 2023-11-15: qty 1

## 2023-11-15 MED ORDER — FENTANYL CITRATE (PF) 250 MCG/5ML IJ SOLN
INTRAMUSCULAR | Status: AC
  Filled 2023-11-15: qty 5

## 2023-11-15 MED ORDER — PROPOFOL 10 MG/ML IV BOLUS
INTRAVENOUS | Status: DC | PRN
  Administered 2023-11-15: 130 mg via INTRAVENOUS

## 2023-11-15 MED ORDER — PHENOL 1.4 % MT LIQD: 1.0000 | OROMUCOSAL | Status: DC | PRN

## 2023-11-15 MED ORDER — ROCURONIUM BROMIDE 10 MG/ML (PF) SYRINGE
PREFILLED_SYRINGE | INTRAVENOUS | Status: DC | PRN
  Administered 2023-11-15: 70 mg via INTRAVENOUS
  Administered 2023-11-15: 10 mg via INTRAVENOUS

## 2023-11-15 MED ORDER — PANTOPRAZOLE SODIUM 40 MG PO TBEC
40.0000 mg | DELAYED_RELEASE_TABLET | Freq: Every day | ORAL | Status: DC
  Administered 2023-11-16: 40 mg via ORAL
  Filled 2023-11-15: qty 1

## 2023-11-15 MED ORDER — DOPAMINE-DEXTROSE 3.2-5 MG/ML-% IV SOLN
INTRAVENOUS | Status: AC
  Filled 2023-11-15: qty 250

## 2023-11-15 MED ORDER — METOPROLOL TARTRATE 5 MG/5ML IV SOLN: 2.0000 mg | INTRAVENOUS | Status: DC | PRN

## 2023-11-15 MED ORDER — ASPIRIN 81 MG PO TBEC
81.0000 mg | DELAYED_RELEASE_TABLET | Freq: Every day | ORAL | Status: DC
  Administered 2023-11-16: 81 mg via ORAL
  Filled 2023-11-15: qty 1

## 2023-11-15 MED ORDER — PHENYLEPHRINE HCL-NACL 20-0.9 MG/250ML-% IV SOLN
INTRAVENOUS | Status: DC | PRN
  Administered 2023-11-15: 40 ug/min via INTRAVENOUS

## 2023-11-15 MED ORDER — MAGNESIUM SULFATE 2 GM/50ML IV SOLN: 2.0000 g | Freq: Every day | INTRAVENOUS | Status: DC | PRN

## 2023-11-15 MED ORDER — HYDROMORPHONE HCL 1 MG/ML IJ SOLN
INTRAMUSCULAR | Status: DC | PRN
  Administered 2023-11-15: .5 mg via INTRAVENOUS

## 2023-11-15 MED ORDER — SODIUM CHLORIDE 0.9 % IV SOLN: INTRAVENOUS | Status: DC

## 2023-11-15 MED ORDER — ALBUMIN HUMAN 5 % IV SOLN
INTRAVENOUS | Status: AC
  Filled 2023-11-15: qty 250

## 2023-11-15 MED ORDER — HEPARIN 6000 UNIT IRRIGATION SOLUTION
Status: DC | PRN
  Administered 2023-11-15: 1

## 2023-11-15 MED ORDER — PHENYLEPHRINE 80 MCG/ML (10ML) SYRINGE FOR IV PUSH (FOR BLOOD PRESSURE SUPPORT)
PREFILLED_SYRINGE | INTRAVENOUS | Status: DC | PRN
  Administered 2023-11-15 (×2): 80 ug via INTRAVENOUS
  Administered 2023-11-15: 160 ug via INTRAVENOUS
  Administered 2023-11-15: 80 ug via INTRAVENOUS

## 2023-11-15 MED ORDER — LIDOCAINE HCL (PF) 1 % IJ SOLN
INTRAMUSCULAR | Status: AC
  Filled 2023-11-15: qty 5

## 2023-11-15 MED ORDER — OXYCODONE-ACETAMINOPHEN 5-325 MG PO TABS: 1.0000 | ORAL_TABLET | ORAL | Status: DC | PRN

## 2023-11-15 MED ORDER — ONDANSETRON HCL 4 MG/2ML IJ SOLN: 4.0000 mg | Freq: Four times a day (QID) | INTRAMUSCULAR | Status: DC | PRN

## 2023-11-15 MED ORDER — SUGAMMADEX SODIUM 200 MG/2ML IV SOLN
INTRAVENOUS | Status: DC | PRN
  Administered 2023-11-15: 200 mg via INTRAVENOUS

## 2023-11-15 MED ORDER — HEMOSTATIC AGENTS (NO CHARGE) OPTIME
TOPICAL | Status: DC | PRN
  Administered 2023-11-15 (×2): 1 via TOPICAL

## 2023-11-15 MED ORDER — DEXAMETHASONE SODIUM PHOSPHATE 10 MG/ML IJ SOLN
INTRAMUSCULAR | Status: DC | PRN
  Administered 2023-11-15: 5 mg via INTRAVENOUS

## 2023-11-15 MED ORDER — GUAIFENESIN-DM 100-10 MG/5ML PO SYRP: 15.0000 mL | ORAL_SOLUTION | ORAL | Status: DC | PRN

## 2023-11-15 MED ORDER — MORPHINE SULFATE (PF) 2 MG/ML IV SOLN: 2.0000 mg | INTRAVENOUS | Status: DC | PRN

## 2023-11-15 MED ORDER — NICOTINE 21 MG/24HR TD PT24
21.0000 mg | MEDICATED_PATCH | Freq: Every day | TRANSDERMAL | Status: DC
  Filled 2023-11-15: qty 1

## 2023-11-15 MED ORDER — ROSUVASTATIN CALCIUM 20 MG PO TABS
20.0000 mg | ORAL_TABLET | Freq: Every day | ORAL | Status: DC
  Administered 2023-11-16: 20 mg via ORAL
  Filled 2023-11-15: qty 1

## 2023-11-15 MED ORDER — HYDROMORPHONE HCL 1 MG/ML IJ SOLN: 0.2500 mg | INTRAMUSCULAR | Status: DC | PRN

## 2023-11-15 SURGICAL SUPPLY — 39 items
BAG COUNTER SPONGE SURGICOUNT (BAG) ×2 IMPLANT
CANISTER SUCT 3000ML PPV (MISCELLANEOUS) ×2 IMPLANT
CATH ROBINSON RED A/P 18FR (CATHETERS) ×2 IMPLANT
CLIP TI MEDIUM 24 (CLIP) ×2 IMPLANT
CLIP TI WIDE RED SMALL 24 (CLIP) ×2 IMPLANT
COVER PROBE W GEL 5X96 (DRAPES) IMPLANT
COVER TRANSDUCER ULTRASND GEL (DISPOSABLE) ×2 IMPLANT
DERMABOND ADVANCED .7 DNX12 (GAUZE/BANDAGES/DRESSINGS) ×2 IMPLANT
ELECT REM PT RETURN 9FT ADLT (ELECTROSURGICAL) ×2 IMPLANT
ELECTRODE REM PT RTRN 9FT ADLT (ELECTROSURGICAL) ×2 IMPLANT
GLOVE BIO SURGEON STRL SZ7.5 (GLOVE) ×2 IMPLANT
GLOVE BIOGEL PI IND STRL 8 (GLOVE) ×2 IMPLANT
GOWN STRL REUS W/ TWL LRG LVL3 (GOWN DISPOSABLE) ×4 IMPLANT
GOWN STRL REUS W/ TWL XL LVL3 (GOWN DISPOSABLE) ×4 IMPLANT
HEMOSTAT SNOW SURGICEL 2X4 (HEMOSTASIS) IMPLANT
KIT BASIN OR (CUSTOM PROCEDURE TRAY) ×2 IMPLANT
KIT SHUNT ARGYLE CAROTID ART 6 (VASCULAR PRODUCTS) IMPLANT
KIT TURNOVER KIT B (KITS) ×2 IMPLANT
NDL HYPO 25GX1X1/2 BEV (NEEDLE) IMPLANT
NDL SPNL 20GX3.5 QUINCKE YW (NEEDLE) IMPLANT
NEEDLE HYPO 25GX1X1/2 BEV (NEEDLE) IMPLANT
NEEDLE SPNL 20GX3.5 QUINCKE YW (NEEDLE) IMPLANT
NS IRRIG 1000ML POUR BTL (IV SOLUTION) ×6 IMPLANT
PACK CAROTID (CUSTOM PROCEDURE TRAY) ×2 IMPLANT
PAD ARMBOARD POSITIONER FOAM (MISCELLANEOUS) ×4 IMPLANT
PATCH VASC XENOSURE 1X6 (Vascular Products) IMPLANT
POSITIONER HEAD DONUT 9IN (MISCELLANEOUS) ×2 IMPLANT
SHUNT CAROTID BYPASS 10 (VASCULAR PRODUCTS) IMPLANT
SHUNT CAROTID BYPASS 12FRX15.5 (VASCULAR PRODUCTS) IMPLANT
SUT MNCRL AB 4-0 PS2 18 (SUTURE) ×2 IMPLANT
SUT PROLENE 5 0 C 1 24 (SUTURE) ×2 IMPLANT
SUT PROLENE 6 0 BV (SUTURE) ×2 IMPLANT
SUT PROLENE 7 0 BV1 MDA (SUTURE) IMPLANT
SUT SILK 3-0 18XBRD TIE 12 (SUTURE) IMPLANT
SUT VIC AB 3-0 SH 27X BRD (SUTURE) ×2 IMPLANT
SYR CONTROL 10ML LL (SYRINGE) IMPLANT
TOWEL GREEN STERILE (TOWEL DISPOSABLE) ×2 IMPLANT
VASCULAR TIE MINI RED 18IN STL (MISCELLANEOUS) IMPLANT
WATER STERILE IRR 1000ML POUR (IV SOLUTION) ×2 IMPLANT

## 2023-11-15 NOTE — Progress Notes (Signed)
 Patient received from PACU, patient is alert, NIH 0, vital's obtained, CCMD notified, CHG bath given, all needs met, call bell in reach Dopamine  infusion is continue as per the order, BP is within the target.   11/15/23 1427  Vitals  Temp 98 F (36.7 C)  Temp Source Oral  BP (!) 100/50  MAP (mmHg) 66  BP Location Left Arm  BP Method Automatic  Patient Position (if appropriate) Lying  Pulse Rate 87  Pulse Rate Source Monitor  ECG Heart Rate 86  Resp 13  Level of Consciousness  Level of Consciousness Alert  MEWS COLOR  MEWS Score Color Yellow  Oxygen Therapy  SpO2 93 %  O2 Device Room Air  Pain Assessment  Pain Scale 0-10  MEWS Score  MEWS Temp 0  MEWS Systolic 1  MEWS Pulse 0  MEWS RR 1  MEWS LOC 0  MEWS Score 2

## 2023-11-15 NOTE — Discharge Instructions (Signed)

## 2023-11-15 NOTE — Anesthesia Postprocedure Evaluation (Signed)
 Anesthesia Post Note  Patient: Erica Graham  Procedure(s) Performed: ENDARTERECTOMY, CAROTID (Left) ANGIOPLASTY, USING PATCH GRAFT     Patient location during evaluation: PACU Anesthesia Type: General Level of consciousness: awake and alert Pain management: pain level controlled Vital Signs Assessment: post-procedure vital signs reviewed and stable Respiratory status: spontaneous breathing, nonlabored ventilation, respiratory function stable and patient connected to nasal cannula oxygen Cardiovascular status: blood pressure returned to baseline and stable Postop Assessment: no apparent nausea or vomiting Anesthetic complications: no  There were no known notable events for this encounter.  Last Vitals:  Vitals:   11/15/23 1300 11/15/23 1315  BP: (!) 150/39 (!) 143/45  Pulse: 87 94  Resp: 15 13  Temp:    SpO2: 92% 95%    Last Pain:  Vitals:   11/15/23 1230  TempSrc:   PainSc: 0-No pain                 Nahum Sherrer,W. EDMOND

## 2023-11-15 NOTE — Progress Notes (Addendum)
 Verbal order obtained from Dr. Gerarda Fraction to d/c dopamine infusion, Patient's BP is WNL now, will give report to night RN to restart the drip again if SBP less than 100 as per the order.

## 2023-11-15 NOTE — Op Note (Signed)
 OPERATIVE NOTE DATE: November 15, 2023   PROCEDURE:   1.  left carotid endarterectomy with bovine patch angioplasty 2.  left intraoperative carotid ultrasound  PRE-OPERATIVE DIAGNOSIS: left symptomatic high grade carotid stenosis >80%  POST-OPERATIVE DIAGNOSIS: same as above   SURGEON: Cephus Shelling, MD  ASSISTANT(S): Doreatha Massed, PA  ANESTHESIA: general  ESTIMATED BLOOD LOSS: <75 mL  FINDING(S): Extensive calcification in the left carotid artery with high-grade proximal internal carotid artery stenosis with ruptured plaque and subacute thrombus.  After endarterectomy and bovine patch angioplasty there were excellent Doppler signals in the internal and external carotid artery.  No flap visualized on intraoperative duplex.  SPECIMEN(S):  Carotid plaque (sent to Pathology)  INDICATIONS:   Erica Graham is a 58 y.o. female who presents with left symptomatic high grade carotid stenosis >80%.  I discussed with the patient the risks, benefits, and alternatives to carotid endarterectomy.  Due to extensive calcification in the common carotid artery she is not a candidate for stenting. I discussed the procedural details of carotid endarterectomy with the patient.  The patient is aware that the risks of carotid endarterectomy include but are not limited to: bleeding, infection, stroke, myocardial infarction, death, cranial nerve injuries both temporary and permanent, neck hematoma, possible airway compromise, labile blood pressure post-operatively, cerebral hyperperfusion syndrome, and possible need for additional interventions in the future. The patient is aware of the risks and agrees to proceed forward with the procedure.  Assistant was needed given the complexity of the case and also for the endarterectomy and patch angioplasty.  DESCRIPTION: After full informed written consent was obtained from the patient, the patient was brought back to the operating room and placed supine  upon the operating table.  Prior to induction, the patient received IV antibiotics.  After obtaining adequate anesthesia, the patient was placed into semi-Fowler position with a shoulder roll in place and the patient's neck slightly hyperextended and rotated away from the surgical site.  The patient was prepped in the standard fashion for a left carotid endarterectomy.  I made an incision anterior to the sternocleidomastoid muscle and dissected down through the subcutaneous tissue.  The platysma was opened with electrocautery.  I then used Bovie cautery and blunt dissection to dissect through the underlying platysma and to mobilize the anterior border of the sternocleidomastoid as well as the internal jugular vein laterally.  The facial vein was ligated with 3-0 silk ties and divided.  After identifying the carotid artery I used Metzenbaum scissors to bluntly dissect the common carotid artery and then controlled this with both a large vessel loop and a umbilical tape.  At this point in time the patient was given 100 units/kg of IV heparin and we checked an ACT to ensure it was greater than 250.  I then carried my dissection cephalad and mobilized the external carotid artery and superior thyroid artery and controlled each of these with a vessel loop.  I then dissected out the internal carotid artery well past the distal plaque.  The internal carotid artery was then controlled with a umbilical tape as well. I was careful to identify the vagus nerve between the internal jugular and common carotid and this was presereved.  I was also careful to identify and preserve the hypoglossal nerve and this was preserved.  Once our ACT was confirmed, I proceeded by clamping the internal carotid artery with a angled bulldog clamp first.  The proximal common carotid artery was controlled with a baby profunda.  The external carotid was controlled with a vessel loop.  I subsequently opened the common carotid artery with an 11 blade  scalpel in longitudinal fashion and extended the arteriotomy with Potts scissors onto the ICA past the distal plaque.  Due to extensive common carotid disease including at the proximal common carotid artery elected not to place a shunt.  There was pulsatile backbleeding from the ICA stump.  I then used a Runner, broadcasting/film/video and performed a endarterectomy starting in the common carotid artery.  The external carotid artery was endarterectomized with an eversion technique and I was careful to feather the distal ICA plaque.  The specimen was passed off the field.  The endarterectomy site was then flushed with heparinized saline and I was careful to ensure there were no flaps in the endarterectomy site.  The distal endarterectomy site and the ICA was tacked down with multiple 7-0 Prolene's.  I then brought a bovine carotid patch on the field and this was sewn in place with a running anastomosis using a 6-0 Prolene distally and a 5-0 proximal with the help of my assistant.  The bovine patch was trimmed accordingly.  The artery was flushed antegrade and retrograde prior to completion of the patch.  Once the patch was complete, I flushed up the external carotid artery first prior to releasing the internal carotid artery clamp.  An intraoperative duplex was performed that showed no evidence of any flaps.  Once I was happy with the intraoperative ultrasound the patient was given protamine for reversal.  I used surgicel snow to get hemostasis around the patch.  Ultimately the platysma was closed in running fashion with 3-0 Vicryl.  The skin was closed with a running 4-0 Monocryl.  Dermabond was applied with a dry sterile dressing.  The patient was awakened from anesthesia with no new neurological deficit and taken to PACU in stable condition.    COMPLICATIONS: None  CONDITION: Stable  Cephus Shelling, MD [facility] Office: [phone]  Cephus Shelling   11/15/2023, 10:13  AM

## 2023-11-15 NOTE — Transfer of Care (Signed)
 Immediate Anesthesia Transfer of Care Note  Patient: Erica Graham  Procedure(s) Performed: ENDARTERECTOMY, CAROTID (Left) ANGIOPLASTY, USING PATCH GRAFT  Patient Location: PACU  Anesthesia Type:General  Level of Consciousness: awake, alert , and oriented  Airway & Oxygen Therapy: Patient Spontanous Breathing and Patient connected to nasal cannula oxygen  Post-op Assessment: Report given to RN and Post -op Vital signs reviewed and stable  Post vital signs: Reviewed and stable  Last Vitals:  Vitals Value Taken Time  BP 115/78 11/15/23 1030  Temp    Pulse 81 11/15/23 1034  Resp 9 11/15/23 1034  SpO2 93 % 11/15/23 1034  Vitals shown include unfiled device data.  Last Pain:  Vitals:   11/15/23 0634  TempSrc:   PainSc: 0-No pain       Alert and oriented, denies pain, VSS, report given to PACU RN, Surgeon at bedside for neuro checks  Complications: No notable events documented.

## 2023-11-15 NOTE — Inpatient Diabetes Management (Signed)
 Inpatient Diabetes Program Recommendations  AACE/ADA: New Consensus Statement on Inpatient Glycemic Control (2015)  Target Ranges:  Prepandial:   less than 140 mg/dL      Peak postprandial:   less than 180 mg/dL (1-2 hours)      Critically ill patients:  140 - 180 mg/dL   Lab Results  Component Value Date   GLUCAP 167 (H) 11/15/2023   HGBA1C 9.1 (H) 11/08/2023    Diabetes history: DM2 Outpatient Diabetes medications: Jardiance 10 mg Current orders for Inpatient glycemic control: Decadron 5 mg @ 0824  Inpatient Diabetes Program Recommendations:   DM coordinator spoke with patient on 11/10/23 regarding A1c 9.1. Ordered Living Well With Diabetes booklet. Please consider while in the hospital: -Glycemic control order set 0-9 units tid, 0-5 units hs  Thank you, Darel Hong E. Jeramie Scogin, RN, MSN, CDCES  Diabetes Coordinator Inpatient Glycemic Control Team Team Pager [phone] (8am-5pm) 11/15/2023 2:33 PM

## 2023-11-15 NOTE — Anesthesia Procedure Notes (Signed)
 Procedure Name: Intubation Date/Time: 11/15/2023 7:58 AM  Performed by: Allyn Kenner, CRNAPre-anesthesia Checklist: Patient identified, Emergency Drugs available, Suction available and Patient being monitored Patient Re-evaluated:Patient Re-evaluated prior to induction Oxygen Delivery Method: Circle System Utilized Preoxygenation: Pre-oxygenation with 100% oxygen Induction Type: IV induction Ventilation: Mask ventilation without difficulty Laryngoscope Size: Mac and 4 Grade View: Grade I Tube type: Oral Number of attempts: 1 Airway Equipment and Method: Stylet and Oral airway Placement Confirmation: ETT inserted through vocal cords under direct vision, positive ETCO2 and breath sounds checked- equal and bilateral Secured at: 21 cm Tube secured with: Tape Dental Injury: Teeth and Oropharynx as per pre-operative assessment

## 2023-11-15 NOTE — Plan of Care (Signed)
  Problem: Clinical Measurements: Goal: Will remain free from infection Outcome: Progressing   Problem: Clinical Measurements: Goal: Diagnostic test results will improve Outcome: Progressing   Problem: Nutrition: Goal: Adequate nutrition will be maintained Outcome: Progressing   Problem: Elimination: Goal: Will not experience complications related to bowel motility Outcome: Progressing Goal: Will not experience complications related to urinary retention Outcome: Progressing   Problem: Skin Integrity: Goal: Risk for impaired skin integrity will decrease Outcome: Progressing   Problem: Skin Integrity: Goal: Demonstration of wound healing without infection will improve Outcome: Progressing   Problem: Nutritional: Goal: Maintenance of adequate nutrition will improve Outcome: Progressing Goal: Progress toward achieving an optimal weight will improve Outcome: Progressing   Problem: Skin Integrity: Goal: Risk for impaired skin integrity will decrease Outcome: Progressing   Problem: Tissue Perfusion: Goal: Adequacy of tissue perfusion will improve Outcome: Progressing

## 2023-11-15 NOTE — H&P (Signed)
 History and Physical Interval Note:  11/15/2023 7:33 AM  Erica Graham  has presented today for surgery, with the diagnosis of Symptomatic Left Carotid Artery Stenosis.  The various methods of treatment have been discussed with the patient and family. After consideration of risks, benefits and other options for treatment, the patient has consented to  Procedure(s): ENDARTERECTOMY, CAROTID (Left) as a surgical intervention.  The patient's history has been reviewed, patient examined, no change in status, stable for surgery.  I have reviewed the patient's chart and labs.  Questions were answered to the patient's satisfaction.    Plan left CEA for symptomatic high grade stenosis.  Confirmed patent by arteriogram.  Discussed risks and benefits including 1% perioperative stroke risk.    [facility] Consult       Reason for Consult:  symptomatic left ICA stenosis  Requesting Physician:  Dr. Thedore Mins MRN #:  [id number]   History of Present Illness: This is a 58 y.o. female with pertinent past medical history of HLD, HTN, Depression, Anxiety, GERD, Asthma, chronic tobacco use, and T2DM who presented to ER at [facility] on Saturday with slurred speech and trouble word finding. She denies prior history of TIA or stroke. She denies any associated visual changes, upper or lower extremity weakness or numbness, or facial droop. She has had no prior neck radiation or surgery. She currently smokes 1/2 ppd. She had CTA head showing multiple foci of hypo attenuation and left frontal lobe basal ganglia concerning for subacute infarcts. CTA of head and neck revealing critical left ICA stenosis. MRI brain is pending. Vascular surgery was consulted for further evaluation and management.        Past Medical History:  Diagnosis Date   Acute CVA (cerebrovascular accident) (HCC) 11/07/2023   Anxiety 03/05/2016   Arthritis      rheumatoid   Asthma     Body aches 03/05/2016   Depression      Fibromyalgia     GERD (gastroesophageal reflux disease)     Hyperlipidemia     Hypertension     Obesity     Recent onset of diabetes mellitus (HCC) 03/10/2016    Referred to Dr Fransico Him and rx metformin 500 mg bid and zocor 20 mg 1 daily   Varicose vein of leg 03/05/2016   Vitiligo 03/05/2016               Past Surgical History:  Procedure Laterality Date   CHOLECYSTECTOMY       COLONOSCOPY N/A 08/20/2016    Procedure: COLONOSCOPY;  Surgeon: Malissa Hippo, MD;  Location: [facility];  Service: Endoscopy;  Laterality: N/A;  830     DILATION AND CURETTAGE OF UTERUS       GALLBLADDER SURGERY       LEG SURGERY Left      broke leg   POLYPECTOMY   08/20/2016    Procedure: POLYPECTOMY;  Surgeon: Malissa Hippo, MD;  Location: [facility];  Service: Endoscopy;;  colon   TUBAL LIGATION              Allergies       Allergies  Allergen Reactions   Lipitor [Atorvastatin] Other (See Comments)      Myalgia and joint pain               Prior to Admission medications   Medication Sig Start Date End Date Taking? Authorizing Provider  albuterol (PROVENTIL HFA;VENTOLIN HFA) 108 (90 Base)  MCG/ACT inhaler Inhale 2 puffs into the lungs every 6 (six) hours as needed for wheezing or shortness of breath. 05/28/16   Yes Eustace Moore, MD  Cholecalciferol (VITAMIN D3) 5000 units CAPS Take 1 capsule (5,000 Units total) by mouth daily. 07/16/16   Yes Nida, Denman George, MD  ibuprofen (ADVIL,MOTRIN) 200 MG tablet Take 400 mg by mouth as needed for headache or moderate pain.      Yes [provider]  diphenhydrAMINE (BENADRYL) 25 MG tablet Take 50 mg by mouth at bedtime as needed for sleep.  Patient not taking: Reported on 11/08/2023       [provider]  DULoxetine (CYMBALTA) 30 MG capsule Take 1 capsule (30 mg total) by mouth daily. Patient not taking: Reported on 11/08/2023 06/03/17     Eustace Moore, MD  losartan (COZAAR) 25 MG tablet Take 1 tablet (25 mg total) by  mouth daily. Patient not taking: Reported on 11/08/2023 12/03/16     Eustace Moore, MD  Polyethyl Glycol-Propyl Glycol (SYSTANE OP) Apply 1 drop to eye as needed.  Patient not taking: Reported on 11/08/2023       [provider]  predniSONE (DELTASONE) 5 MG tablet Take 1 tablet (5 mg total) by mouth daily with breakfast. Patient not taking: Reported on 11/08/2023 07/15/17     Eustace Moore, MD      Social History         Socioeconomic History   Marital status: Divorced      Spouse name: Not on file   Number of children: 2   Years of education: 13   Highest education level: Not on file  Occupational History   Occupation: hairdresser      Comment: self  Tobacco Use   Smoking status: Every Day      Current packs/day: 1.00      Average packs/day: 1 pack/day for 20.0 years (20.0 ttl pk-yrs)      Types: Cigarettes   Smokeless tobacco: Never  Vaping Use   Vaping status: Never Used  Substance and Sexual Activity   Alcohol use: Yes      Comment: occ 1 monthly    Drug use: No   Sexual activity: Not Currently      Birth control/protection: Surgical      Comment: tubal  Other Topics Concern   Not on file  Social History Narrative    Lives at home with two children, son is 13, daughter is 65    Social Drivers of Acupuncturist Strain: Not on file  Food Insecurity: No Food Insecurity (11/08/2023)    Hunger Vital Sign     Worried About Running Out of Food in the Last Year: Never true     Ran Out of Food in the Last Year: Never true  Transportation Needs: No Transportation Needs (11/08/2023)    PRAPARE - Therapist, art (Medical): No     Lack of Transportation (Non-Medical): No  Physical Activity: Not on file  Stress: Not on file  Social Connections: Not on file  Intimate Partner Violence: Not At Risk (11/08/2023)    Humiliation, Afraid, Rape, and Kick questionnaire     Fear of Current or Ex-Partner: No     Emotionally  Abused: No     Physically Abused: No     Sexually Abused: No             Family  History  Problem Relation Age of Onset   Cancer Paternal Grandfather          prostate   Diabetes Paternal Grandmother     Emphysema Maternal Grandmother     Cancer Maternal Grandmother          lung   Alcohol abuse Maternal Grandfather     Diabetes Father     Hypertension Mother     Diabetes Mother     Crohn's disease Sister            ROS: Otherwise negative unless mentioned in HPI   Physical Examination       Vitals:    11/08/23 0445 11/08/23 0600  BP: (!) 137/56 (!) 160/80  Pulse: 78 77  Resp: 20 (!) 22  Temp:   98.3 F (36.8 C)  SpO2: 95% 95%    Body mass index is 40.64 kg/m.   General:  WDWN in NAD Gait: normal, observed walking in hall with PT HENT: WNL, normocephalic Pulmonary: normal non-labored breathing, without wheezing Cardiac: regular Abdomen:  soft Vascular Exam/Pulses:Moving extremities without deficits, 5/5 grip strength bilaterally Musculoskeletal: no muscle wasting or atrophy       Neurologic: A&O X 3;  No focal weakness or paresthesias are detected; speech is coherent. Some trouble with word finding Psychiatric:  The pt has Normal affect.   CBC Labs (Brief)          Component Value Date/Time    WBC 7.4 11/08/2023 0820    RBC 4.87 11/08/2023 0820    HGB 15.1 (H) 11/08/2023 0820    HGB 15.8 03/05/2016 1127    HCT 44.6 11/08/2023 0820    HCT 45.7 03/05/2016 1127    PLT 158 11/08/2023 0820    PLT 173 03/05/2016 1127    MCV 91.6 11/08/2023 0820    MCV 95 03/05/2016 1127    MCH 31.0 11/08/2023 0820    MCHC 33.9 11/08/2023 0820    RDW 13.5 11/08/2023 0820    RDW 13.5 03/05/2016 1127    LYMPHSABS 0.9 11/08/2023 0820    MONOABS 0.5 11/08/2023 0820    EOSABS 0.0 11/08/2023 0820    BASOSABS 0.0 11/08/2023 0820        BMET Labs (Brief)          Component Value Date/Time    NA 136 11/08/2023 0820    NA 139 04/15/2017 1007    K 5.0 11/08/2023 0820     CL 107 11/08/2023 0820    CO2 21 (L) 11/08/2023 0820    GLUCOSE 192 (H) 11/08/2023 0820    BUN 29 (H) 11/08/2023 0820    BUN 16 04/15/2017 1007    CREATININE 1.97 (H) 11/08/2023 0820    CREATININE 1.29 (H) 05/27/2017 0831    CALCIUM 8.7 (L) 11/08/2023 0820    GFRNONAA 29 (L) 11/08/2023 0820    GFRNONAA 48 (L) 05/27/2017 0831    GFRAA 55 (L) 05/27/2017 0831        COAGS: Recent Labs       Lab Results  Component Value Date    INR 1.0 11/07/2023          Non-Invasive Vascular Imaging:  CT Angiography head and neck with and without contrast: IMPRESSION: 1. Critically stenotic and nearly occlusive left ICA origin with only a tiny stream of contrast visible. More distal ICA is small, but remains patent. 2. Severe bilateral paraclinoid ICA stenosis. 3. Severe right vertebral artery origin stenosis. 4. Extensive atherosclerosis of the left  common carotid artery with multifocal moderate to severe stenosis. 5. Probable moderate stenosis of the left subclavian artery,although motion limits assessment. 6.  Aortic Atherosclerosis (ICD10-I70.0).   CT Head without Contrast: IMPRESSION: Multiple foci of hypoattenuation and left frontal lobe and basal ganglia concerning for acute or subacute infarcts.     Statin:  Yes.   Beta Blocker:  No. Aspirin:  Yes.   ACEI:  No. ARB:  Yes.   CCB use:  No Other antiplatelets/anticoagulants:  Yes.   Plavix     ASSESSMENT/PLAN: This is a 58 y.o. female who presented with slurred speech found to have acute infarcts in left frontal and basal ganglia. She was outside of TPA window. She has been started on DAPT. CTA head and neck showing critical high grade stenosis of left ICA. MRI and Carotid duplex pending. Recommendation would be for left CEA with possible retrograde common carotid artery stenting. She is not a candidate for left TCAR due to the amount of calcification in her ICA and CCA.  - Continue Plavix, Aspirin, statin - Plan will be for  left CEA with Dr. Chestine Spore on Monday 4/7 - Okay to discharge home prior and will bring her back on Monday for surgery     Graceann Congress PA-C [facility] [phone] 11/08/2023  10:20 AM   I have seen and evaluated the patient. I agree with the PA note as documented above.  58 year old female with hypertension, hyperlipidemia, diabetes that vascular surgery has been consulted for symptomatic left ICA stenosis.  Presents here as a transfer from [facility].  States she had slurred speech on Saturday.  MRI is still pending.  Back to her neurologic baseline.  CTA neck reviewed with high-grade critical stenosis of the left ICA with severely diseased common carotid artery more proximal with extensive atherosclerosis.  Discussed she is not a stent candidate for TCAR due to the diffuse common carotid disease on the left.  Would benefit from left carotid endarterectomy with some chance of retrograde carotid stenting.  Will tentatively schedule for Monday, April 7.  Now on aspirin Plavix statin.  Once she completes her stroke workup she could be discharged in the interim and return Monday for surgery.  Discussed guidelines are to offer carotid revascularization within 2 weeks of event.  I will follow-up with her tomorrow.  Discussed indications for surgery or stroke risk reduction.  Discussed 1% stroke risk along with risk of bleeding cranial nerve injury etc.   Cephus Shelling, MD [facility] Office: [phone]

## 2023-11-15 NOTE — Anesthesia Procedure Notes (Signed)
 Arterial Line Insertion Start/End4/02/2024 7:00 AM, 11/15/2023 7:10 AM Performed by: Gaynelle Adu, MD, Allyn Kenner, CRNA, CRNA  Patient location: Pre-op. Preanesthetic checklist: patient identified, IV checked, site marked, risks and benefits discussed, surgical consent, monitors and equipment checked, pre-op evaluation, timeout performed and anesthesia consent Lidocaine 1% used for infiltration Left, radial was placed Catheter size: 20 G Hand hygiene performed , maximum sterile barriers used  and Seldinger technique used Allen's test indicative of satisfactory collateral circulation Attempts: 1 Procedure performed without using ultrasound guided technique. Following insertion, dressing applied and Biopatch. Post procedure assessment: normal  Patient tolerated the procedure well with no immediate complications.

## 2023-11-16 ENCOUNTER — Encounter (HOSPITAL_COMMUNITY): Payer: Self-pay | Admitting: Vascular Surgery

## 2023-11-16 LAB — CBC
HCT: 35.4 % — ABNORMAL LOW (ref 36.0–46.0)
Hemoglobin: 12 g/dL (ref 12.0–15.0)
MCH: 31.3 pg (ref 26.0–34.0)
MCHC: 33.9 g/dL (ref 30.0–36.0)
MCV: 92.2 fL (ref 80.0–100.0)
Platelets: 170 10*3/uL (ref 150–400)
RBC: 3.84 MIL/uL — ABNORMAL LOW (ref 3.87–5.11)
RDW: 13.5 % (ref 11.5–15.5)
WBC: 11.7 10*3/uL — ABNORMAL HIGH (ref 4.0–10.5)
nRBC: 0 % (ref 0.0–0.2)

## 2023-11-16 LAB — BASIC METABOLIC PANEL WITH GFR
Anion gap: 14 (ref 5–15)
BUN: 47 mg/dL — ABNORMAL HIGH (ref 6–20)
CO2: 15 mmol/L — ABNORMAL LOW (ref 22–32)
Calcium: 8.3 mg/dL — ABNORMAL LOW (ref 8.9–10.3)
Chloride: 107 mmol/L (ref 98–111)
Creatinine, Ser: 2.38 mg/dL — ABNORMAL HIGH (ref 0.44–1.00)
GFR, Estimated: 23 mL/min — ABNORMAL LOW (ref >60)
Glucose, Bld: 116 mg/dL — ABNORMAL HIGH (ref 70–99)
Potassium: 4.2 mmol/L (ref 3.5–5.1)
Sodium: 136 mmol/L (ref 135–145)

## 2023-11-16 LAB — LIPID PANEL
Cholesterol: 75 mg/dL (ref 0–200)
HDL: 22 mg/dL — ABNORMAL LOW (ref >40)
LDL Cholesterol: 32 mg/dL (ref 0–99)
Total CHOL/HDL Ratio: 3.4 ratio
Triglycerides: 106 mg/dL (ref <150)
VLDL: 21 mg/dL (ref 0–40)

## 2023-11-16 LAB — GLUCOSE, CAPILLARY: Glucose-Capillary: 117 mg/dL — ABNORMAL HIGH (ref 70–99)

## 2023-11-16 LAB — POCT ACTIVATED CLOTTING TIME: Activated Clotting Time: 268 s

## 2023-11-16 MED ORDER — OXYCODONE-ACETAMINOPHEN 5-325 MG PO TABS: 1.0000 | ORAL_TABLET | Freq: Four times a day (QID) | ORAL | 0 refills | Status: DC | PRN

## 2023-11-16 NOTE — Progress Notes (Signed)
 Explained discharge instructions to patient. Reviewed follow up appointment and next medication administration times. Also reviewed education. Patient verbalized having an understanding for instructions given. All belongings are in the patient's possession. IV and telemetry were removed. CCMD was notified. No other needs verbalized. Will transport downstairs for discharge.

## 2023-11-16 NOTE — Progress Notes (Addendum)
  Progress Note    11/16/2023 6:41 AM 1 Day Post-Op  Subjective:  no complaints; has voided and walked. No trouble swallowing  Afebrile HR 40's-60's 100's-120's systolic 96% RA  Gtts:  dopamine off   Vitals:   11/16/23 0300 11/16/23 0506  BP: (!) 115/50 (!) 125/56  Pulse: (!) 55 (!) 55  Resp: 16 14  Temp: 98.3 F (36.8 C)   SpO2: 97% 96%     Physical Exam: Neuro:  in tact Lungs:  non labored Incision:  clean and dry  CBC    Component Value Date/Time   WBC 11.7 (H) 11/16/2023 0450   RBC 3.84 (L) 11/16/2023 0450   HGB 12.0 11/16/2023 0450   HGB 15.8 03/05/2016 1127   HCT 35.4 (L) 11/16/2023 0450   HCT 45.7 03/05/2016 1127   PLT 170 11/16/2023 0450   PLT 173 03/05/2016 1127   MCV 92.2 11/16/2023 0450   MCV 95 03/05/2016 1127   MCH 31.3 11/16/2023 0450   MCHC 33.9 11/16/2023 0450   RDW 13.5 11/16/2023 0450   RDW 13.5 03/05/2016 1127   LYMPHSABS 0.9 11/08/2023 0820   MONOABS 0.5 11/08/2023 0820   EOSABS 0.0 11/08/2023 0820   BASOSABS 0.0 11/08/2023 0820    BMET    Component Value Date/Time   NA 136 11/16/2023 0450   NA 139 04/15/2017 1007   K 4.2 11/16/2023 0450   CL 107 11/16/2023 0450   CO2 15 (L) 11/16/2023 0450   GLUCOSE 116 (H) 11/16/2023 0450   BUN 47 (H) 11/16/2023 0450   BUN 16 04/15/2017 1007   CREATININE 2.38 (H) 11/16/2023 0450   CREATININE 1.29 (H) 05/27/2017 0831   CALCIUM 8.3 (L) 11/16/2023 0450   GFRNONAA 23 (L) 11/16/2023 0450   GFRNONAA 48 (L) 05/27/2017 0831   GFRAA 55 (L) 05/27/2017 0831     Intake/Output Summary (Last 24 hours) at 11/16/2023 0641 Last data filed at 11/16/2023 0600 Gross per 24 hour  Intake 450 ml  Output 1300 ml  Net -850 ml     Assessment/Plan:  This is a 58 y.o. female who is s/p left CEA  1 Day Post-Op  -pt is doing well this am. -creatinine improved to 2.38 from 2.69 with IV hydration -pt neuro exam is in tact -pt has ambulated -pt has voided -f/u with [facility] in 2-3 weeks on Dr. Chestine Spore clinic  day   Doreatha Massed, PA-C [facility] [phone]  I have seen and evaluated the patient. I agree with the PA note as documented above. POD#1 s/p left CEA for symptomatic high grade stenosis.  Looks good this am.  Neuro intact.  Neck looks good.  Plan discharge later today on aspirin, plavix, statin.  Cephus Shelling, MD [facility] [place] Office: [phone]

## 2023-11-16 NOTE — Discharge Summary (Signed)
 Discharge Summary     Erica Graham 13-Oct-1965 58 y.o. female  [id number]  Admission Date: 11/15/2023  Discharge Date: 11/16/2023  Physician: Cephus Shelling, MD  Admission Diagnosis: Carotid artery stenosis with cerebral infarction [facility]) [I63.239] Carotid stenosis [I65.29] Symptomatic carotid artery stenosis, left [I65.22]   HPI:   This is a 58 y.o. female  who presents with left symptomatic high grade carotid stenosis >80%.  I discussed with the patient the risks, benefits, and alternatives to carotid endarterectomy.  Due to extensive calcification in the common carotid artery she is not a candidate for stenting. I discussed the procedural details of carotid endarterectomy with the patient.  The patient is aware that the risks of carotid endarterectomy include but are not limited to: bleeding, infection, stroke, myocardial infarction, death, cranial nerve injuries both temporary and permanent, neck hematoma, possible airway compromise, labile blood pressure post-operatively, cerebral hyperperfusion syndrome, and possible need for additional interventions in the future. The patient is aware of the risks and agrees to proceed forward with the procedure.   Hospital Course:  The patient was admitted to the hospital and taken to the operating room on 11/15/2023 and underwent left CEA    Findings: Extensive calcification in the left carotid artery with high-grade proximal internal carotid artery stenosis with ruptured plaque and subacute thrombus.  After endarterectomy and bovine patch angioplasty there were excellent Doppler signals in the internal and external carotid artery.  No flap visualized on intraoperative duplex.   The pt tolerated the procedure well and was transported to the PACU in good condition.   By POD 1, s/p left CEA for symptomatic high grade stenosis. Looks good this am. Neuro intact. Neck looks good. Plan discharge later today on aspirin, plavix, statin.     Recent Labs    11/15/23 0625 11/16/23 0450  NA 135 136  K 4.4 4.2  CL 106 107  CO2 16* 15*  GLUCOSE 158* 116*  BUN 50* 47*  CALCIUM 9.3 8.3*   Recent Labs    11/15/23 0625 11/16/23 0450  WBC 9.5 11.7*  HGB 15.7* 12.0  HCT 46.2* 35.4*  PLT 199 170   Recent Labs    11/15/23 0625  INR 1.1     Discharge Instructions     Discharge patient   Complete by: As directed    Discharge disposition: 01-Home or Self Care   Discharge patient date: 11/16/2023       Discharge Diagnosis:  Carotid artery stenosis with cerebral infarction [facility]) [I63.239] Carotid stenosis [I65.29] Symptomatic carotid artery stenosis, left [I65.22]  Secondary Diagnosis: Patient Active Problem List   Diagnosis Date Noted   Carotid stenosis 11/15/2023   Symptomatic carotid artery stenosis, left 11/15/2023   Essential hypertension 11/08/2023   Controlled type 2 diabetes mellitus without complication, without long-term current use of insulin (HCC) 11/08/2023   Acute kidney injury superimposed on chronic kidney disease (HCC) 11/08/2023   Acute CVA (cerebrovascular accident) (HCC) 11/07/2023   Abdominal aortic atherosclerosis (HCC) 06/03/2017   Primary osteoarthritis of both hands 04/08/2017   Primary osteoarthritis of both feet 04/08/2017   Primary osteoarthritis of both knees 04/08/2017   scoliosis, lumbar region 04/08/2017   DDD (degenerative disc disease), lumbar 04/08/2017   Positive ANA (antinuclear antibody) 04/08/2017   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   Smoker 03/04/2017   Rheumatoid arthritis (HCC) 12/03/2016   Vitamin D deficiency 10/15/2016   Tubular adenoma of colon 08/21/2016   Fibromyalgia 05/28/2016   Type 2 diabetes, HbA1c goal < 7% (HCC)  04/30/2016   Mixed hyperlipidemia 04/30/2016   Essential hypertension, benign 04/30/2016   Morbid obesity due to excess calories (HCC) 04/30/2016   Anxiety 03/05/2016   Varicose vein of leg 03/05/2016   Vitiligo 03/05/2016   Past Medical  History:  Diagnosis Date   Acute CVA (cerebrovascular accident) (HCC) 11/07/2023   Anxiety 03/05/2016   Arthritis    rheumatoid   Asthma    Body aches 03/05/2016   Depression    Fibromyalgia    GERD (gastroesophageal reflux disease)    Hyperlipidemia    Hypertension    Obesity    Recent onset of diabetes mellitus (HCC) 03/10/2016   Referred to Dr Fransico Him and rx metformin 500 mg bid and zocor 20 mg 1 daily   Varicose vein of leg 03/05/2016   Vitiligo 03/05/2016    Allergies as of 11/16/2023       Reactions   Lipitor [atorvastatin] Other (See Comments)   Myalgia and joint pain        Medication List     TAKE these medications    albuterol 108 (90 Base) MCG/ACT inhaler Commonly known as: VENTOLIN HFA Inhale 2 puffs into the lungs every 6 (six) hours as needed for wheezing or shortness of breath.   amLODipine 5 MG tablet Commonly known as: NORVASC Take 1 tablet (5 mg total) by mouth daily.   aspirin EC 81 MG tablet Take 1 tablet (81 mg total) by mouth daily. Swallow whole.   clopidogrel 75 MG tablet Commonly known as: PLAVIX Take 1 tablet (75 mg total) by mouth daily.   diphenhydrAMINE 25 MG tablet Commonly known as: BENADRYL Take 50 mg by mouth at bedtime as needed for sleep.   Jardiance 10 MG Tabs tablet Generic drug: empagliflozin Take 1 tablet (10 mg total) by mouth daily before breakfast.   nicotine 21 mg/24hr patch Commonly known as: NICODERM CQ - dosed in mg/24 hours Place 1 patch (21 mg total) onto the skin daily.   oxyCODONE-acetaminophen 5-325 MG tablet Commonly known as: Percocet Take 1 tablet by mouth every 6 (six) hours as needed for severe pain (pain score 7-10).   pantoprazole 40 MG tablet Commonly known as: Protonix Take 1 tablet (40 mg total) by mouth daily.   rosuvastatin 20 MG tablet Commonly known as: CRESTOR Take 1 tablet (20 mg total) by mouth daily.   SYSTANE OP Apply 1 drop to eye as needed.   Vitamin D3 125 MCG (5000 UT) Caps Take  1 capsule (5,000 Units total) by mouth daily.         [facility] Discharge Instructions Carotid Endarterectomy (CEA)  Please refer to the following instructions for your post-procedure care. Your surgeon or physician assistant will discuss any changes with you.  Activity  You are encouraged to walk as much as you can. You can slowly return to normal activities but must avoid strenuous activity and heavy lifting until your doctor tell you it's OK. Avoid activities such as vacuuming or swinging a golf club. You can drive after one week if you are comfortable and you are no longer taking prescription pain medications. It is normal to feel tired for serval weeks after your surgery. It is also normal to have difficulty with sleep habits, eating, and bowel movements after surgery. These will go away with time.  Bathing/Showering  You may shower after you come home. Do not soak in a bathtub, hot tub, or swim until the incision heals completely.  Incision Care  Shower every day. Clean your incision with mild soap and water. Pat the area dry with a clean towel. You do not need a bandage unless otherwise instructed. Do not apply any ointments or creams to your incision. You may have skin glue on your incision. Do not peel it off. It will come off on its own in about one week. Your incision may feel thickened and raised for several weeks after your surgery. This is normal and the skin will soften over time. For Men Only: It's OK to shave around the incision but do not shave the incision itself for 2 weeks. It is common to have numbness under your chin that could last for several months.  Diet  Resume your normal diet. There are no special food restrictions following this procedure. A low fat/low cholesterol diet is recommended for all patients with vascular disease. In order to heal from your surgery, it is CRITICAL to get adequate nutrition. Your body requires  vitamins, minerals, and protein. Vegetables are the best source of vitamins and minerals. Vegetables also provide the perfect balance of protein. Processed food has little nutritional value, so try to avoid this.  Medications  Resume taking all of your medications unless your doctor or physician assistant tells you not to.  If your incision is causing pain, you may take over-the- counter pain relievers such as acetaminophen (Tylenol). If you were prescribed a stronger pain medication, please be aware these medications can cause nausea and constipation.  Prevent nausea by taking the medication with a snack or meal. Avoid constipation by drinking plenty of fluids and eating foods with a high amount of fiber, such as fruits, vegetables, and grains.  Do not take Tylenol if you are taking prescription pain medications.  Follow Up  Our office will schedule a follow up appointment 2-3 weeks following discharge.  Please call us immediately for any of the following conditions  Increased pain, redness, drainage (pus) from your incision site. Fever of 101 degrees or higher. If you should develop stroke (slurred speech, difficulty swallowing, weakness on one side of your body, loss of vision) you should call 911 and go to the nearest emergency room.  Reduce your risk of vascular disease:  Stop smoking. If you would like help call [facility] at 1-800-QUIT-NOW (539-218-6911) or  at [phone]. Manage your cholesterol Maintain a desired weight Control your diabetes Keep your blood pressure down  If you have any questions, please call the office at [phone].  Prescriptions given: 1.   Roxicet #8 No Refill  Disposition: home  Patient's condition: is Good  Follow up: 1. [facility] in 2-3 weeks for incision check   Doreatha Massed, PA-C [facility] [phone]   --- For [facility] Registry use ---   Modified Rankin score at D/C (0-6): 0  IV medication needed for:   1. Hypertension: No 2. Hypotension: Yes  Post-op Complications: No  1. Post-op CVA or TIA: No  If yes: Event classification (right eye, left eye, right cortical, left cortical, verterobasilar, other): n/a  If yes: Timing of event (intra-op, <6 hrs post-op, >=6 hrs post-op, unknown): n/a  2. CN injury: No  If yes: CN n/a injuried   3. Myocardial infarction: No  If yes: Dx by (EKG or clinical, Troponin): n/a  4.  CHF: No  5.  Dysrhythmia (new): No  6. Wound infection: No  7. Reperfusion symptoms: No  8. Return to OR: No  If yes: return to OR for (bleeding, neurologic, other  CEA incision, other): n/a  Discharge medications: Statin use:  Yes ASA use:  Yes   Beta blocker use:  No ACE-Inhibitor use:  No  ARB use:  No CCB use: Yes P2Y12 Antagonist use: Yes, [ ]  Plavix, [ ]  Plasugrel, [ ]  Ticlopinine, [ ]  Ticagrelor, [ ]  Other, [ ]  No for medical reason, [ ]  Non-compliant, [ ]  Not-indicated Anti-coagulant use:  No, [ ]  Warfarin, [ ]  Rivaroxaban, [ ]  Dabigatran,

## 2023-11-17 LAB — POCT ACTIVATED CLOTTING TIME: Activated Clotting Time: 349 s

## 2023-11-18 ENCOUNTER — Telehealth: Payer: Self-pay

## 2023-11-18 NOTE — Telephone Encounter (Signed)
 LVM for patient to call back and get scheduled with Darral Dash, NP.

## 2023-11-18 NOTE — Telephone Encounter (Signed)
 Copied from CRM [id number]. Topic: General - Other >> Nov 17, 2023 12:31 PM Emylou G wrote: Reason for CRM: Pls call patient [phone] - looking to be seen sooner due to meds needing to be filled?  Preferable in the next month

## 2023-12-06 NOTE — Progress Notes (Unsigned)
 POST OPERATIVE OFFICE NOTE    CC:  F/u for surgery  HPI:  This is a 58 y.o. female who is s/p left CEA for symptomatic carotid artery stenosis on 11/15/2023 by Dr. Fulton Job.   She was discharged on POD 1.  Pt returns today for follow up.  Pt states she has done well since surgery.  She denies any speech issues, unilateral weakness, numbness or paralysis.  She is compliant with her statin, plavix  and asa.   She states that she has not had a PCP and has appt in June.  She requests refills for her medication.    She has quit smoking.     Allergies  Allergen Reactions   Lipitor [Atorvastatin] Other (See Comments)    Myalgia and joint pain    Current Outpatient Medications  Medication Sig Dispense Refill   albuterol  (PROVENTIL  HFA;VENTOLIN  HFA) 108 (90 Base) MCG/ACT inhaler Inhale 2 puffs into the lungs every 6 (six) hours as needed for wheezing or shortness of breath. 1 Inhaler 0   amLODipine  (NORVASC ) 5 MG tablet Take 1 tablet (5 mg total) by mouth daily. 30 tablet 0   aspirin  EC 81 MG tablet Take 1 tablet (81 mg total) by mouth daily. Swallow whole. 30 tablet 0   Cholecalciferol  (VITAMIN D3) 5000 units CAPS Take 1 capsule (5,000 Units total) by mouth daily. 90 capsule 0   clopidogrel  (PLAVIX ) 75 MG tablet Take 1 tablet (75 mg total) by mouth daily. 30 tablet 0   diphenhydrAMINE (BENADRYL) 25 MG tablet Take 50 mg by mouth at bedtime as needed for sleep.  (Patient not taking: Reported on 11/08/2023)     empagliflozin  (JARDIANCE ) 10 MG TABS tablet Take 1 tablet (10 mg total) by mouth daily before breakfast. 30 tablet 0   nicotine  (NICODERM CQ  - DOSED IN MG/24 HOURS) 21 mg/24hr patch Place 1 patch (21 mg total) onto the skin daily.     oxyCODONE -acetaminophen  (PERCOCET) 5-325 MG tablet Take 1 tablet by mouth every 6 (six) hours as needed for severe pain (pain score 7-10). 8 tablet 0   pantoprazole  (PROTONIX ) 40 MG tablet Take 1 tablet (40 mg total) by mouth daily. 30 tablet 0   Polyethyl  Glycol-Propyl Glycol (SYSTANE OP) Apply 1 drop to eye as needed.  (Patient not taking: Reported on 11/08/2023)     rosuvastatin  (CRESTOR ) 20 MG tablet Take 1 tablet (20 mg total) by mouth daily. 30 tablet 0   No current facility-administered medications for this visit.     ROS:  See HPI  Physical Exam:  Today's Vitals   12/07/23 0936 12/07/23 0938  BP: (!) 171/109 (!) 152/98  Pulse: 70   Resp: 18   Temp: 97.8 F (36.6 C)   TempSrc: Temporal   SpO2: 96%   Weight:  240 lb (108.9 kg)  Height:  5\' 6"  (1.676 m)  PainSc: 0-No pain    Body mass index is 38.74 kg/m.   Incision:  well healed Extremities:  moving all extremities equally Neuro: in tact     Assessment/Plan:  This is a 58 y.o. female who is s/p: left CEA for symptomatic carotid artery stenosis on 11/15/2023 by Dr. Fulton Job.   She was discharged on POD 1.  -pt doing well and neuro in tact.  No further issues with word finding.   -her incision is healing nicely.   -continue asa/statin/plavix .   She has appt with neuro next week.  Ok to discontinue plavix  from vascular standpoint and will defer to  them if she needs to continue.   -she will f/u in 9 months with carotid duplex.  Discussed small possibility of restenosis in the first year.  She will call us  sooner if any issues before then.  -fortunately, she has quit smoking! Praised her for this and encouraged her to continue the good work!! -discussed s/s of stroke and if she has any of those, she should go to the ER.  -of note, she has appt with new PCP in June, which was the first appt they had.  I did refill her Norvasc , Protonix , Plavix , Jardiance , Crestor  with 30 tablets and one refill to get her to that appt.    Maryanna Smart, [facility] [phone]   Clinic MD:  Fulton Job

## 2023-12-07 ENCOUNTER — Encounter: Payer: Self-pay | Admitting: Physician Assistant

## 2023-12-07 ENCOUNTER — Ambulatory Visit: Attending: Vascular Surgery | Admitting: Physician Assistant

## 2023-12-07 VITALS — BP 152/98 | HR 70 | Temp 97.8°F | Resp 18 | Ht 66.0 in | Wt 240.0 lb

## 2023-12-07 DIAGNOSIS — I6523 Occlusion and stenosis of bilateral carotid arteries: Secondary | ICD-10-CM

## 2023-12-07 DIAGNOSIS — I6522 Occlusion and stenosis of left carotid artery: Secondary | ICD-10-CM

## 2023-12-07 MED ORDER — AMLODIPINE BESYLATE 5 MG PO TABS: 5.0000 mg | ORAL_TABLET | Freq: Every day | ORAL | 1 refills | Status: DC

## 2023-12-07 MED ORDER — EMPAGLIFLOZIN 10 MG PO TABS: 10.0000 mg | ORAL_TABLET | Freq: Every day | ORAL | 1 refills | Status: DC

## 2023-12-07 MED ORDER — ROSUVASTATIN CALCIUM 20 MG PO TABS
20.0000 mg | ORAL_TABLET | Freq: Every day | ORAL | 1 refills | Status: DC
Start: 2023-12-07 — End: 2024-01-20

## 2023-12-07 MED ORDER — CLOPIDOGREL BISULFATE 75 MG PO TABS: 75.0000 mg | ORAL_TABLET | Freq: Every day | ORAL | 1 refills | Status: DC

## 2023-12-07 MED ORDER — PANTOPRAZOLE SODIUM 40 MG PO TBEC: 40.0000 mg | DELAYED_RELEASE_TABLET | Freq: Every day | ORAL | 1 refills | Status: DC

## 2023-12-16 ENCOUNTER — Encounter: Payer: Self-pay | Admitting: Adult Health

## 2023-12-16 ENCOUNTER — Ambulatory Visit: Admitting: Adult Health

## 2023-12-16 VITALS — BP 146/86 | HR 66 | Ht 66.0 in | Wt 239.0 lb

## 2023-12-16 DIAGNOSIS — Z87891 Personal history of nicotine dependence: Secondary | ICD-10-CM

## 2023-12-16 DIAGNOSIS — I63232 Cerebral infarction due to unspecified occlusion or stenosis of left carotid arteries: Secondary | ICD-10-CM | POA: Diagnosis not present

## 2023-12-16 DIAGNOSIS — I6522 Occlusion and stenosis of left carotid artery: Secondary | ICD-10-CM

## 2023-12-16 NOTE — Patient Instructions (Addendum)
 Continue aspirin  81 mg daily  and Crestor   for secondary stroke prevention  Can stop plavix  at this time  Continue to monitor blood pressure at home and ensure you are writing down numbers. Ensure you are sitting and resting for at least 15 minutes prior to checking, ensure your feet are flat on the ground without being crossed and rest your arm at heart level for most accurate readings   Continue to follow with vascular surgery as advised  Congratulations on quitting smoking!  Please ensure you continue to refrain from smoking!   Continue to follow up with PCP regarding blood pressure, cholesterol and diabetes management  Maintain strict control of hypertension with blood pressure goal below 130/90, diabetes with hemoglobin A1c goal below 7.0 % and cholesterol with LDL cholesterol (bad cholesterol) goal below 70 mg/dL.   Signs of a Stroke? Follow the BEFAST method:  Balance Watch for a sudden loss of balance, trouble with coordination or vertigo Eyes Is there a sudden loss of vision in one or both eyes? Or double vision?  Face: Ask the person to smile. Does one side of the face droop or is it numb?  Arms: Ask the person to raise both arms. Does one arm drift downward? Is there weakness or numbness of a leg? Speech: Ask the person to repeat a simple phrase. Does the speech sound slurred/strange? Is the person confused ? Time: If you observe any of these signs, call 911.        Thank you for coming to see us  at [facility]. I hope we have been able to provide you high quality care today.  You may receive a patient satisfaction survey over the next few weeks. We would appreciate your feedback and comments so that we may continue to improve ourselves and the health of our patients.    Stroke Prevention Some medical conditions and lifestyle choices can lead to a higher risk for a stroke. You can help to prevent a stroke by eating healthy foods and exercising. It also  helps to not smoke and to manage any health problems you may have. How can this condition affect me? A stroke is an emergency. It should be treated right away. A stroke can lead to brain damage or threaten your life. There is a better chance of surviving and getting better after a stroke if you get medical help right away. What can increase my risk? The following medical conditions may increase your risk of a stroke: Diseases of the heart and blood vessels (cardiovascular disease). High blood pressure (hypertension). Diabetes. High cholesterol. Sickle cell disease. Problems with blood clotting. Being very overweight. Sleeping problems (obstructivesleep apnea). Other risk factors include: Being older than age 25. A history of blood clots, stroke, or mini-stroke (TIA). Race, ethnic background, or a family history of stroke. Smoking or using tobacco products. Taking birth control pills, especially if you smoke. Heavy alcohol  and drug use. Not being active. What actions can I take to prevent this? Manage your health conditions High cholesterol. Eat a healthy diet. If this is not enough to manage your cholesterol, you may need to take medicines. Take medicines as told by your doctor. High blood pressure. Try to keep your blood pressure below 130/80. If your blood pressure cannot be managed through a healthy diet and regular exercise, you may need to take medicines. Take medicines as told by your doctor. Ask your doctor if you should check your blood pressure at home. Have your blood pressure  checked every year. Diabetes. Eat a healthy diet and get regular exercise. If your blood sugar (glucose) cannot be managed through diet and exercise, you may need to take medicines. Take medicines as told by your doctor. Talk to your doctor about getting checked for sleeping problems. Signs of a problem can include: Snoring a lot. Feeling very tired. Make sure that you manage any other conditions  you have. Nutrition  Follow instructions from your doctor about what to eat or drink. You may be told to: Eat and drink fewer calories each day. Limit how much salt (sodium) you use to 1,500 milligrams (mg) each day. Use only healthy fats for cooking, such as olive oil, canola oil, and sunflower oil. Eat healthy foods. To do this: Choose foods that are high in fiber. These include whole grains, and fresh fruits and vegetables. Eat at least 5 servings of fruits and vegetables a day. Try to fill one-half of your plate with fruits and vegetables at each meal. Choose low-fat (lean) proteins. These include low-fat cuts of meat, chicken without skin, fish, tofu, beans, and nuts. Eat low-fat dairy products. Avoid foods that: Are high in salt. Have saturated fat. Have trans fat. Have cholesterol. Are processed or pre-made. Count how many carbohydrates you eat and drink each day. Lifestyle If you drink alcohol : Limit how much you have to: 0-1 drink a day for women who are not pregnant. 0-2 drinks a day for men. Know how much alcohol  is in your drink. In the [place] one drink equals one 12 oz bottle of beer ( ), one 5 oz glass of wine ( ), or one 1 oz glass of hard liquor (44mL). Do not smoke or use any products that have nicotine  or tobacco. If you need help quitting, ask your doctor. Avoid secondhand smoke. Do not use drugs. Activity  Try to stay at a healthy weight. Get at least 30 minutes of exercise on most days, such as: Fast walking. Biking. Swimming. Medicines Take over-the-counter and prescription medicines only as told by your doctor. Avoid taking birth control pills. Talk to your doctor about the risks of taking birth control pills if: You are over 76 years old. You smoke. You get very bad headaches. You have had a blood clot. Where to find more information American Stroke Association: www.strokeassociation.org Get help right away if: You or a loved one has any  signs of a stroke. "BE FAST" is an easy way to remember the warning signs: B - Balance. Dizziness, sudden trouble walking, or loss of balance. E - Eyes. Trouble seeing or a change in how you see. F - Face. Sudden weakness or loss of feeling of the face. The face or eyelid may droop on one side. A - Arms. Weakness or loss of feeling in an arm. This happens all of a sudden and most often on one side of the body. S - Speech. Sudden trouble speaking, slurred speech, or trouble understanding what people say. T - Time. Time to call emergency services. Write down what time symptoms started. You or a loved one has other signs of a stroke, such as: A sudden, very bad headache with no known cause. Feeling like you may vomit (nausea). Vomiting. A seizure. These symptoms may be an emergency. Get help right away. Call your local emergency services (911 in the [place] Do not wait to see if the symptoms will go away. Do not drive yourself to the hospital. Summary You can help to prevent a stroke by eating healthy,  exercising, and not smoking. It also helps to manage any health problems you have. Do not smoke or use any products that contain nicotine  or tobacco. Get help right away if you or a loved one has any signs of a stroke. This information is not intended to replace advice given to you by your health care provider. Make sure you discuss any questions you have with your health care provider. Document Revised: 06/29/2022 Document Reviewed: 06/29/2022 Elsevier Patient Education  2024 [employer].

## 2023-12-16 NOTE — Progress Notes (Signed)
 [facility] [address] [place]. [place] [address] [phone]       HOSPITAL FOLLOW UP NOTE  Ms. MALISSIE SCHOLZ Date of Birth:  [birth year] Medical Record Number:  [id number]   Reason for Referral:  hospital stroke follow up    SUBJECTIVE:   CHIEF COMPLAINT:  Chief Complaint  Patient presents with   Cerebrovascular Accident    Rm 3 alone Pt is well and stable, reports no residual CVA concerns.     HPI:   Ms. ANALIE KICE is a 58 y.o. female with history of prior stroke, depression, diabetes, GERD, hyperlipidemia, hypertension, obesity, daily smoker who presented to ED on 11/07/2023 with aphasia and mild right-sided weakness.  Stroke workup revealed left frontal and BG infarct likely secondary to large vessel disease from left ICA bulb and siphon severe stenosis (see imaging below).  Evaluated by vascular surgery who recommended CEA outpatient.  Placed on DAPT and Crestor  20 mg daily.  LDL 100, A1c 9.1.  noted new dx of DM, started on Jardiance . Current tobacco use with smoking cessation counseling provided.  No therapy needs.  Scheduled for for CEA 4/7.    Today, 12/16/2023, patient is being seen for initial hospital follow-up unaccompanied.  Reports overall doing well since discharge.  Denies residual deficits and has since returned back to all prior activities.  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 Denies new stroke/TIA symptoms. Denies fatigue prior to stroke, denies insomnia or nocturia, no snoring or witnessed apneas, no morning headaches.  Underwent L CEA 4/7 without complication and remained on DAPT and statin.  Had follow-up with [facility] last week, cleared to d/c plavix  from vascular standpoint but advised to discuss ongoing need of DAPT today.  Surgical incision healing well.  Plans on repeat carotid ultrasound in 9 months.  She has remained on  aspirin , Plavix  and Crestor  without side effects.  Does report fluctuation of glucose levels and blood pressure, no clear cause for fluctuation.  She is scheduled to establish care with PCP in July.  She reports complete tobacco cessation since discharge      PERTINENT IMAGING  Code Stroke CT head Multiple foci of hypoattenuation and left frontal lobe basal ganglia    CTA head & neck Critically stenotic and nearly occlusive left ICA origin with only a tiny stream of contrast visible. More distal ICA is small, but remains patent. Severe bilateral paraclinoid ICA stenosis. Severe right vertebral artery origin stenosis. Extensive  atherosclerosis of the left common carotid artery with multifocal moderate to severe stenosis. MRI  Multifocal acute infarcts within the left frontal lobe and left basal ganglia, predominantly ACA territory. No hemorrhage. 2D Echo EF 55 to 60%, no PFO Carotid US  concerning for left ICA occlusion left ACA retrograde flow Carotid angiography - 50% stenosis at the ostia of the CCA. retrograde filling of the ECA, with antegrade filling of the ICA which is greater than 99% stenosis.  Widely patent distally. LDL 100 HgbA1c 9.1 UDS negative    ROS:   14 system review of systems performed and negative with exception of those listed in HPI  PMH:  Past Medical History:  Diagnosis Date   Acute CVA (cerebrovascular accident) (HCC) 11/07/2023   Anxiety 03/05/2016   Arthritis    rheumatoid   Asthma    Body aches 03/05/2016   Depression    Fibromyalgia  GERD (gastroesophageal reflux disease)    Hyperlipidemia    Hypertension    Obesity    Recent onset of diabetes mellitus (HCC) 03/10/2016   Referred to Dr Monte Antonio and rx metformin  500 mg bid and zocor  20 mg 1 daily   Varicose vein of leg 03/05/2016   Vitiligo 03/05/2016    PSH:  Past Surgical History:  Procedure Laterality Date   CAROTID ANGIOGRAPHY N/A 11/10/2023   Procedure: CAROTID ANGIOGRAPHY;  Surgeon: Kayla Part, MD;  Location: [facility] INVASIVE CV LAB;  Service: Cardiovascular;  Laterality: N/A;   CHOLECYSTECTOMY     COLONOSCOPY N/A 08/20/2016   Procedure: COLONOSCOPY;  Surgeon: Ruby Corporal, MD;  Location: [facility] ENDO SUITE;  Service: Endoscopy;  Laterality: N/A;  830    DILATION AND CURETTAGE OF UTERUS     ENDARTERECTOMY Left 11/15/2023   Procedure: ENDARTERECTOMY, CAROTID;  Surgeon: Young Hensen, MD;  Location: [facility] OR;  Service: Vascular;  Laterality: Left;   GALLBLADDER SURGERY     LEG SURGERY Left    broke leg   PATCH ANGIOPLASTY  11/15/2023   Procedure: ANGIOPLASTY, USING PATCH GRAFT;  Surgeon: Young Hensen, MD;  Location: [facility] OR;  Service: Vascular;;   POLYPECTOMY  08/20/2016   Procedure: POLYPECTOMY;  Surgeon: Ruby Corporal, MD;  Location: [facility] ENDO SUITE;  Service: Endoscopy;;  colon   TUBAL LIGATION      Social History:  Social History   Socioeconomic History   Marital status: Divorced    Spouse name: Not on file   Number of children: 2   Years of education: 13   Highest education level: Not on file  Occupational History   Occupation: hairdresser    Comment: self  Tobacco Use   Smoking status: Former    Current packs/day: 0.00    Average packs/day: 1 pack/day for 20.0 years (20.0 ttl pk-yrs)    Types: Cigarettes    Quit date: 11/06/2023    Years since quitting: 0.1   Smokeless tobacco: Never  Vaping Use   Vaping status: Never Used  Substance and Sexual Activity   Alcohol  use: Yes    Comment: occ 1 monthly    Drug use: No   Sexual activity: Not Currently    Birth control/protection: Surgical    Comment: tubal  Other Topics Concern   Not on file  Social History Narrative   Lives at home with two children, son is 37, daughter is 48   Social Drivers of Corporate investment banker Strain: Not on file  Food Insecurity: No Food Insecurity (11/08/2023)   Hunger Vital Sign    Worried About Running Out of Food in the Last Year: Never true    Ran Out of Food  in the Last Year: Never true  Transportation Needs: No Transportation Needs (11/08/2023)   PRAPARE - Administrator, Civil Service (Medical): No    Lack of Transportation (Non-Medical): No  Physical Activity: Not on file  Stress: Not on file  Social Connections: Not on file  Intimate Partner Violence: Not At Risk (11/08/2023)   Humiliation, Afraid, Rape, and Kick questionnaire    Fear of Current or Ex-Partner: No    Emotionally Abused: No    Physically Abused: No    Sexually Abused: No    Family History:  Family History  Problem Relation Age of Onset   Cancer Paternal Grandfather        prostate   Diabetes Paternal Grandmother  Emphysema Maternal Grandmother    Cancer Maternal Grandmother        lung   Alcohol  abuse Maternal Grandfather    Diabetes Father    Hypertension Mother    Diabetes Mother    Crohn's disease Sister     Medications:   Current Outpatient Medications on File Prior to Visit  Medication Sig Dispense Refill   albuterol  (PROVENTIL  HFA;VENTOLIN  HFA) 108 (90 Base) MCG/ACT inhaler Inhale 2 puffs into the lungs every 6 (six) hours as needed for wheezing or shortness of breath. 1 Inhaler 0   amLODipine  (NORVASC ) 5 MG tablet Take 1 tablet (5 mg total) by mouth daily. 30 tablet 1   aspirin  EC 81 MG tablet Take 1 tablet (81 mg total) by mouth daily. Swallow whole. 30 tablet 0   diphenhydrAMINE (BENADRYL) 25 MG tablet Take 50 mg by mouth at bedtime as needed for sleep.     empagliflozin  (JARDIANCE ) 10 MG TABS tablet Take 1 tablet (10 mg total) by mouth daily before breakfast. 30 tablet 1   pantoprazole  (PROTONIX ) 40 MG tablet Take 1 tablet (40 mg total) by mouth daily. 30 tablet 1   Polyethyl Glycol-Propyl Glycol (SYSTANE OP) Apply 1 drop to eye as needed.     rosuvastatin  (CRESTOR ) 20 MG tablet Take 1 tablet (20 mg total) by mouth daily. 30 tablet 1   No current facility-administered medications on file prior to visit.    Allergies:   Allergies   Allergen Reactions   Lipitor [Atorvastatin] Other (See Comments)    Myalgia and joint pain      OBJECTIVE:  Physical Exam  Vitals:   12/16/23 1002 12/16/23 1005 12/16/23 1023  BP: (!) 186/91 (!) 162/91 (!) 146/86  Pulse: 66 66   Weight: 239 lb (108.4 kg)    Height: 5\' 6"  (1.676 m)     Body mass index is 38.58 kg/m. No results found.  General: well developed, well nourished, very pleasant middle-aged Caucasian female, seated, in no evident distress Head: head normocephalic and atraumatic.   Neck: supple with no carotid or supraclavicular bruits Cardiovascular: regular rate and rhythm, no murmurs Musculoskeletal: no deformity Skin:  L CEA incision healing well Vascular:  Normal pulses all extremities   Neurologic Exam Mental Status: Awake and fully alert.  Fluent speech and language.  Oriented to place and time. Recent and remote memory intact. Attention span, concentration and fund of knowledge appropriate. Mood and affect appropriate.  Cranial Nerves: Pupils equal, briskly reactive to light. Extraocular movements full without nystagmus. Visual fields full to confrontation. Hearing intact. Facial sensation intact. Face, tongue, palate moves normally and symmetrically.  Motor: Normal bulk and tone. Normal strength in all tested extremity muscles Sensory.: intact to touch , pinprick , position and vibratory sensation.  Coordination: Rapid alternating movements normal in all extremities. Finger-to-nose and heel-to-shin performed accurately bilaterally. Gait and Station: Arises from chair without difficulty. Stance is normal. Gait demonstrates normal stride length and balance without use of AD.   Reflexes: 1+ and symmetric. Toes downgoing.     NIHSS  0 Modified Rankin  0      ASSESSMENT: TORII RAKESTRAW is a 58 y.o. year old female with left frontal and BG infarct on 11/07/2023 likely due to large vessel disease from severe left ICA bulb and siphon stenosis. Vascular risk  factors include carotid artery stenosis, HTN, HLD, uncontrolled DM, tobacco use and obesity.      PLAN:  Left frontal and BG stroke:  Recovered well without  residual deficit.  Has since returned back to all prior activities. C/o fatigue, likely post stroke, should continue to gradually improve over the next couple of months. If persists, would advise to discuss further with PCP Continue aspirin  81mg  daily and rosuvastatin  (Crestor ) 20 mg daily for secondary stroke prevention managed/prescribed by PCP.  Okay to d/c Plavix  as no indication for prolonged DAPT from stroke standpoint Discussed secondary stroke prevention measures and importance of establishing care with PCP as scheduled in July for routine follow up for aggressive stroke risk factor management including BP goal<130/90, HLD with LDL goal<70 and DM with A1c.<7 .  Stroke labs 10/2023: LDL 100, A1c 9.1 I have gone over the pathophysiology of stroke, warning signs and symptoms, risk factors and their management in some detail with instructions to go to the closest emergency room for symptoms of concern.  Carotid stenosis: S/p L CEA 11/15/2023 without complication On DAPT and statin postprocedure, cleared to discontinue Plavix  and remain on aspirin  alone Plans on f/u with [facility] in 9 months for repeat carotid duplex  Tobacco use: Reports complete cessation since discharge and was congratulated on this achievement!  Discussed importance of continued tobacco cessation    No further recommendations from stroke standpoint and will be establishing care with PCP in 2 months.  She can follow-up on an as-needed basis at this time but advised to call with any questions or concerns in the future   CC:  [facility] provider: Dr. Janett Medin PCP: Pcp, No    I spent 54 minutes of face-to-face and non-face-to-face time with patient.  This included previsit chart review including extensive review of hospitalization, lab review, study review, order entry,  electronic health record documentation, patient education and discussion regarding above diagnoses and treatment plan and answered all other questions to patient's satisfaction  Johny Nap, [facility]  [facility] [address] [place], [place] [address]  Phone [phone] Fax [phone] Note: This document was prepared with digital dictation and possible smart phrase technology. Any transcriptional errors that result from this process are unintentional.

## 2023-12-16 NOTE — Progress Notes (Signed)
 I agree with the above plan

## 2023-12-17 ENCOUNTER — Telehealth: Payer: Self-pay

## 2023-12-17 NOTE — Telephone Encounter (Signed)
 APPT SCHEDULED

## 2023-12-17 NOTE — Telephone Encounter (Signed)
 Copied from CRM [id number]. Topic: General - Other >> Dec 16, 2023  2:44 PM Santiya F wrote: Reason for CRM: Patient is calling in requesting a call if an appointment comes open sooner due to her not having the mychart app.

## 2024-01-20 ENCOUNTER — Telehealth: Payer: Self-pay

## 2024-01-20 ENCOUNTER — Ambulatory Visit

## 2024-01-20 VITALS — BP 159/92 | HR 82 | Ht 66.0 in | Wt 238.0 lb

## 2024-01-20 DIAGNOSIS — I10 Essential (primary) hypertension: Secondary | ICD-10-CM

## 2024-01-20 DIAGNOSIS — Z7984 Long term (current) use of oral hypoglycemic drugs: Secondary | ICD-10-CM | POA: Diagnosis not present

## 2024-01-20 DIAGNOSIS — I6930 Unspecified sequelae of cerebral infarction: Secondary | ICD-10-CM | POA: Diagnosis not present

## 2024-01-20 DIAGNOSIS — I6522 Occlusion and stenosis of left carotid artery: Secondary | ICD-10-CM

## 2024-01-20 DIAGNOSIS — I639 Cerebral infarction, unspecified: Secondary | ICD-10-CM

## 2024-01-20 DIAGNOSIS — E119 Type 2 diabetes mellitus without complications: Secondary | ICD-10-CM

## 2024-01-20 MED ORDER — EMPAGLIFLOZIN 10 MG PO TABS: 10.0000 mg | ORAL_TABLET | Freq: Every day | ORAL | 5 refills | Status: DC

## 2024-01-20 MED ORDER — ROSUVASTATIN CALCIUM 20 MG PO TABS: 20.0000 mg | ORAL_TABLET | Freq: Every day | ORAL | 5 refills | Status: DC

## 2024-01-20 MED ORDER — AMLODIPINE BESYLATE 10 MG PO TABS: 10.0000 mg | ORAL_TABLET | Freq: Every day | ORAL | 5 refills | Status: DC

## 2024-01-20 NOTE — Assessment & Plan Note (Signed)
 She brought in home blood pressure readings which are consistently staying above 140-150 for SBP. Will increase amlodipine  to 10 mg today. Continue to work on healthy low-sodium diet and weight reduction.  Also recommend smoking cessation.

## 2024-01-20 NOTE — Assessment & Plan Note (Signed)
 She underwent CEA on 4 7.  She continues to take statin and low-dose aspirin .

## 2024-01-20 NOTE — Progress Notes (Signed)
 New Patient Office Visit  Subjective    Patient ID: Erica Graham, female    DOB: [birth year]  Age: 58 y.o. MRN: [id number]  CC:  Chief Complaint  Patient presents with   Establish Care    Pt here to establish care     HPI Erica Graham is a 58 y.o. Caucasian female with medical history significant for anxiety, arthritis, depression, fibromyalgia, GERD, hypertension, dyslipidemia and type 2 diabetes mellitus, who presents to establish care.  [facility] for the following:   Acute CVA (cerebrovascular accident) [facility]) affecting left frontal lobe and basal ganglia.  - Seen by neurology, symptoms mostly dysarthria, signed window of thrombectomy or TNA, full stroke workup, currently on DAPT, has been loaded with Plavix  300 mg x 1 as well.   -She is not on any antithrombotic prior to admission, currently on aspirin  and Plavix  for 70-month giving severe stenosis of ICA pulp and siphon.   -Carotid ultrasound concerning for left ICA occlusion, left ACA retrograde flow, went  for carotid angiogram today by vascular surgery given discrepancy in ultrasound and CT angio to decide if intervention is required.  Workup significant for patent internal carotid artery with greater than 99% stenosis, widely patent distally, so recommendation for endarterectomy on 11/15/2023 to be arranged as an outpatient as discussed with vascular surgery.   Carotid artery stenosis -Carotid ultrasound concerning for left ICA occlusion with left ACA retrograde flow, please see above discussion -Carotid angiography done - 50% stenosis at the ostia of the CCA. retrograde filling of the ECA, with antegrade filling of the ICA which is greater than 99% stenosis. - CEA performed on 4/7 She reports feeling well at this time.  She denies any new sx or s/e from medications.    Outpatient Encounter Medications as of 01/20/2024  Medication Sig   aspirin  EC 81 MG tablet Take 1 tablet (81 mg total)  by mouth daily. Swallow whole.   pantoprazole  (PROTONIX ) 40 MG tablet Take 1 tablet (40 mg total) by mouth daily.   Polyethyl Glycol-Propyl Glycol (SYSTANE OP) Apply 1 drop to eye as needed.   [DISCONTINUED] amLODipine  (NORVASC ) 5 MG tablet Take 1 tablet (5 mg total) by mouth daily.   [DISCONTINUED] empagliflozin  (JARDIANCE ) 10 MG TABS tablet Take 1 tablet (10 mg total) by mouth daily before breakfast.   [DISCONTINUED] rosuvastatin  (CRESTOR ) 20 MG tablet Take 1 tablet (20 mg total) by mouth daily.   amLODipine  (NORVASC ) 10 MG tablet Take 1 tablet (10 mg total) by mouth daily.   empagliflozin  (JARDIANCE ) 10 MG TABS tablet Take 1 tablet (10 mg total) by mouth daily before breakfast.   rosuvastatin  (CRESTOR ) 20 MG tablet Take 1 tablet (20 mg total) by mouth daily.   [DISCONTINUED] albuterol  (PROVENTIL  HFA;VENTOLIN  HFA) 108 (90 Base) MCG/ACT inhaler Inhale 2 puffs into the lungs every 6 (six) hours as needed for wheezing or shortness of breath. (Patient not taking: Reported on 01/20/2024)   [DISCONTINUED] diphenhydrAMINE (BENADRYL) 25 MG tablet Take 50 mg by mouth at bedtime as needed for sleep. (Patient not taking: Reported on 01/20/2024)   No facility-administered encounter medications on file as of 01/20/2024.    Past Medical History:  Diagnosis Date   Acute CVA (cerebrovascular accident) (HCC) 11/07/2023   Anxiety 03/05/2016   Arthritis    rheumatoid   Asthma    Body aches 03/05/2016   Depression    Emphysema of lung (HCC)    Fibromyalgia    GERD (  gastroesophageal reflux disease)    Hyperlipidemia    Hypertension    Obesity    Recent onset of diabetes mellitus (HCC) 03/10/2016   Referred to Dr Monte Antonio and rx metformin  500 mg bid and zocor  20 mg 1 daily   Varicose vein of leg 03/05/2016   Vitiligo 03/05/2016    Past Surgical History:  Procedure Laterality Date   CAROTID ANGIOGRAPHY N/A 11/10/2023   Procedure: CAROTID ANGIOGRAPHY;  Surgeon: Kayla Part, MD;  Location: [facility] INVASIVE CV  LAB;  Service: Cardiovascular;  Laterality: N/A;   CHOLECYSTECTOMY     COLONOSCOPY N/A 08/20/2016   Procedure: COLONOSCOPY;  Surgeon: Ruby Corporal, MD;  Location: [facility] ENDO SUITE;  Service: Endoscopy;  Laterality: N/A;  830    DILATION AND CURETTAGE OF UTERUS     ENDARTERECTOMY Left 11/15/2023   Procedure: ENDARTERECTOMY, CAROTID;  Surgeon: Young Hensen, MD;  Location: [facility] OR;  Service: Vascular;  Laterality: Left;   GALLBLADDER SURGERY     LEG SURGERY Left    broke leg   PATCH ANGIOPLASTY  11/15/2023   Procedure: ANGIOPLASTY, USING PATCH GRAFT;  Surgeon: Young Hensen, MD;  Location: [facility] OR;  Service: Vascular;;   POLYPECTOMY  08/20/2016   Procedure: POLYPECTOMY;  Surgeon: Ruby Corporal, MD;  Location: [facility] ENDO SUITE;  Service: Endoscopy;;  colon   TUBAL LIGATION      Family History  Problem Relation Age of Onset   Cancer Paternal Grandfather        prostate   Diabetes Paternal Grandmother    Emphysema Maternal Grandmother    Cancer Maternal Grandmother        lung   Alcohol  abuse Maternal Grandfather    Diabetes Father    Hypertension Mother    Diabetes Mother    Crohn's disease Sister     Social History   Socioeconomic History   Marital status: Divorced    Spouse name: Not on file   Number of children: 2   Years of education: 13   Highest education level: Not on file  Occupational History   Occupation: hairdresser    Comment: self  Tobacco Use   Smoking status: Former    Current packs/day: 0.00    Average packs/day: 1 pack/day for 20.0 years (20.0 ttl pk-yrs)    Types: Cigarettes    Quit date: 11/06/2023    Years since quitting: 0.2   Smokeless tobacco: Never  Vaping Use   Vaping status: Never Used  Substance and Sexual Activity   Alcohol  use: Yes    Comment: occ 1 monthly    Drug use: No   Sexual activity: Not Currently    Birth control/protection: Surgical    Comment: tubal  Other Topics Concern   Not on file  Social History Narrative    Lives at home with two children, son is 75, daughter is 15   Social Drivers of Corporate investment banker Strain: Not on file  Food Insecurity: No Food Insecurity (11/08/2023)   Hunger Vital Sign    Worried About Running Out of Food in the Last Year: Never true    Ran Out of Food in the Last Year: Never true  Transportation Needs: No Transportation Needs (11/08/2023)   PRAPARE - Administrator, Civil Service (Medical): No    Lack of Transportation (Non-Medical): No  Physical Activity: Not on file  Stress: Not on file  Social Connections: Not on file  Intimate Partner Violence: Not At Risk (  11/08/2023)   Humiliation, Afraid, Rape, and Kick questionnaire    Fear of Current or Ex-Partner: No    Emotionally Abused: No    Physically Abused: No    Sexually Abused: No    ROS      Objective    BP (!) 159/92   Pulse 82   Ht 5' 6 (1.676 m)   Wt 238 lb 0.6 oz (108 kg)   SpO2 97%   BMI 38.42 kg/m   Physical Exam Vitals and nursing note reviewed.  Constitutional:      Appearance: She is obese.   Eyes:     Extraocular Movements: Extraocular movements intact.     Pupils: Pupils are equal, round, and reactive to light.    Cardiovascular:     Rate and Rhythm: Normal rate and regular rhythm.  Pulmonary:     Effort: Pulmonary effort is normal.     Breath sounds: Normal breath sounds.   Musculoskeletal:     Cervical back: Normal range of motion and neck supple.     Right lower leg: No edema.     Left lower leg: No edema.   Neurological:     Mental Status: She is alert and oriented to person, place, and time.   Psychiatric:        Mood and Affect: Mood normal.        Thought Content: Thought content normal.         Assessment & Plan:   Problem List Items Addressed This Visit       Cardiovascular and Mediastinum   Essential hypertension, benign   She brought in home blood pressure readings which are consistently staying above 140-150 for SBP. Will  increase amlodipine  to 10 mg today. Continue to work on healthy low-sodium diet and weight reduction.  Also recommend smoking cessation.      Relevant Medications   amLODipine  (NORVASC ) 10 MG tablet   rosuvastatin  (CRESTOR ) 20 MG tablet   Acute CVA (cerebrovascular accident) (HCC)   She is doing well at this time.  Continue with BP meds rosuvastatin  and low-dose aspirin .  Recommend follow-up in 3 months. Recommend smoking cessation. Smoking cessation instruction/counseling given:  counseled patient on the dangers of tobacco use, advised patient to stop smoking, and reviewed strategies to maximize success       Relevant Medications   amLODipine  (NORVASC ) 10 MG tablet   rosuvastatin  (CRESTOR ) 20 MG tablet   Carotid stenosis   She underwent CEA on 4 7.  She continues to take statin and low-dose aspirin .      Relevant Medications   amLODipine  (NORVASC ) 10 MG tablet   rosuvastatin  (CRESTOR ) 20 MG tablet     Endocrine   Type 2 diabetes, HbA1c goal < 7% (HCC) - Primary   She is currently only on Jardiance .  Home blood sugars are remaining above 150 for AM FBS.  She declines addition of of Mounjaro or another oral medication at this time. Therefore I placed a referral to dietitian to review dietary changes needed for better blood sugar control.  Will recheck labs today.  Follow-up in 3 months or sooner if needed.      Relevant Medications   empagliflozin  (JARDIANCE ) 10 MG TABS tablet   rosuvastatin  (CRESTOR ) 20 MG tablet   Other Relevant Orders   CMP14+EGFR   HgB A1c   Amb ref to Medical Nutrition Therapy-MNT    No follow-ups on file.   Alison Irvine, FNP

## 2024-01-20 NOTE — Assessment & Plan Note (Signed)
 She is doing well at this time.  Continue with BP meds rosuvastatin  and low-dose aspirin .  Recommend follow-up in 3 months. Recommend smoking cessation. Smoking cessation instruction/counseling given:  counseled patient on the dangers of tobacco use, advised patient to stop smoking, and reviewed strategies to maximize success

## 2024-01-20 NOTE — Telephone Encounter (Signed)
 Copied from CRM [id number]. Topic: Clinical - Prescription Issue >> Jan 20, 2024 11:39 AM Juluis Ok wrote: Reason for CRM: Erica Graham with [employer] states that patient received amlodipine  5mg  from provider, [place] on 06/05. She is needing to verify if patient is to received the amlodipine  10mg  as well. Request a callback today at [phone]. States she will not release amlodipine  10mg  until she heard from provider/nurse.

## 2024-01-20 NOTE — Assessment & Plan Note (Signed)
 She is currently only on Jardiance .  Home blood sugars are remaining above 150 for AM FBS.  She declines addition of of Mounjaro or another oral medication at this time. Therefore I placed a referral to dietitian to review dietary changes needed for better blood sugar control.  Will recheck labs today.  Follow-up in 3 months or sooner if needed.

## 2024-01-21 LAB — CMP14+EGFR
ALT: 34 IU/L — ABNORMAL HIGH (ref 0–32)
AST: 34 IU/L (ref 0–40)
Albumin: 4.1 g/dL (ref 3.8–4.9)
Alkaline Phosphatase: 113 IU/L (ref 44–121)
BUN/Creatinine Ratio: 20 (ref 9–23)
BUN: 39 mg/dL — ABNORMAL HIGH (ref 6–24)
Bilirubin Total: 0.5 mg/dL (ref 0.0–1.2)
CO2: 16 mmol/L — ABNORMAL LOW (ref 20–29)
Calcium: 9.5 mg/dL (ref 8.7–10.2)
Chloride: 103 mmol/L (ref 96–106)
Creatinine, Ser: 1.94 mg/dL — ABNORMAL HIGH (ref 0.57–1.00)
Globulin, Total: 2.9 g/dL (ref 1.5–4.5)
Glucose: 143 mg/dL — ABNORMAL HIGH (ref 70–99)
Potassium: 5 mmol/L (ref 3.5–5.2)
Sodium: 137 mmol/L (ref 134–144)
Total Protein: 7 g/dL (ref 6.0–8.5)
eGFR: 29 mL/min/{1.73_m2} — ABNORMAL LOW (ref >59)

## 2024-01-21 LAB — HEMOGLOBIN A1C
Est. average glucose Bld gHb Est-mCnc: 163 mg/dL
Hgb A1c MFr Bld: 7.3 % — ABNORMAL HIGH (ref 4.8–5.6)

## 2024-01-24 ENCOUNTER — Telehealth: Payer: Self-pay

## 2024-01-24 NOTE — Telephone Encounter (Signed)
 Called and confirmed it is supposed to be 10mg  instead of 5mg 

## 2024-01-24 NOTE — Telephone Encounter (Signed)
 Copied from CRM [id number]. Topic: Clinical - Medication Question >> Jan 24, 2024 11:02 AM Donee H wrote: Reason for CRM: Heidi Llamas with [employer] states that patient received amlodipine  5mg  from provider, [place] on 06/05. She is needing to verify if patient is to received the amlodipine  10mg  as well. Requesting callback today at [phone]. She stated she has already left messages but never received a callback and she will not release amlodipine  10mg  until she heard from provider/nurse.

## 2024-02-10 ENCOUNTER — Ambulatory Visit: Payer: Self-pay | Admitting: Family Medicine

## 2024-03-16 ENCOUNTER — Telehealth: Payer: Self-pay

## 2024-03-16 NOTE — Telephone Encounter (Signed)
 [place] from [facility] called asking if patient could have her teeth cleaned since she had a stroke in March, 2025.  [place], [place]; patient is clear to have her teeth cleaned.

## 2024-04-16 ENCOUNTER — Other Ambulatory Visit: Payer: Self-pay

## 2024-04-16 ENCOUNTER — Observation Stay (HOSPITAL_COMMUNITY)
Admission: EM | Admit: 2024-04-16 | Discharge: 2024-04-18 | Disposition: A | Discharge status: Home / Self Care | Attending: Family Medicine | Admitting: Family Medicine

## 2024-04-16 ENCOUNTER — Emergency Department (HOSPITAL_COMMUNITY)

## 2024-04-16 DIAGNOSIS — E782 Mixed hyperlipidemia: Secondary | ICD-10-CM | POA: Insufficient documentation

## 2024-04-16 DIAGNOSIS — Z6838 Body mass index (BMI) 38.0-38.9, adult: Secondary | ICD-10-CM | POA: Diagnosis not present

## 2024-04-16 DIAGNOSIS — M797 Fibromyalgia: Secondary | ICD-10-CM | POA: Insufficient documentation

## 2024-04-16 DIAGNOSIS — N179 Acute kidney failure, unspecified: Secondary | ICD-10-CM | POA: Diagnosis not present

## 2024-04-16 DIAGNOSIS — Z87891 Personal history of nicotine dependence: Secondary | ICD-10-CM | POA: Diagnosis not present

## 2024-04-16 DIAGNOSIS — F419 Anxiety disorder, unspecified: Secondary | ICD-10-CM | POA: Insufficient documentation

## 2024-04-16 DIAGNOSIS — I6529 Occlusion and stenosis of unspecified carotid artery: Secondary | ICD-10-CM | POA: Insufficient documentation

## 2024-04-16 DIAGNOSIS — I6389 Other cerebral infarction: Secondary | ICD-10-CM | POA: Diagnosis not present

## 2024-04-16 DIAGNOSIS — R9431 Abnormal electrocardiogram [ECG] [EKG]: Secondary | ICD-10-CM | POA: Diagnosis not present

## 2024-04-16 DIAGNOSIS — F17200 Nicotine dependence, unspecified, uncomplicated: Secondary | ICD-10-CM | POA: Diagnosis present

## 2024-04-16 DIAGNOSIS — Z7982 Long term (current) use of aspirin: Secondary | ICD-10-CM | POA: Diagnosis not present

## 2024-04-16 DIAGNOSIS — I639 Cerebral infarction, unspecified: Secondary | ICD-10-CM | POA: Diagnosis not present

## 2024-04-16 DIAGNOSIS — I129 Hypertensive chronic kidney disease with stage 1 through stage 4 chronic kidney disease, or unspecified chronic kidney disease: Secondary | ICD-10-CM | POA: Insufficient documentation

## 2024-04-16 DIAGNOSIS — Z79899 Other long term (current) drug therapy: Secondary | ICD-10-CM | POA: Insufficient documentation

## 2024-04-16 DIAGNOSIS — Z7902 Long term (current) use of antithrombotics/antiplatelets: Secondary | ICD-10-CM | POA: Diagnosis not present

## 2024-04-16 DIAGNOSIS — E119 Type 2 diabetes mellitus without complications: Secondary | ICD-10-CM

## 2024-04-16 DIAGNOSIS — M069 Rheumatoid arthritis, unspecified: Secondary | ICD-10-CM | POA: Insufficient documentation

## 2024-04-16 DIAGNOSIS — E1122 Type 2 diabetes mellitus with diabetic chronic kidney disease: Secondary | ICD-10-CM | POA: Diagnosis not present

## 2024-04-16 DIAGNOSIS — R202 Paresthesia of skin: Secondary | ICD-10-CM | POA: Diagnosis present

## 2024-04-16 DIAGNOSIS — I10 Essential (primary) hypertension: Secondary | ICD-10-CM | POA: Diagnosis present

## 2024-04-16 DIAGNOSIS — N189 Chronic kidney disease, unspecified: Secondary | ICD-10-CM | POA: Diagnosis not present

## 2024-04-16 LAB — CBC WITH DIFFERENTIAL/PLATELET
Abs Immature Granulocytes: 0.04 K/uL (ref 0.00–0.07)
Basophils Absolute: 0 K/uL (ref 0.0–0.1)
Basophils Relative: 0 %
Eosinophils Absolute: 0 K/uL (ref 0.0–0.5)
Eosinophils Relative: 0 %
HCT: 50.4 % — ABNORMAL HIGH (ref 36.0–46.0)
Hemoglobin: 16.9 g/dL — ABNORMAL HIGH (ref 12.0–15.0)
Immature Granulocytes: 1 %
Lymphocytes Relative: 10 %
Lymphs Abs: 0.7 K/uL (ref 0.7–4.0)
MCH: 29.9 pg (ref 26.0–34.0)
MCHC: 33.5 g/dL (ref 30.0–36.0)
MCV: 89 fL (ref 80.0–100.0)
Monocytes Absolute: 0.4 K/uL (ref 0.1–1.0)
Monocytes Relative: 5 %
Neutro Abs: 5.8 K/uL (ref 1.7–7.7)
Neutrophils Relative %: 84 %
Platelets: 167 K/uL (ref 150–400)
RBC: 5.66 MIL/uL — ABNORMAL HIGH (ref 3.87–5.11)
RDW: 14.1 % (ref 11.5–15.5)
WBC: 6.8 K/uL (ref 4.0–10.5)
nRBC: 0 % (ref 0.0–0.2)

## 2024-04-16 LAB — URINALYSIS, ROUTINE W REFLEX MICROSCOPIC
Bilirubin Urine: NEGATIVE
Glucose, UA: >=500 mg/dL — AB
Ketones, ur: NEGATIVE mg/dL
Leukocytes,Ua: NEGATIVE
Nitrite: NEGATIVE
Protein, ur: 30 mg/dL — AB
Specific Gravity, Urine: 1.002 — ABNORMAL LOW (ref 1.005–1.030)
pH: 6 (ref 5.0–8.0)

## 2024-04-16 LAB — COMPREHENSIVE METABOLIC PANEL WITH GFR
ALT: 43 U/L (ref 0–44)
AST: 29 U/L (ref 15–41)
Albumin: 4 g/dL (ref 3.5–5.0)
Alkaline Phosphatase: 106 U/L (ref 38–126)
Anion gap: 10 (ref 5–15)
BUN: 49 mg/dL — ABNORMAL HIGH (ref 6–20)
CO2: 17 mmol/L — ABNORMAL LOW (ref 22–32)
Calcium: 9.1 mg/dL (ref 8.9–10.3)
Chloride: 107 mmol/L (ref 98–111)
Creatinine, Ser: 1.94 mg/dL — ABNORMAL HIGH (ref 0.44–1.00)
GFR, Estimated: 29 mL/min — ABNORMAL LOW (ref >60)
Glucose, Bld: 201 mg/dL — ABNORMAL HIGH (ref 70–99)
Potassium: 4.4 mmol/L (ref 3.5–5.1)
Sodium: 134 mmol/L — ABNORMAL LOW (ref 135–145)
Total Bilirubin: 0.9 mg/dL (ref 0.0–1.2)
Total Protein: 8 g/dL (ref 6.5–8.1)

## 2024-04-16 LAB — HEMOGLOBIN A1C
Hgb A1c MFr Bld: 7.1 % — ABNORMAL HIGH (ref 4.8–5.6)
Mean Plasma Glucose: 157.07 mg/dL

## 2024-04-16 LAB — PHOSPHORUS: Phosphorus: 3.9 mg/dL (ref 2.5–4.6)

## 2024-04-16 LAB — GLUCOSE, CAPILLARY
Glucose-Capillary: 129 mg/dL — ABNORMAL HIGH (ref 70–99)
Glucose-Capillary: 120 mg/dL — ABNORMAL HIGH (ref 70–99)

## 2024-04-16 LAB — MAGNESIUM: Magnesium: 2.3 mg/dL (ref 1.7–2.4)

## 2024-04-16 LAB — LIPID PANEL
Cholesterol: 224 mg/dL — ABNORMAL HIGH (ref 0–200)
HDL: 43 mg/dL (ref >40)
LDL Cholesterol: 153 mg/dL — ABNORMAL HIGH (ref 0–99)
Total CHOL/HDL Ratio: 5.2 ratio
Triglycerides: 140 mg/dL (ref <150)
VLDL: 28 mg/dL (ref 0–40)

## 2024-04-16 LAB — PROTIME-INR
INR: 1 (ref 0.8–1.2)
Prothrombin Time: 13.5 s (ref 11.4–15.2)

## 2024-04-16 LAB — TSH: TSH: 3.173 u[IU]/mL (ref 0.350–4.500)

## 2024-04-16 LAB — APTT: aPTT: 43 s — ABNORMAL HIGH (ref 24–36)

## 2024-04-16 LAB — BRAIN NATRIURETIC PEPTIDE: B Natriuretic Peptide: 106 pg/mL — ABNORMAL HIGH (ref 0.0–100.0)

## 2024-04-16 LAB — CBG MONITORING, ED: Glucose-Capillary: 194 mg/dL — ABNORMAL HIGH (ref 70–99)

## 2024-04-16 MED ORDER — HYDROMORPHONE HCL 1 MG/ML IJ SOLN: 0.5000 mg | INTRAMUSCULAR | Status: DC | PRN

## 2024-04-16 MED ORDER — HEPARIN SODIUM (PORCINE) 5000 UNIT/ML IJ SOLN
5000.0000 [IU] | Freq: Three times a day (TID) | INTRAMUSCULAR | Status: DC
  Administered 2024-04-16 – 2024-04-18 (×5): 5000 [IU] via SUBCUTANEOUS
  Filled 2024-04-16 (×5): qty 1

## 2024-04-16 MED ORDER — ACETAMINOPHEN 650 MG RE SUPP: 650.0000 mg | Freq: Four times a day (QID) | RECTAL | Status: DC | PRN

## 2024-04-16 MED ORDER — CLOPIDOGREL BISULFATE 75 MG PO TABS
75.0000 mg | ORAL_TABLET | Freq: Every day | ORAL | Status: DC
  Administered 2024-04-17 – 2024-04-18 (×2): 75 mg via ORAL
  Filled 2024-04-16 (×2): qty 1

## 2024-04-16 MED ORDER — EMPAGLIFLOZIN 10 MG PO TABS
10.0000 mg | ORAL_TABLET | Freq: Every day | ORAL | Status: DC
  Administered 2024-04-17 – 2024-04-18 (×2): 10 mg via ORAL
  Filled 2024-04-16 (×3): qty 1

## 2024-04-16 MED ORDER — CLOPIDOGREL BISULFATE 75 MG PO TABS: 150.0000 mg | ORAL_TABLET | Freq: Every day | ORAL | Status: DC

## 2024-04-16 MED ORDER — SODIUM CHLORIDE 0.9 % IV SOLN: INTRAVENOUS | Status: DC

## 2024-04-16 MED ORDER — IPRATROPIUM BROMIDE 0.02 % IN SOLN: 0.5000 mg | Freq: Four times a day (QID) | RESPIRATORY_TRACT | Status: DC | PRN

## 2024-04-16 MED ORDER — TRAZODONE HCL 50 MG PO TABS: 25.0000 mg | ORAL_TABLET | Freq: Every evening | ORAL | Status: DC | PRN

## 2024-04-16 MED ORDER — ONDANSETRON HCL 4 MG PO TABS: 4.0000 mg | ORAL_TABLET | Freq: Four times a day (QID) | ORAL | Status: DC | PRN

## 2024-04-16 MED ORDER — CLOPIDOGREL BISULFATE 75 MG PO TABS
300.0000 mg | ORAL_TABLET | Freq: Once | ORAL | Status: AC
  Administered 2024-04-16: 300 mg via ORAL
  Filled 2024-04-16: qty 4

## 2024-04-16 MED ORDER — OXYCODONE HCL 5 MG PO TABS: 5.0000 mg | ORAL_TABLET | ORAL | Status: DC | PRN

## 2024-04-16 MED ORDER — ROSUVASTATIN CALCIUM 20 MG PO TABS
40.0000 mg | ORAL_TABLET | Freq: Every day | ORAL | Status: DC
  Administered 2024-04-17 – 2024-04-18 (×2): 40 mg via ORAL
  Filled 2024-04-16 (×2): qty 2

## 2024-04-16 MED ORDER — HYDRALAZINE HCL 20 MG/ML IJ SOLN: 10.0000 mg | INTRAMUSCULAR | Status: DC | PRN

## 2024-04-16 MED ORDER — STROKE: EARLY STAGES OF RECOVERY BOOK
Freq: Once | Status: AC
  Filled 2024-04-16: qty 1

## 2024-04-16 MED ORDER — SENNOSIDES-DOCUSATE SODIUM 8.6-50 MG PO TABS
1.0000 | ORAL_TABLET | Freq: Every evening | ORAL | Status: DC | PRN
Start: 2024-04-16 — End: 2024-04-18

## 2024-04-16 MED ORDER — ASPIRIN 81 MG PO TBEC
81.0000 mg | DELAYED_RELEASE_TABLET | Freq: Every day | ORAL | Status: DC
Start: 2024-04-16 — End: 2024-04-18
  Administered 2024-04-16 – 2024-04-18 (×3): 81 mg via ORAL
  Filled 2024-04-16 (×3): qty 1

## 2024-04-16 MED ORDER — FLEET ENEMA RE ENEM: 1.0000 | ENEMA | Freq: Once | RECTAL | Status: DC | PRN

## 2024-04-16 MED ORDER — LEVALBUTEROL HCL 0.63 MG/3ML IN NEBU: 0.6300 mg | INHALATION_SOLUTION | Freq: Four times a day (QID) | RESPIRATORY_TRACT | Status: DC | PRN

## 2024-04-16 MED ORDER — INSULIN ASPART 100 UNIT/ML IJ SOLN
0.0000 [IU] | Freq: Three times a day (TID) | INTRAMUSCULAR | Status: DC
  Administered 2024-04-16 – 2024-04-17 (×3): 2 [IU] via SUBCUTANEOUS
  Administered 2024-04-17: 3 [IU] via SUBCUTANEOUS
  Administered 2024-04-18: 2 [IU] via SUBCUTANEOUS

## 2024-04-16 MED ORDER — SODIUM CHLORIDE 0.9 % IV BOLUS
1000.0000 mL | Freq: Once | INTRAVENOUS | Status: AC
  Administered 2024-04-16: 1000 mL via INTRAVENOUS

## 2024-04-16 MED ORDER — ACETAMINOPHEN 325 MG PO TABS: 650.0000 mg | ORAL_TABLET | Freq: Four times a day (QID) | ORAL | Status: DC | PRN

## 2024-04-16 MED ORDER — ONDANSETRON HCL 4 MG/2ML IJ SOLN: 4.0000 mg | Freq: Four times a day (QID) | INTRAMUSCULAR | Status: DC | PRN

## 2024-04-16 MED ORDER — SODIUM CHLORIDE 0.9% FLUSH
3.0000 mL | Freq: Two times a day (BID) | INTRAVENOUS | Status: DC
  Administered 2024-04-17: 3 mL via INTRAVENOUS

## 2024-04-16 MED ORDER — PANTOPRAZOLE SODIUM 40 MG PO TBEC
40.0000 mg | DELAYED_RELEASE_TABLET | Freq: Every day | ORAL | Status: DC
  Administered 2024-04-17 – 2024-04-18 (×2): 40 mg via ORAL
  Filled 2024-04-16 (×2): qty 1

## 2024-04-16 MED ORDER — SODIUM CHLORIDE 0.9% FLUSH
3.0000 mL | Freq: Two times a day (BID) | INTRAVENOUS | Status: DC
  Administered 2024-04-16: 3 mL via INTRAVENOUS

## 2024-04-16 MED ORDER — BISACODYL 5 MG PO TBEC: 5.0000 mg | DELAYED_RELEASE_TABLET | Freq: Every day | ORAL | Status: DC | PRN

## 2024-04-16 NOTE — Assessment & Plan Note (Addendum)
-   Checking hemoglobin A1c -Will monitor CBG q. ACHS, SSI coverage - Reviewing home regimen-on Jardiance  10 mg daily

## 2024-04-16 NOTE — ED Notes (Signed)
 Pt has still not returned to the room.

## 2024-04-16 NOTE — Assessment & Plan Note (Signed)
-   Upper extremity weakness, numbness  MRI/ MRA Brain:  Multiple foci of restricted diffusion within the corpus callosum, primarily involving the body on the right and the splenium on the left, compatible with acute non-hemorrhagic infarcts.  MRA: No significant intracranial vascular stenosis Diminutive A1 segment of the left anterior cerebral artery.  - Admitted for stroke workup -Consulting neurology -2D echocardiogram: - Consulting PT OT/speech for evaluation recommendations - Obtaining labs, A1c, lipid panel - Neurochecks, -Continue with aspirin , Plavix , high-dose statins

## 2024-04-16 NOTE — Assessment & Plan Note (Addendum)
 Previous workup:  Carotid ultrasound concerning for left ICA occlusion with left ACA retrograde flow,  -Carotid angiography done - 50% stenosis at the ostia of the CCA. retrograde filling of the ECA, with antegrade filling of the ICA which is greater than 99% stenosis.  - CEA performed on 4/7. - On statins (Crestor  20 mg) and low-dose aspirin 

## 2024-04-16 NOTE — Assessment & Plan Note (Signed)
-   Monitor BUN/creatinine closely - Avoid nephrotoxin, hypotension,  Lab Results  Component Value Date   CREATININE 1.94 (H) 04/16/2024   CREATININE 1.94 (H) 01/20/2024   CREATININE 2.38 (H) 11/16/2023

## 2024-04-16 NOTE — Assessment & Plan Note (Signed)
 Body mass index is 38.58 kg/m. Discussed with patient regarding benefit of weight loss Patient is to follow-up with the PCP regarding weight loss programs and medications

## 2024-04-16 NOTE — ED Notes (Signed)
 Patient transported to CT

## 2024-04-16 NOTE — Assessment & Plan Note (Signed)
 Monitoring not on any home meds

## 2024-04-16 NOTE — Assessment & Plan Note (Signed)
-   Currently stable, monitoring, continue as needed Xanax

## 2024-04-16 NOTE — H&P (Addendum)
 History and Physical   Patient: Erica Graham                            PCP: Bevely Doffing, FNP                    DOB: May 16, 1966            DOA: 04/16/2024 FMW:[id number]             DOS: 04/16/2024, 3:00 PM  Bevely Doffing, FNP  Patient coming from:   HOME  I have personally reviewed patient's medical records, in electronic medical records, including:  [facility], and care everywhere.    Chief Complaint:   Bilateral lower and upper extremity numbness, with lower extremity weakness   History of present illness:    Erica Graham is a 58 year old female with past medical history of CVA, HTN, HLD, DM2, GERD, presenting with chief complaint of bilateral upper extremity numbness and weakness.  Symptoms started approximately 7 PM last night. Also vague lower extremity weakness.   Patient has a history of previous CVAs in March, with left ICA workup in April 2025.  Patient reports of no, double vision, headaches or dizziness.  Denies any neck pain back pain, shortness of breath or chest pain.    ED Evaluation; Blood pressure (!) 155/80, pulse 78, temperature 98.3 F RR 16, height 5' 6 (1.676 m), weight 108.4 kg, SpO2 100%.  LABs; within normal limits with exception of sodium 134, CO2 17 BUN 49 creatinine 1.94,  UA: Clear,  negative for ketones, leukocytes, rare bacteria WBC 0-5  MRI/ MRA Brain:  Multiple foci of restricted diffusion within the corpus callosum, primarily involving the body on the right and the splenium on the left, compatible with acute non-hemorrhagic infarcts.  MRA:  1. No significant stenosis of the intracranial vasculature. 2. Diminutive A1 segment of the left anterior cerebral artery.    Requested patient to be admitted for acute stroke   Patient Denies having: Fever, Chills, Cough, SOB, Chest Pain, Abd pain, N/V/D, headache, dizziness, lightheadedness,  Dysuria, Joint pain, rash, open wounds  Review of Systems: As per HPI, otherwise 10  point review of systems were negative.   ----------------------------------------------------------------------------------------------------------------------  Allergies  Allergen Reactions   Lipitor [Atorvastatin] Other (See Comments)    Myalgia and joint pain    Home MEDs:  Prior to Admission medications   Medication Sig Start Date End Date Taking? Authorizing Provider  aspirin  EC 81 MG tablet Take 1 tablet (81 mg total) by mouth daily. Swallow whole. 11/11/23   Elgergawy, Brayton RAMAN, MD  empagliflozin  (JARDIANCE ) 10 MG TABS tablet Take 1 tablet (10 mg total) by mouth daily before breakfast. 01/20/24   Bevely Doffing, FNP  pantoprazole  (PROTONIX ) 40 MG tablet Take 1 tablet (40 mg total) by mouth daily. 12/07/23   Rhyne, Samantha J, PA-C  Polyethyl Glycol-Propyl Glycol (SYSTANE OP) Place 1 drop into both eyes as needed (pain).    [provider]  rosuvastatin  (CRESTOR ) 20 MG tablet Take 1 tablet (20 mg total) by mouth daily. 01/20/24   Bevely Doffing, FNP    PRN MEDs: acetaminophen  **OR** acetaminophen , hydrALAZINE , HYDROmorphone  (DILAUDID ) injection, ipratropium, levalbuterol , ondansetron  **OR** ondansetron  (ZOFRAN ) IV, oxyCODONE , senna-docusate, sodium phosphate , traZODone   Past Medical History:  Diagnosis Date   Acute CVA (cerebrovascular accident) (HCC) 11/07/2023   Anxiety 03/05/2016   Arthritis    rheumatoid   Asthma    Body  aches 03/05/2016   Depression    Emphysema of lung (HCC)    Fibromyalgia    GERD (gastroesophageal reflux disease)    Hyperlipidemia    Hypertension    Obesity    Recent onset of diabetes mellitus (HCC) 03/10/2016   Referred to Dr Lenis and rx metformin  500 mg bid and zocor  20 mg 1 daily   Varicose vein of leg 03/05/2016   Vitiligo 03/05/2016    Past Surgical History:  Procedure Laterality Date   CAROTID ANGIOGRAPHY N/A 11/10/2023   Procedure: CAROTID ANGIOGRAPHY;  Surgeon: Lanis Fonda BRAVO, MD;  Location: [facility] INVASIVE CV LAB;  Service:  Cardiovascular;  Laterality: N/A;   CHOLECYSTECTOMY     COLONOSCOPY N/A 08/20/2016   Procedure: COLONOSCOPY;  Surgeon: Claudis RAYMOND Rivet, MD;  Location: [facility] ENDO SUITE;  Service: Endoscopy;  Laterality: N/A;  830    DILATION AND CURETTAGE OF UTERUS     ENDARTERECTOMY Left 11/15/2023   Procedure: ENDARTERECTOMY, CAROTID;  Surgeon: Gretta Lonni PARAS, MD;  Location: [facility] OR;  Service: Vascular;  Laterality: Left;   GALLBLADDER SURGERY     LEG SURGERY Left    broke leg   PATCH ANGIOPLASTY  11/15/2023   Procedure: ANGIOPLASTY, USING PATCH GRAFT;  Surgeon: Gretta Lonni PARAS, MD;  Location: [facility] OR;  Service: Vascular;;   POLYPECTOMY  08/20/2016   Procedure: POLYPECTOMY;  Surgeon: Claudis RAYMOND Rivet, MD;  Location: [facility] END[facility] SUITE;  Service: Endoscopy;;  colon   TUBAL LIGATION       reports that she quit smoking about 5 months ago. Her smoking use included cigarettes. She has a 20 pack-year smoking history. She has never used smokeless tobacco. She reports current alcohol  use. She reports that she does not use drugs.   Family History  Problem Relation Age of Onset   Cancer Paternal Grandfather        prostate   Diabetes Paternal Grandmother    Emphysema Maternal Grandmother    Cancer Maternal Grandmother        lung   Alcohol  abuse Maternal Grandfather    Diabetes Father    Hypertension Mother    Diabetes Mother    Crohn's disease Sister     Physical Exam:   Vitals:   04/16/24 1130 04/16/24 1230 04/16/24 1419 04/16/24 1425  BP: (!) 151/85 (!) 155/80 (!) 153/76   Pulse: 77 78 77 78  Resp: 19 16 12 13   Temp:    98 F (36.7 C)  TempSrc:    Oral  SpO2: 98% 100% 98% 100%  Weight:      Height:       Constitutional: NAD, calm, comfortable Psychiatric: Normal judgment and insight. Alert and oriented x 3. Normal mood.  Eyes: PERRL, lids and conjunctivae normal ENMT: Mucous membranes are moist. Posterior pharynx clear of any exudate or lesions.Normal dentition.  Neck: normal, supple, no  masses, no thyromegaly Respiratory: clear to auscultation bilaterally, no wheezing, no crackles. Normal respiratory effort. No accessory muscle use.  Cardiovascular: Regular rate and rhythm, no murmurs / rubs / gallops. No extremity edema. 2+ pedal pulses. No carotid bruits.  Abdomen: no tenderness, no masses palpated. No hepatosplenomegaly. Bowel sounds positive.  Musculoskeletal: no clubbing / cyanosis. No joint deformity upper and lower extremities. Good ROM, no contractures. Normal muscle tone.  Neurologic: Subjective bilateral upper extremity numbness, symmetric weakness bilateral 4 out of 5 CN II-XII grossly intact. Sensation intact, DTR normal. Strength 5/5 in all 4.  Skin: no rashes, lesions, ulcers. No induration  Labs on admission:    I have personally reviewed following labs and imaging studies  CBC: Recent Labs  Lab 04/16/24 1000  WBC 6.8  NEUTROABS 5.8  HGB 16.9*  HCT 50.4*  MCV 89.0  PLT 167   Basic Metabolic Panel: Recent Labs  Lab 04/16/24 1000  NA 134*  K 4.4  CL 107  CO2 17*  GLUCOSE 201*  BUN 49*  CREATININE 1.94*  CALCIUM  9.1  MG 2.3  PHOS 3.9   GFR: Estimated Creatinine Clearance: 39.4 mL/min (A) (by C-G formula based on SCr of 1.94 mg/dL (H)). Liver Function Tests: Recent Labs  Lab 04/16/24 1000  AST 29  ALT 43  ALKPHOS 106  BILITOT 0.9  PROT 8.0  ALBUMIN  4.0    Coagulation Profile: Recent Labs  Lab 04/16/24 1000  INR 1.0     CBG: Recent Labs  Lab 04/16/24 1009  GLUCAP 194*   Lipid Profile: Recent Labs    04/16/24 1000  CHOL 224*  HDL 43  LDLCALC 153*  TRIG 140  CHOLHDL 5.2   Thyroid  Function Tests: Recent Labs    04/16/24 1000  TSH 3.173   Anemia Panel: No results for input(s): VITAMINB12, FOLATE, FERRITIN, TIBC, IRON, RETICCTPCT in the last 72 hours. Urine analysis:    Component Value Date/Time   COLORURINE COLORLESS (A) 04/16/2024 0956   APPEARANCEUR CLEAR 04/16/2024 0956    LABSPEC 1.002 (L) 04/16/2024 0956   PHURINE 6.0 04/16/2024 0956   GLUCOSEU >=500 (A) 04/16/2024 0956   HGBUR SMALL (A) 04/16/2024 0956   BILIRUBINUR NEGATIVE 04/16/2024 0956   KETONESUR NEGATIVE 04/16/2024 0956   PROTEINUR 30 (A) 04/16/2024 0956   NITRITE NEGATIVE 04/16/2024 0956   LEUKOCYTESUR NEGATIVE 04/16/2024 0956    Last A1C:  Lab Results  Component Value Date   HGBA1C 7.3 (H) 01/20/2024     Radiologic Exams on Admission:   MR Angio Head WO CONTRAST Result Date: 04/16/2024 EXAM: MR Angiography Head without intravenous Contrast. 04/16/2024 11:04:10 AM TECHNIQUE: Magnetic resonance angiography images of the head without intravenous contrast. Multiplanar 2D and 3D reformatted images are provided for review. COMPARISON: None provided. CLINICAL HISTORY: Neuro deficit, acute, stroke suspected. FINDINGS: ANTERIOR CIRCULATION: No significant stenosis of the internal carotid arteries. The A1 segment of the left anterior cerebral artery is diminutive. No significant stenosis of the middle cerebral arteries. No aneurysm. POSTERIOR CIRCULATION: No significant stenosis of the posterior cerebral arteries. No significant stenosis of the basilar artery. No significant stenosis of the vertebral arteries. No aneurysm. IMPRESSION: 1. No significant stenosis of the intracranial vasculature. 2. Diminutive A1 segment of the left anterior cerebral artery. Electronically signed by: Evalene Coho MD 04/16/2024 11:43 AM EDT RP Workstation: [id number]   MR Brain Wo Contrast (neuro protocol) Result Date: 04/16/2024 EXAM: MRI BRAIN WITHOUT CONTRAST 04/16/2024 11:04:10 AM TECHNIQUE: Multiplanar multisequence MRI of the head/brain was performed without the administration of intravenous contrast. COMPARISON: MRI of the head dated 11/08/2010. CLINICAL HISTORY: Neuro deficit, acute, stroke suspected. FINDINGS: BRAIN AND VENTRICLES: Multiple foci of restricted diffusion are present within the corpus callosum, primarily  involving the body of the corpus callosum on the right and the splenium on the left. Chronic encephalomalacia changes are present medially within the left frontal lobe with blooming artifact in the left caudate nucleus, presumably from hemosiderin staining. Several foci of increased T2 signal are also present in the subcortical white matter bilaterally. Moderate cerebellar volume loss is present. No mass. No midline shift. No hydrocephalus. The sella is unremarkable. Normal  flow voids. ORBITS: No acute abnormality. SINUSES AND MASTOIDS: Effusions are present within the mastoid air cells bilaterally. No acute abnormality. BONES AND SOFT TISSUES: Normal marrow signal. No acute soft tissue abnormality. IMPRESSION: 1. Multiple foci of restricted diffusion within the corpus callosum, primarily involving the body on the right and the splenium on the left, compatible with acute non-hemorrhagic infarcts. 2. Chronic encephalomalacia changes medially within the left frontal lobe with blooming artifact in the left caudate nucleus, presumably from hemosiderin staining. 3. Moderate cerebellar volume loss. Electronically signed by: Evalene Coho MD 04/16/2024 11:40 AM EDT RP Workstation: [id number]    EKG:   Independently reviewed.  Orders placed or performed during the hospital encounter of 04/16/24   ED EKG   ED EKG   EKG 12-Lead   EKG 12-Lead   EKG 12-Lead   ---------------------------------------------------------------------------------------------------------------------------------------    Assessment / Plan:   Principal Problem:   Acute CVA (cerebrovascular accident) [facility]) Active Problems:   Fibromyalgia   Essential hypertension   Acute kidney injury superimposed on chronic kidney disease (HCC)   Anxiety   Mixed hyperlipidemia   Morbid obesity due to excess calories (HCC)   Rheumatoid arthritis (HCC)   Smoker   Controlled type 2 diabetes mellitus without complication, without long-term  current use of insulin  (HCC)   Carotid stenosis   Stroke (HCC)   Assessment and Plan: * Acute CVA (cerebrovascular accident) (HCC) - Upper extremity weakness, numbness  MRI/ MRA Brain:  Multiple foci of restricted diffusion within the corpus callosum, primarily involving the body on the right and the splenium on the left, compatible with acute non-hemorrhagic infarcts.  MRA: No significant intracranial vascular stenosis Diminutive A1 segment of the left anterior cerebral artery.  - Admitted for stroke workup -Consulting neurology -2D echocardiogram: - Consulting PT OT/speech for evaluation recommendations - Obtaining labs, A1c, lipid panel - Neurochecks, -Continue with aspirin , Plavix , high-dose statins   Acute kidney injury superimposed on chronic kidney disease (HCC) - Monitor BUN/creatinine closely - Avoid nephrotoxin, hypotension,  Lab Results  Component Value Date   CREATININE 1.94 (H) 04/16/2024   CREATININE 1.94 (H) 01/20/2024   CREATININE 2.38 (H) 11/16/2023      Essential hypertension - Withholding BP meds, allowing for permissive hypertension -   Fibromyalgia Monitoring not on any home meds  Anxiety - Currently stable, monitoring, continue as needed Xanax  Stroke [facility]) - Previous episodes of stroke earlier this year March and April -Patient has been compliant with aspirin  and statins Further workup for recurrent stroke findings as above  Carotid stenosis Previous workup:  Carotid ultrasound concerning for left ICA occlusion with left ACA retrograde flow,  -Carotid angiography done - 50% stenosis at the ostia of the CCA. retrograde filling of the ECA, with antegrade filling of the ICA which is greater than 99% stenosis.  - CEA performed on 4/7. - On statins (Crestor  20 mg) and low-dose aspirin    Controlled type 2 diabetes mellitus without complication, without long-term current use of insulin  (HCC) - Checking hemoglobin A1c -Will monitor CBG q.  ACHS, SSI coverage - Reviewing home regimen-on Jardiance  10 mg daily  Smoker - Discussed with patient regarding smoking cessation NicoDerm patch offered  Rheumatoid arthritis (HCC) - Reviewing home medication Continue NSAIDs as needed Not on any steroids or immunosuppressants  Morbid obesity due to excess calories (HCC) Body mass index is 38.58 kg/m. Discussed with patient regarding benefit of weight loss Patient is to follow-up with the PCP regarding weight loss programs and medications  Mixed  hyperlipidemia - Continue with high-dose statins - Patient reports that she stopped taking her Crestor  as she noted that her cholesterol level was within normal limits on her last visit with her doctors Lipid Panel     Component Value Date/Time   CHOL 224 (H) 04/16/2024 1000   CHOL 188 03/05/2016 1127   TRIG 140 04/16/2024 1000   HDL 43 04/16/2024 1000   HDL 35 (L) 03/05/2016 1127   CHOLHDL 5.2 04/16/2024 1000   VLDL 28 04/16/2024 1000   LDLCALC 153 (H) 04/16/2024 1000   LDLCALC 116 (H) 05/27/2017 0831   LABVLDL 27 03/05/2016 1127       Consults called: Neurologist -------------------------------------------------------------------------------------------------------------------------------------------- DVT prophylaxis:  heparin  injection 5,000 Units Start: 04/16/24 2200 SCDs Start: 04/16/24 1400   Code Status:   Code Status: Full Code   Admission status: Patient will be admitted as Observation, with a greater than 2 midnight length of stay. Level of care: Telemetry   Family Communication:  none at bedside  (The above findings and plan of care has been discussed with patient in detail, the patient expressed understanding and agreement of above plan)  --------------------------------------------------------------------------------------------------------------------------------------------------  Disposition Plan:  Anticipated 1-2 days Status is: Observation The  patient remains OBS appropriate and will d/c before 2 midnights.  -------------------------------------------------------------------------------------------------------------------------  Time spent:  25  Min.  Was spent seeing and evaluating the patient, reviewing all medical records, drawn plan of care.  SIGNED: Adriana DELENA Grams, MD, FHM. FAAFP. [facility], Pager  (Please use [email or web address] to page/ or secure chat through epic) If 7PM-7AM, please contact night-coverage [email or web address],  04/16/2024, 3:00 PM

## 2024-04-16 NOTE — Assessment & Plan Note (Signed)
-   Discussed with patient regarding smoking cessation NicoDerm patch offered

## 2024-04-16 NOTE — Assessment & Plan Note (Signed)
-   Withholding BP meds, allowing for permissive hypertension -

## 2024-04-16 NOTE — Hospital Course (Addendum)
 Erica Graham is a 58 year old female with past medical history of CVA, HTN, HLD, DM2, GERD, presenting with chief complaint of bilateral upper extremity numbness and weakness.  Symptoms started approximately 7 PM last night. Also vague lower extremity weakness.   Patient has a history of previous CVAs in March, with left ICA workup in April 2025.  Patient reports of no, double vision, headaches or dizziness.  Denies any neck pain back pain, shortness of breath or chest pain.    ED Evaluation; Blood pressure (!) 155/80, pulse 78, temperature 98.3 F RR 16, height 5' 6 (1.676 m), weight 108.4 kg, SpO2 100%.  LABs; within normal limits with exception of sodium 134, CO2 17 BUN 49 creatinine 1.94,  UA: Clear,  negative for ketones, leukocytes, rare bacteria WBC 0-5  MRI/ MRA Brain:  Multiple foci of restricted diffusion within the corpus callosum, primarily involving the body on the right and the splenium on the left, compatible with acute non-hemorrhagic infarcts.  MRA:  1. No significant stenosis of the intracranial vasculature. 2. Diminutive A1 segment of the left anterior cerebral artery.    Requested patient to be admitted for acute stroke

## 2024-04-16 NOTE — ED Provider Notes (Signed)
 [place] EMERGENCY DEPARTMENT AT [facility] Provider Note   CSN: [id number] Arrival date & time: 04/16/24  9057     Patient presents with: No chief complaint on file.   Erica Graham is a 58 y.o. female.   Patient is a 57 year old female who presents emergency department with a chief complaint of numbness, paresthesias and weakness.  Patient does have a history of CVA and is concerned that she may be having another stroke.  Patient notes that symptoms began at 7 PM last night.  She notes that it is difficult to localize her numbness and paresthesias stating it feels as though it is both sides.  She notes that she did have some weakness in her left leg last night and felt as though it was giving away.  Patient denies any recent falls or blunt head trauma.  She denies any abnormal headaches.  She denies pain to her neck or back.  She denies any associated chest pain, shortness of breath, palpitations.  She has had no abdominal pain, nausea, vomiting, diarrhea.        Prior to Admission medications   Medication Sig Start Date End Date Taking? Authorizing Provider  amLODipine  (NORVASC ) 10 MG tablet Take 1 tablet (10 mg total) by mouth daily. 01/20/24 01/19/25  Bevely Doffing, FNP  aspirin  EC 81 MG tablet Take 1 tablet (81 mg total) by mouth daily. Swallow whole. 11/11/23   Elgergawy, Brayton RAMAN, MD  empagliflozin  (JARDIANCE ) 10 MG TABS tablet Take 1 tablet (10 mg total) by mouth daily before breakfast. 01/20/24   Bevely Doffing, FNP  pantoprazole  (PROTONIX ) 40 MG tablet Take 1 tablet (40 mg total) by mouth daily. 12/07/23   Rhyne, Samantha J, PA-C  Polyethyl Glycol-Propyl Glycol (SYSTANE OP) Apply 1 drop to eye as needed.    [provider]  rosuvastatin  (CRESTOR ) 20 MG tablet Take 1 tablet (20 mg total) by mouth daily. 01/20/24   Bevely Doffing, FNP    Allergies: Lipitor [atorvastatin]    Review of Systems  Neurological:  Positive for weakness and numbness.  All other  systems reviewed and are negative.   Updated Vital Signs BP (!) 163/78   Pulse 81   Temp 98.3 F (36.8 C) (Oral)   Resp 19   Ht 5' 6 (1.676 m)   Wt 108.4 kg   SpO2 96%   BMI 38.58 kg/m   Physical Exam Vitals and nursing note reviewed.  Constitutional:      General: She is not in acute distress.    Appearance: Normal appearance. She is not ill-appearing.  HENT:     Head: Normocephalic and atraumatic.     Nose: Nose normal.     Mouth/Throat:     Mouth: Mucous membranes are moist.  Eyes:     Extraocular Movements: Extraocular movements intact.     Conjunctiva/sclera: Conjunctivae normal.     Pupils: Pupils are equal, round, and reactive to light.  Cardiovascular:     Rate and Rhythm: Normal rate and regular rhythm.     Pulses: Normal pulses.     Heart sounds: Normal heart sounds. No murmur heard.    No gallop.  Pulmonary:     Effort: Pulmonary effort is normal. No respiratory distress.     Breath sounds: Normal breath sounds. No stridor. No wheezing, rhonchi or rales.  Abdominal:     General: Abdomen is flat. Bowel sounds are normal. There is no distension.     Palpations: Abdomen is soft.  Tenderness: There is no abdominal tenderness. There is no guarding.  Musculoskeletal:        General: Normal range of motion.     Cervical back: Normal range of motion and neck supple. No rigidity or tenderness.     Right lower leg: No edema.     Left lower leg: No edema.  Skin:    General: Skin is warm and dry.     Findings: No bruising or rash.  Neurological:     General: No focal deficit present.     Mental Status: She is alert and oriented to person, place, and time. Mental status is at baseline.     Cranial Nerves: No cranial nerve deficit.     Sensory: No sensory deficit.     Motor: No weakness.     Coordination: Coordination normal.     Gait: Gait normal.     Comments: Normal finger-nose, normal heel-to-shin, normal rapid alternating movements, sensation intact  bilaterally, no nystagmus  Psychiatric:        Mood and Affect: Mood normal.        Behavior: Behavior normal.        Thought Content: Thought content normal.        Judgment: Judgment normal.     (all labs ordered are listed, but only abnormal results are displayed) Labs Reviewed  COMPREHENSIVE METABOLIC PANEL WITH GFR - Abnormal; Notable for the following components:      Result Value   Sodium 134 (*)    CO2 17 (*)    Glucose, Bld 201 (*)    BUN 49 (*)    Creatinine, Ser 1.94 (*)    GFR, Estimated 29 (*)    All other components within normal limits  CBC WITH DIFFERENTIAL/PLATELET - Abnormal; Notable for the following components:   RBC 5.66 (*)    Hemoglobin 16.9 (*)    HCT 50.4 (*)    All other components within normal limits  APTT - Abnormal; Notable for the following components:   aPTT 43 (*)    All other components within normal limits  CBG MONITORING, ED - Abnormal; Notable for the following components:   Glucose-Capillary 194 (*)    All other components within normal limits  PROTIME-INR  MAGNESIUM   URINALYSIS, ROUTINE W REFLEX MICROSCOPIC  TSH    EKG: None  Radiology: No results found.   Procedures   Medications Ordered in the ED  sodium chloride  0.9 % bolus 1,000 mL (has no administration in time range)                                    Medical Decision Making Amount and/or Complexity of Data Reviewed Labs: ordered. Radiology: ordered.  Risk Prescription drug management. Decision regarding hospitalization.   This patient presents to the ED for concern of numbness, paresthesias, left leg weakness, this involves an extensive number of treatment options, and is a complaint that carries with it a high risk of complications and morbidity.  The differential diagnosis includes CVA, TIA, space-occupying lesion, meningitis, encephalitis, electrolyte derangement, acute kidney injury   Co morbidities that complicate the patient evaluation  Previous  CVA   Additional history obtained:  Additional history obtained from medical records External records from outside source obtained and reviewed including medical records   Lab Tests:  I Ordered, and personally interpreted labs.  The pertinent results include: No leukocytosis, no anemia, mild hyponatremia, elevated  creatinine but at baseline, normal liver function, unremarkable urinalysis, normal magnesium , normal TSH   Imaging Studies ordered:  I ordered imaging studies including MRI brain, MRA I independently visualized and interpreted imaging which showed ischemic changes in bilateral corpus callosum, unremarkable MRA I agree with the radiologist interpretation   Cardiac Monitoring: / EKG:  The patient was maintained on a cardiac monitor.  I personally viewed and interpreted the cardiac monitored which showed an underlying rhythm of: Normal sinus rhythm, no ST/T wave changes, no ischemic changes, no STEMI   Consultations Obtained:  I requested consultation with the neurology, Dr. Michaela,  and discussed lab and imaging findings as well as pertinent plan - they recommend: Admission, further stroke workup   Problem List / ED Course / Critical interventions / Medication management  Patient is doing well at this time and does remain stable.  Patient has no focal weakness on exam at this point but does continue to have some bilateral numbness and paresthesias.  MRI is concerning for new ischemic changes.  Did discuss this with the neurologist on-call who did recommend admission and further stroke workup.  He did recommend giving the patient a dose of Plavix  as well which has been ordered.  Patient continues to have stable vital signs and will allow for permissive hypertension.  Blood work is otherwise been unremarkable at this point.  Kidney function is at baseline.  Have discussed patient case with Dr. Willette with the hospital service who has excepted for admission. I ordered  medication including IV fluids, Plavix  for hyponatremia, CVA Reevaluation of the patient after these medicines showed that the patient improved I have reviewed the patients home medicines and have made adjustments as needed   Social Determinants of Health:  None   Test / Admission - Considered:  Admission     Final diagnoses:  None    ED Discharge Orders     None          Daralene Lonni BIRCH, PA-C 04/16/24 1457    Suzette Pac, MD 04/20/24 1038

## 2024-04-16 NOTE — Assessment & Plan Note (Addendum)
-   Continue with high-dose statins - Patient reports that she stopped taking her Crestor  as she noted that her cholesterol level was within normal limits on her last visit with her doctors Lipid Panel     Component Value Date/Time   CHOL 224 (H) 04/16/2024 1000   CHOL 188 03/05/2016 1127   TRIG 140 04/16/2024 1000   HDL 43 04/16/2024 1000   HDL 35 (L) 03/05/2016 1127   CHOLHDL 5.2 04/16/2024 1000   VLDL 28 04/16/2024 1000   LDLCALC 153 (H) 04/16/2024 1000   LDLCALC 116 (H) 05/27/2017 0831   LABVLDL 27 03/05/2016 1127

## 2024-04-16 NOTE — Assessment & Plan Note (Signed)
-   Previous episodes of stroke earlier this year March and April -Patient has been compliant with aspirin  and statins Further workup for recurrent stroke findings as above

## 2024-04-16 NOTE — ED Notes (Signed)
 ED TO INPATIENT HANDOFF REPORT  ED Nurse Name and Phone #:  Celina [phone]  S Name/Age/Gender Erica Graham 58 y.o. female Room/Bed: APA01/APA01  Code Status   Code Status: Full Code  Home/SNF/Other Home Patient oriented to: self, place, time, and situation Is this baseline? Yes   Triage Complete: Triage complete  Chief Complaint Stroke [facility]) [I63.9]  Triage Note Pt to the ED with last know well as 1900 yesterday.  Pt states both hands are numb and tingling.  Pt states she had a fall last night due to her left leg giving out  Pt had a previous stroke in March 2025 and states she feels like she is having a stroke again.  Chris PA at the bedside as soon as the patient was roomed.    Allergies Allergies  Allergen Reactions   Lipitor [Atorvastatin] Other (See Comments)    Myalgia and joint pain    Level of Care/Admitting Diagnosis ED Disposition     ED Disposition  Admit   Condition  --   Comment  Hospital Area: [facility] [[id number]]  Level of Care: Telemetry [5]  Covid Evaluation: Recent COVID positive no isolation required infection day 21-90  Diagnosis: Stroke [facility]) [[id number]]  Admitting Physician: WILLETTE ADRIANA DELENA ALPHA  Attending Physician: WILLETTE ADRIANA DELENA ALPHA  For patients discharging to extended facilities (i.e. SNF, AL, group homes or LTAC) initiate:: Discharge to SNF/Facility Placement COVID-19 Lab Testing Protocol          B Medical/Surgery History Past Medical History:  Diagnosis Date   Acute CVA (cerebrovascular accident) (HCC) 11/07/2023   Anxiety 03/05/2016   Arthritis    rheumatoid   Asthma    Body aches 03/05/2016   Depression    Emphysema of lung (HCC)    Fibromyalgia    GERD (gastroesophageal reflux disease)    Hyperlipidemia    Hypertension    Obesity    Recent onset of diabetes mellitus (HCC) 03/10/2016   Referred to Dr Lenis and rx metformin  500 mg bid and zocor  20 mg 1 daily   Varicose vein  of leg 03/05/2016   Vitiligo 03/05/2016   Past Surgical History:  Procedure Laterality Date   CAROTID ANGIOGRAPHY N/A 11/10/2023   Procedure: CAROTID ANGIOGRAPHY;  Surgeon: Lanis Fonda BRAVO, MD;  Location[facility] INVASIVE CV LAB;  Service: Cardiovascular;  Laterality: N/A;   CHOLECYSTECTOMY     COLONOSCOPY N/A 08/20/2016   Procedure: COLONOSCOPY;  Surgeon: Claudis RAYMOND Rivet, MD;  Location[facility];  Service: Endoscopy;  Laterality: N/A;  830    DILATION AND CURETTAGE OF UTERUS     ENDARTERECTOMY Left 11/15/2023   Procedure: ENDARTERECTOMY, CAROTID;  Surgeon: Gretta Lonni PARAS, MD;  Location: [facility] OR;  Service: Vascular;  Laterality: Left;   GALLBLADDER SURGERY     LEG SURGERY Left    broke leg   PATCH ANGIOPLASTY  11/15/2023   Procedure: ANGIOPLASTY, USING PATCH GRAFT;  Surgeon: Gretta Lonni PARAS, MD;  Location: [facility] OR;  Service: Vascular;;   POLYPECTOMY  08/20/2016   Procedure: POLYPECTOMY;  Surgeon: Claudis RAYMOND Rivet, MD;  Location: [facility];  Service: Endoscopy;;  colon   TUBAL LIGATION       A IV Location/Drains/Wounds Patient Lines/Drains/Airways Status     Active Line/Drains/Airways     Name Placement date Placement time Site Days   Peripheral IV 04/16/24 20 G Posterior;Right Forearm 04/16/24  1000  Forearm  less than 1  Intake/Output Last 24 hours  Intake/Output Summary (Last 24 hours) at 04/16/2024 1422 Last data filed at 04/16/2024 1204 Gross per 24 hour  Intake 999 ml  Output --  Net 999 ml    Labs/Imaging Results for orders placed or performed during the hospital encounter of 04/16/24 (from the past 48 hours)  Urinalysis, Routine w reflex microscopic -Urine, Clean Catch     Status: Abnormal   Collection Time: 04/16/24  9:56 AM  Result Value Ref Range   Color, Urine COLORLESS (A) YELLOW   APPearance CLEAR CLEAR   Specific Gravity, Urine 1.002 (L) 1.005 - 1.030   pH 6.0 5.0 - 8.0   Glucose, UA >=500 (A) NEGATIVE mg/dL   Hgb urine dipstick SMALL  (A) NEGATIVE   Bilirubin Urine NEGATIVE NEGATIVE   Ketones, ur NEGATIVE NEGATIVE mg/dL   Protein, ur 30 (A) NEGATIVE mg/dL   Nitrite NEGATIVE NEGATIVE   Leukocytes,Ua NEGATIVE NEGATIVE   RBC / HPF 0-5 0 - 5 RBC/hpf   WBC, UA 0-5 0 - 5 WBC/hpf   Bacteria, UA RARE (A) NONE SEEN   Squamous Epithelial / HPF 0-5 0 - 5 /HPF    Comment: Performed at [facility], [address], [place], [place] [address]  Comprehensive metabolic panel     Status: Abnormal   Collection Time: 04/16/24 10:00 AM  Result Value Ref Range   Sodium 134 (L) 135 - 145 mmol/L   Potassium 4.4 3.5 - 5.1 mmol/L   Chloride 107 98 - 111 mmol/L   CO2 17 (L) 22 - 32 mmol/L   Glucose, Bld 201 (H) 70 - 99 mg/dL    Comment: Glucose reference range applies only to samples taken after fasting for at least 8 hours.   BUN 49 (H) 6 - 20 mg/dL   Creatinine, Ser 8.05 (H) 0.44 - 1.00 mg/dL   Calcium  9.1 8.9 - 10.3 mg/dL   Total Protein 8.0 6.5 - 8.1 g/dL   Albumin  4.0 3.5 - 5.0 g/dL   AST 29 15 - 41 U/L   ALT 43 0 - 44 U/L   Alkaline Phosphatase 106 38 - 126 U/L   Total Bilirubin 0.9 0.0 - 1.2 mg/dL   GFR, Estimated 29 (L) >60 mL/min    Comment: (NOTE) Calculated using the CKD-EPI Creatinine Equation (2021)    Anion gap 10 5 - 15    Comment: Performed at [facility], [address], [place], [place] [address]  CBC with Differential     Status: Abnormal   Collection Time: 04/16/24 10:00 AM  Result Value Ref Range   WBC 6.8 4.0 - 10.5 K/uL   RBC 5.66 (H) 3.87 - 5.11 MIL/uL   Hemoglobin 16.9 (H) 12.0 - 15.0 g/dL   HCT 49.5 (H) 63.9 - 53.9 %   MCV 89.0 80.0 - 100.0 fL   MCH 29.9 26.0 - 34.0 pg   MCHC 33.5 30.0 - 36.0 g/dL   RDW 85.8 88.4 - 84.4 %   Platelets 167 150 - 400 K/uL   nRBC 0.0 0.0 - 0.2 %   Neutrophils Relative % 84 %   Neutro Abs 5.8 1.7 - 7.7 K/uL   Lymphocytes Relative 10 %   Lymphs Abs 0.7 0.7 - 4.0 K/uL   Monocytes Relative 5 %   Monocytes Absolute 0.4 0.1 - 1.0 K/uL   Eosinophils Relative 0 %    Eosinophils Absolute 0.0 0.0 - 0.5 K/uL   Basophils Relative 0 %   Basophils Absolute 0.0 0.0 -  0.1 K/uL   Immature Granulocytes 1 %   Abs Immature Granulocytes 0.04 0.00 - 0.07 K/uL    Comment: Performed at [facility], [address], [place], [place] [address]  Protime-INR     Status: None   Collection Time: 04/16/24 10:00 AM  Result Value Ref Range   Prothrombin Time 13.5 11.4 - 15.2 seconds   INR 1.0 0.8 - 1.2    Comment: (NOTE) INR goal varies based on device and disease states. Performed at [facility], [address] [place], [place] [address]   APTT     Status: Abnormal   Collection Time: 04/16/24 10:00 AM  Result Value Ref Range   aPTT 43 (H) 24 - 36 seconds    Comment:        IF BASELINE aPTT IS ELEVATED, SUGGEST PATIENT RISK ASSESSMENT BE USED TO DETERMINE APPROPRIATE ANTICOAGULANT THERAPY. Performed at [facility], [address], [place], [place] [address]   Magnesium      Status: None   Collection Time: 04/16/24 10:00 AM  Result Value Ref Range   Magnesium  2.3 1.7 - 2.4 mg/dL    Comment: Performed at [facility], [address] [place], [place] [address]  TSH     Status: None   Collection Time: 04/16/24 10:00 AM  Result Value Ref Range   TSH 3.173 0.350 - 4.500 uIU/mL    Comment: Performed by a 3rd Generation assay with a functional sensitivity of <=0.01 uIU/mL. Performed at [facility], [address], [place], [place] [address]   CBG monitoring, ED     Status: Abnormal   Collection Time: 04/16/24 10:09 AM  Result Value Ref Range   Glucose-Capillary 194 (H) 70 - 99 mg/dL    Comment: Glucose reference range applies only to samples taken after fasting for at least 8 hours.   MR Angio Head WO CONTRAST Result Date: 04/16/2024 EXAM: MR Angiography Head without intravenous Contrast. 04/16/2024 11:04:10 AM TECHNIQUE: Magnetic resonance angiography images of the head without intravenous contrast. Multiplanar 2D and 3D reformatted images are provided for review.  COMPARISON: None provided. CLINICAL HISTORY: Neuro deficit, acute, stroke suspected. FINDINGS: ANTERIOR CIRCULATION: No significant stenosis of the internal carotid arteries. The A1 segment of the left anterior cerebral artery is diminutive. No significant stenosis of the middle cerebral arteries. No aneurysm. POSTERIOR CIRCULATION: No significant stenosis of the posterior cerebral arteries. No significant stenosis of the basilar artery. No significant stenosis of the vertebral arteries. No aneurysm. IMPRESSION: 1. No significant stenosis of the intracranial vasculature. 2. Diminutive A1 segment of the left anterior cerebral artery. Electronically signed by: Evalene Coho MD 04/16/2024 11:43 AM EDT RP Workstation: [id number]   MR Brain Wo Contrast (neuro protocol) Result Date: 04/16/2024 EXAM: MRI BRAIN WITHOUT CONTRAST 04/16/2024 11:04:10 AM TECHNIQUE: Multiplanar multisequence MRI of the head/brain was performed without the administration of intravenous contrast. COMPARISON: MRI of the head dated 11/08/2010. CLINICAL HISTORY: Neuro deficit, acute, stroke suspected. FINDINGS: BRAIN AND VENTRICLES: Multiple foci of restricted diffusion are present within the corpus callosum, primarily involving the body of the corpus callosum on the right and the splenium on the left. Chronic encephalomalacia changes are present medially within the left frontal lobe with blooming artifact in the left caudate nucleus, presumably from hemosiderin staining. Several foci of increased T2 signal are also present in the subcortical white matter bilaterally. Moderate cerebellar volume loss is present. No mass. No midline shift. No hydrocephalus. The sella is unremarkable. Normal flow voids. ORBITS: No acute abnormality. SINUSES AND MASTOIDS:  Effusions are present within the mastoid air cells bilaterally. No acute abnormality. BONES AND SOFT TISSUES: Normal marrow signal. No acute soft tissue abnormality. IMPRESSION: 1. Multiple foci of  restricted diffusion within the corpus callosum, primarily involving the body on the right and the splenium on the left, compatible with acute non-hemorrhagic infarcts. 2. Chronic encephalomalacia changes medially within the left frontal lobe with blooming artifact in the left caudate nucleus, presumably from hemosiderin staining. 3. Moderate cerebellar volume loss. Electronically signed by: Evalene Coho MD 04/16/2024 11:40 AM EDT RP Workstation: [id number]    Pending Labs Unresulted Labs (From admission, onward)     Start     Ordered   04/17/24 0500  Basic metabolic panel  Daily,   R      04/16/24 1400   04/17/24 0500  CBC  Daily,   R      04/16/24 1400   04/16/24 1402  Lipid panel  (Labs)  Add-on,   AD       Comments: Fasting    04/16/24 1403   04/16/24 1402  Hemoglobin A1c  (Labs)  Add-on,   AD       Comments: To assess prior glycemic control    04/16/24 1403   04/16/24 1401  Brain natriuretic peptide  Add-on,   AD        04/16/24 1400   04/16/24 1400  Phosphorus  Add-on,   AD        04/16/24 1400   04/16/24 1400  Expectorated Sputum Assessment w Gram Stain, Rflx to Resp Cult  Once,   R       Comments: If productive cough    04/16/24 1400            Vitals/Pain Today's Vitals   04/16/24 1130 04/16/24 1230 04/16/24 1306 04/16/24 1419  BP: (!) 151/85 (!) 155/80  (!) 153/76  Pulse: 77 78  77  Resp: 19 16  12   Temp:      TempSrc:      SpO2: 98% 100%  98%  Weight:      Height:      PainSc:   0-No pain     Isolation Precautions No active isolations  Medications Medications  heparin  injection 5,000 Units (has no administration in time range)  sodium chloride  flush (NS) 0.9 % injection 3 mL (has no administration in time range)  0.9 %  sodium chloride  infusion (has no administration in time range)  sodium chloride  flush (NS) 0.9 % injection 3 mL (has no administration in time range)  acetaminophen  (TYLENOL ) tablet 650 mg (has no administration in time range)     Or  acetaminophen  (TYLENOL ) suppository 650 mg (has no administration in time range)  oxyCODONE  (Oxy IR/ROXICODONE ) immediate release tablet 5 mg (has no administration in time range)  HYDROmorphone  (DILAUDID ) injection 0.5-1 mg (has no administration in time range)  traZODone  (DESYREL ) tablet 25 mg (has no administration in time range)  senna-docusate (Senokot-S) tablet 1 tablet (has no administration in time range)  bisacodyl  (DULCOLAX) EC tablet 5 mg (has no administration in time range)  sodium phosphate  (FLEET) enema 1 enema (has no administration in time range)  ondansetron  (ZOFRAN ) tablet 4 mg (has no administration in time range)    Or  ondansetron  (ZOFRAN ) injection 4 mg (has no administration in time range)  ipratropium (ATROVENT ) nebulizer solution 0.5 mg (has no administration in time range)  levalbuterol  (XOPENEX ) nebulizer solution 0.63 mg (has no administration in time range)  hydrALAZINE  (APRESOLINE ) injection  10 mg (has no administration in time range)   stroke: early stages of recovery book (has no administration in time range)  insulin  aspart (novoLOG ) injection 0-15 Units (has no administration in time range)  aspirin  EC tablet 81 mg (has no administration in time range)  empagliflozin  (JARDIANCE ) tablet 10 mg (has no administration in time range)  pantoprazole  (PROTONIX ) EC tablet 40 mg (has no administration in time range)  rosuvastatin  (CRESTOR ) tablet 40 mg (has no administration in time range)  sodium chloride  0.9 % bolus 1,000 mL (0 mLs Intravenous Stopped 04/16/24 1204)  clopidogrel  (PLAVIX ) tablet 300 mg (300 mg Oral Given 04/16/24 1303)    Mobility walks     Focused Assessments Cardia- NSR Cardiac Assessment Handoff:  Cardiac Rhythm: Normal sinus rhythm Lab Results  Component Value Date   CKTOTAL 99 03/04/2017   No results found for: DDIMER Does the Patient currently have chest pain? No    R Recommendations: See Admitting Provider Note  Report  given to:   Additional Notes: none

## 2024-04-16 NOTE — ED Triage Notes (Signed)
 Pt to the ED with last know well as 1900 yesterday.  Pt states both hands are numb and tingling.  Pt states she had a fall last night due to her left leg giving out  Pt had a previous stroke in March 2025 and states she feels like she is having a stroke again.  Chris PA at the bedside as soon as the patient was roomed.

## 2024-04-16 NOTE — Assessment & Plan Note (Signed)
-   Reviewing home medication Continue NSAIDs as needed Not on any steroids or immunosuppressants

## 2024-04-16 NOTE — ED Notes (Signed)
 Transport aware to take pt to 300.

## 2024-04-17 ENCOUNTER — Other Ambulatory Visit (HOSPITAL_COMMUNITY): Payer: Self-pay | Admitting: *Deleted

## 2024-04-17 ENCOUNTER — Observation Stay (HOSPITAL_COMMUNITY)

## 2024-04-17 ENCOUNTER — Telehealth: Payer: Self-pay | Admitting: *Deleted

## 2024-04-17 DIAGNOSIS — I639 Cerebral infarction, unspecified: Secondary | ICD-10-CM | POA: Diagnosis not present

## 2024-04-17 DIAGNOSIS — R29701 NIHSS score 1: Secondary | ICD-10-CM | POA: Diagnosis not present

## 2024-04-17 DIAGNOSIS — E119 Type 2 diabetes mellitus without complications: Secondary | ICD-10-CM | POA: Diagnosis not present

## 2024-04-17 DIAGNOSIS — I10 Essential (primary) hypertension: Secondary | ICD-10-CM | POA: Diagnosis not present

## 2024-04-17 DIAGNOSIS — I6340 Cerebral infarction due to embolism of unspecified cerebral artery: Secondary | ICD-10-CM | POA: Diagnosis not present

## 2024-04-17 DIAGNOSIS — N200 Calculus of kidney: Secondary | ICD-10-CM | POA: Diagnosis not present

## 2024-04-17 DIAGNOSIS — I6389 Other cerebral infarction: Secondary | ICD-10-CM | POA: Diagnosis not present

## 2024-04-17 DIAGNOSIS — Z87891 Personal history of nicotine dependence: Secondary | ICD-10-CM | POA: Diagnosis not present

## 2024-04-17 DIAGNOSIS — E785 Hyperlipidemia, unspecified: Secondary | ICD-10-CM

## 2024-04-17 DIAGNOSIS — K7460 Unspecified cirrhosis of liver: Secondary | ICD-10-CM | POA: Diagnosis not present

## 2024-04-17 DIAGNOSIS — R160 Hepatomegaly, not elsewhere classified: Secondary | ICD-10-CM | POA: Diagnosis not present

## 2024-04-17 DIAGNOSIS — I6523 Occlusion and stenosis of bilateral carotid arteries: Secondary | ICD-10-CM | POA: Diagnosis not present

## 2024-04-17 DIAGNOSIS — I700 Atherosclerosis of aorta: Secondary | ICD-10-CM | POA: Diagnosis not present

## 2024-04-17 LAB — GLUCOSE, CAPILLARY
Glucose-Capillary: 181 mg/dL — ABNORMAL HIGH (ref 70–99)
Glucose-Capillary: 139 mg/dL — ABNORMAL HIGH (ref 70–99)
Glucose-Capillary: 127 mg/dL — ABNORMAL HIGH (ref 70–99)
Glucose-Capillary: 166 mg/dL — ABNORMAL HIGH (ref 70–99)

## 2024-04-17 LAB — CBC
HCT: 46.3 % — ABNORMAL HIGH (ref 36.0–46.0)
Hemoglobin: 15.3 g/dL — ABNORMAL HIGH (ref 12.0–15.0)
MCH: 29.7 pg (ref 26.0–34.0)
MCHC: 33 g/dL (ref 30.0–36.0)
MCV: 89.9 fL (ref 80.0–100.0)
Platelets: 156 K/uL (ref 150–400)
RBC: 5.15 MIL/uL — ABNORMAL HIGH (ref 3.87–5.11)
RDW: 14.2 % (ref 11.5–15.5)
WBC: 5.5 K/uL (ref 4.0–10.5)
nRBC: 0 % (ref 0.0–0.2)

## 2024-04-17 LAB — BASIC METABOLIC PANEL WITH GFR
Anion gap: 6 (ref 5–15)
BUN: 37 mg/dL — ABNORMAL HIGH (ref 6–20)
CO2: 19 mmol/L — ABNORMAL LOW (ref 22–32)
Calcium: 8.6 mg/dL — ABNORMAL LOW (ref 8.9–10.3)
Chloride: 112 mmol/L — ABNORMAL HIGH (ref 98–111)
Creatinine, Ser: 1.76 mg/dL — ABNORMAL HIGH (ref 0.44–1.00)
GFR, Estimated: 33 mL/min — ABNORMAL LOW (ref >60)
Glucose, Bld: 145 mg/dL — ABNORMAL HIGH (ref 70–99)
Potassium: 4.3 mmol/L (ref 3.5–5.1)
Sodium: 137 mmol/L (ref 135–145)

## 2024-04-17 LAB — ECHOCARDIOGRAM COMPLETE
Area-P 1/2: 1.7 cm2
Height: 66 in
S' Lateral: 3.2 cm
Weight: 3809.6 [oz_av]

## 2024-04-17 MED ORDER — IOHEXOL 9 MG/ML PO SOLN
ORAL | Status: AC
  Filled 2024-04-17: qty 1000

## 2024-04-17 MED ORDER — SODIUM CHLORIDE 0.9 % IV SOLN: INTRAVENOUS | Status: AC

## 2024-04-17 MED ORDER — IOHEXOL 300 MG/ML  SOLN
75.0000 mL | Freq: Once | INTRAMUSCULAR | Status: AC | PRN
  Administered 2024-04-17: 75 mL via INTRAVENOUS

## 2024-04-17 NOTE — Progress Notes (Signed)
*  PRELIMINARY RESULTS* Echocardiogram 2D Echocardiogram has been performed.  Erica Graham 04/17/2024, 4:22 PM

## 2024-04-17 NOTE — Plan of Care (Signed)
  Problem: Coping: Goal: Ability to adjust to condition or change in health will improve Outcome: Progressing   Problem: Fluid Volume: Goal: Ability to maintain a balanced intake and output will improve Outcome: Progressing   Problem: Health Behavior/Discharge Planning: Goal: Ability to identify and utilize available resources and services will improve Outcome: Progressing Goal: Ability to manage health-related needs will improve Outcome: Progressing   Problem: Metabolic: Goal: Ability to maintain appropriate glucose levels will improve Outcome: Progressing   Problem: Nutritional: Goal: Maintenance of adequate nutrition will improve Outcome: Progressing Goal: Progress toward achieving an optimal weight will improve Outcome: Progressing   Problem: Education: Goal: Knowledge of disease or condition will improve Outcome: Progressing   Problem: Ischemic Stroke/TIA Tissue Perfusion: Goal: Complications of ischemic stroke/TIA will be minimized Outcome: Progressing   Problem: Coping: Goal: Will verbalize positive feelings about self Outcome: Progressing   Problem: Health Behavior/Discharge Planning: Goal: Ability to manage health-related needs will improve Outcome: Progressing   Problem: Self-Care: Goal: Ability to participate in self-care as condition permits will improve Outcome: Progressing Goal: Ability to communicate needs accurately will improve Outcome: Progressing   Problem: Nutrition: Goal: Risk of aspiration will decrease Outcome: Progressing   Problem: Education: Goal: Knowledge of General Education information will improve Description: Including pain rating scale, medication(s)/side effects and non-pharmacologic comfort measures Outcome: Progressing   Problem: Health Behavior/Discharge Planning: Goal: Ability to manage health-related needs will improve Outcome: Progressing

## 2024-04-17 NOTE — Telephone Encounter (Signed)
 Per Dr. Willette, can we order 30 day monitor for afib eval in setting of stroke?   Pt enrolled in [employer] and order placed.

## 2024-04-17 NOTE — Consult Note (Signed)
 I connected with  Erica Graham on 04/17/24 by a video enabled telemedicine application and verified that I am speaking with the correct person using two identifiers.   I discussed the limitations of evaluation and management by telemedicine. The patient expressed understanding and agreed to proceed.  Location of patient: [facility] Additional physician: [facility]   Neurology Consultation Reason for Consult: Stroke Referring Physician: Dr.Seyed Shahmehdi  CC: Numbness in hands  History is obtained from: Patient, chart review  HPI: Erica Graham is a 58 y.o. female with prior history of stroke, left CEA, hypertension, hyperlipidemia, obesity, previous nicotine  use disorder, quit smoking since March of this year who presented with numbness in both hands and weakness since 7 PM on 04/15/2024.  Last known normal: 7 PM on 04/15/2024 Event happened at home No tPA or thrombectomy as outside window and no large vessel occlusion mRS 0  ROS: All other systems reviewed and negative except as noted in the HPI.   Past Medical History:  Diagnosis Date   Acute CVA (cerebrovascular accident) (HCC) 11/07/2023   Anxiety 03/05/2016   Arthritis    rheumatoid   Asthma    Body aches 03/05/2016   Depression    Emphysema of lung (HCC)    Fibromyalgia    GERD (gastroesophageal reflux disease)    Hyperlipidemia    Hypertension    Obesity    Recent onset of diabetes mellitus (HCC) 03/10/2016   Referred to Dr Lenis and rx metformin  500 mg bid and zocor  20 mg 1 daily   Varicose vein of leg 03/05/2016   Vitiligo 03/05/2016    Family History  Problem Relation Age of Onset   Cancer Paternal Grandfather        prostate   Diabetes Paternal Grandmother    Emphysema Maternal Grandmother    Cancer Maternal Grandmother        lung   Alcohol  abuse Maternal Grandfather    Diabetes Father    Hypertension Mother    Diabetes Mother    Crohn's disease Sister     Social History:   reports that she quit smoking about 5 months ago. Her smoking use included cigarettes. She has a 20 pack-year smoking history. She has never used smokeless tobacco. She reports current alcohol  use. She reports that she does not use drugs.   Medications Prior to Admission  Medication Sig Dispense Refill Last Dose/Taking   aspirin  EC 81 MG tablet Take 1 tablet (81 mg total) by mouth daily. Swallow whole. 30 tablet 0 04/16/2024 at  7:30 AM   calcium  carbonate (TUMS - DOSED IN MG ELEMENTAL CALCIUM ) 500 MG chewable tablet Chew 1 tablet by mouth as needed for indigestion or heartburn.   Past Week   empagliflozin  (JARDIANCE ) 10 MG TABS tablet Take 1 tablet (10 mg total) by mouth daily before breakfast. 30 tablet 5 04/16/2024 Morning      Exam: Current vital signs: BP (!) 150/69 (BP Location: Left Arm)   Pulse 76   Temp 97.7 F (36.5 C) (Oral)   Resp 16   Ht 5' 6 (1.676 m)   Wt 108 kg   SpO2 97%   BMI 38.43 kg/m  Vital signs in last 24 hours: Temp:  [97.7 F (36.5 C)-98.5 F (36.9 C)] 97.7 F (36.5 C) (09/08 0735) Pulse Rate:  [70-82] 76 (09/08 0735) Resp:  [12-23] 16 (09/08 0503) BP: (135-162)/(60-125) 150/69 (09/08 0735) SpO2:  [92 %-100 %] 97 % (09/08 0735) Weight:  [892.4  kg-108 kg] 108 kg (09/08 0503)   Physical Exam  Constitutional: Appears well-developed and well-nourished.  Psych: Affect appropriate to situation Neuro: AO x 3, no aphasia, cranial nerves grossly intact, antigravity strength without drift in upper extremities, decree sensation to light touch in bilateral upper extremities, FTN intact bilaterally  NIHSS 1   INPUTS: 1A: Level of consciousness --> 0 = Alert; keenly responsive 1B: Ask month and age --> 0 = Both questions right 1C: 'Blink eyes' & 'squeeze hands' --> 0 = Performs both tasks 2: Horizontal extraocular movements --> 0 = Normal 3: Visual fields --> 0 = No visual loss 4: Facial palsy --> 0 = Normal symmetry 5A: Left arm motor drift --> 0 = No drift  for 10 seconds 5B: Right arm motor drift --> 0 = No drift for 10 seconds 6A: Left leg motor drift --> 0 = No drift for 5 seconds 6B: Right leg motor drift --> 0 = No drift for 5 seconds 7: Limb Ataxia --> 0 = No ataxia 8: Sensation --> 1 = Mild-moderate loss: less sharp/more dull 9: Language/aphasia --> 0 = Normal; no aphasia 10: Dysarthria --> 0 = Normal 11: Extinction/inattention --> 0 = No abnormality   I have reviewed labs in epic and the results pertinent to this consultation are: CBC:  Recent Labs  Lab 04/16/24 1000 04/17/24 0457  WBC 6.8 5.5  NEUTROABS 5.8  --   HGB 16.9* 15.3*  HCT 50.4* 46.3*  MCV 89.0 89.9  PLT 167 156    Basic Metabolic Panel:  Lab Results  Component Value Date   NA 137 04/17/2024   K 4.3 04/17/2024   CO2 19 (L) 04/17/2024   GLUCOSE 145 (H) 04/17/2024   BUN 37 (H) 04/17/2024   CREATININE 1.76 (H) 04/17/2024   CALCIUM  8.6 (L) 04/17/2024   GFRNONAA 33 (L) 04/17/2024   GFRAA 55 (L) 05/27/2017   Lipid Panel:  Lab Results  Component Value Date   LDLCALC 153 (H) 04/16/2024   HgbA1c:  Lab Results  Component Value Date   HGBA1C 7.1 (H) 04/16/2024   Urine Drug Screen:     Component Value Date/Time   LABOPIA NONE DETECTED 11/08/2023 1221   COCAINSCRNUR NONE DETECTED 11/08/2023 1221   LABBENZ NONE DETECTED 11/08/2023 1221   AMPHETMU NONE DETECTED 11/08/2023 1221   THCU NONE DETECTED 11/08/2023 1221   LABBARB NONE DETECTED 11/08/2023 1221    Alcohol  Level No results found for: ETH   I have reviewed the images obtained:  MRI brain without contrast 04/16/2024: Multiple foci of restricted diffusion within the corpus callosum, primarily involving the body on the right and the splenium on the left, compatible with acute non-hemorrhagic infarcts. Chronic encephalomalacia changes medially within the left frontal lobe with blooming artifact in the left caudate nucleus, presumably from hemosiderin staining. Moderate cerebellar volume loss.  MRA  head without contrast 04/16/2024: No significant stenosis of the intracranial vasculature. Diminutive A1 segment of the left anterior cerebral artery.  Carotid ultrasound and echo pending    ASSESSMENT/PLAN: 58 year old female with multiple medical problems as noted above now with new strokes  Acute ischemic stroke - Etiology: Likely cardioembolic    Recommendations: - Patient was already on aspirin  81 mg daily.  Therefore recommend aspirin  81 mg daily and Plavix  75 mg daily for 3 weeks followed by Plavix  and 5 mg daily - Patient had stopped taking statin as her LDL had improved.  It is again elevated at 153.  Therefore recommend continuing rosuvastatin  40  mg daily - TTE pending.  If negative recommend cardiac monitor to look for A-fib - Patient is relatively young and has had multiple strokes.  Therefore we will get CT chest Abdo pelvis to look for occult malignancy - PT/OT - Follow-up with [facility] in 4 to 6 weeks - Discussed plan with patient and Dr. Willette  Thank you for allowing us  to participate in the care of this patient. If you have any further questions, please contact  me or neurohospitalist.   Arlin Krebs Epile[facility]

## 2024-04-17 NOTE — Plan of Care (Signed)

## 2024-04-17 NOTE — Evaluation (Signed)
 Physical Therapy Evaluation Patient Details Name: Erica Graham MRN: [id number] DOB: [birth year] Today's Date: 04/17/2024  History of Present Illness  Erica Graham is a 58 year old female with past medical history of CVA, HTN, HLD, DM2, GERD, presenting with chief complaint of bilateral upper extremity numbness and weakness.  Symptoms started approximately 7 PM last night.  Also vague lower extremity weakness.    Patient has a history of previous CVAs in March, with left ICA workup in April 2025.     Patient reports of no, double vision, headaches or dizziness.  Denies any neck pain back pain, shortness of breath or chest pain.   Clinical Impression  Patient functioning at baseline for functional mobility and gait demonstrating independence with all functional tasks. Patient demonstrates ability to safely perform distance walking without AD with no observable loss of balance. However c/o BIL LE feeling heavier than normal though no objective or functional deficits associated. There are no immediate concerns at this time. Patient discharged to care of nursing for ambulation daily as tolerated for length of stay.        If plan is discharge home, recommend the following:     Can travel by private vehicle        Equipment Recommendations None recommended by PT  Recommendations for Other Services       Functional Status Assessment Patient has not had a recent decline in their functional status     Precautions / Restrictions Precautions Precautions: Fall Recall of Precautions/Restrictions: Intact Restrictions Weight Bearing Restrictions Per Provider Order: No      Mobility  Bed Mobility Overal bed mobility: Independent                  Transfers Overall transfer level: Independent                      Ambulation/Gait Ambulation/Gait assistance: Independent Gait Distance (Feet): 100 Feet Assistive device: None Gait Pattern/deviations: Step-through  pattern Gait velocity: WFL     General Gait Details: Pt demonstrates good return for ambulating in room/hallway without loss of balance. Patient presents with slightly increased sway during ambulation, c/o of BIL LEs feeling heavy  Stairs            Wheelchair Mobility     Tilt Bed    Modified Rankin (Stroke Patients Only)       Balance Overall balance assessment: Mild deficits observed, not formally tested                                           Pertinent Vitals/Pain Pain Assessment Pain Assessment: No/denies pain    Home Living Family/patient expects to be discharged to:: Private residence Living Arrangements: Alone Available Help at Discharge: Family;Available PRN/intermittently Type of Home: House Home Access: Level entry       Home Layout: One level Home Equipment: None      Prior Function Prior Level of Function : Independent/Modified Independent;Driving             Mobility Comments: independent ADLs Comments: independent with ADLs and IADLs     Extremity/Trunk Assessment   Upper Extremity Assessment Upper Extremity Assessment: Defer to OT evaluation    Lower Extremity Assessment Lower Extremity Assessment: Overall WFL for tasks assessed (Reports sensation feels lighter than normal and LEs feel heavy; No objective weakness or functional deficits)  Cervical / Trunk Assessment Cervical / Trunk Assessment: Normal  Communication   Communication Communication: No apparent difficulties    Cognition Arousal: Alert     PT - Cognitive impairments: No apparent impairments                         Following commands: Intact       Cueing Cueing Techniques: Verbal cues     General Comments      Exercises     Assessment/Plan    PT Assessment Patient does not need any further PT services  PT Problem List         PT Treatment Interventions      PT Goals (Current goals can be found in the Care  Plan section)  Acute Rehab PT Goals Patient Stated Goal: return home PT Goal Formulation: With patient Time For Goal Achievement: 04/17/24 Potential to Achieve Goals: Good    Frequency       Co-evaluation PT/OT/SLP Co-Evaluation/Treatment: Yes Reason for Co-Treatment: To address functional/ADL transfers PT goals addressed during session: Mobility/safety with mobility;Balance OT goals addressed during session: ADL's and self-care       AM-PAC PT 6 Clicks Mobility  Outcome Measure Help needed turning from your back to your side while in a flat bed without using bedrails?: None Help needed moving from lying on your back to sitting on the side of a flat bed without using bedrails?: None Help needed moving to and from a bed to a chair (including a wheelchair)?: None Help needed standing up from a chair using your arms (e.g., wheelchair or bedside chair)?: None Help needed to walk in hospital room?: None Help needed climbing 3-5 steps with a railing? : None 6 Click Score: 24    End of Session   Activity Tolerance: Patient tolerated treatment well Patient left: in bed;with call bell/phone within reach Nurse Communication: Mobility status PT Visit Diagnosis: Unsteadiness on feet (R26.81);Other abnormalities of gait and mobility (R26.89);Muscle weakness (generalized) (M62.81)    Time: 9184-9164 PT Time Calculation (min) (ACUTE ONLY): 20 min   Charges:   PT Evaluation $PT Eval Moderate Complexity: 1 Mod PT Treatments $Therapeutic Activity: 8-22 mins PT General Charges $$ ACUTE PT VISIT: 1 Visit        2:05 PM, 04/17/24,  Nakiesha Rumsey, SPT

## 2024-04-17 NOTE — Progress Notes (Signed)
 PROGRESS NOTE    Patient: Erica Graham                            PCP: Bevely Doffing, FNP                    DOB: [birth year]            DOA: 04/16/2024 FMW:[id number]             DOS: 04/17/2024, 3:39 PM   LOS: 0 days   Date of Service: The patient was seen and examined on 04/17/2024  Subjective:   The patient was seen and examined this morning. Hemodynamically stable.  Reporting of no progression of symptoms to further weakness denies any headaches visual changes or asymmetric weaknesses Continues to report some subjective numbness in upper extremities No issues overnight .  Brief Narrative:   Erica Graham is a 58 year old female with past medical history of CVA, HTN, HLD, DM2, GERD, presenting with chief complaint of bilateral upper extremity numbness and weakness.  Symptoms started approximately 7 PM last night. Also vague lower extremity weakness.   Patient has a history of previous CVAs in March, with left ICA workup in April 2025.  Patient reports of no, double vision, headaches or dizziness.  Denies any neck pain back pain, shortness of breath or chest pain.    ED Evaluation; Blood pressure (!) 155/80, pulse 78, temperature 98.3 F RR 16, height 5' 6 (1.676 m), weight 108.4 kg, SpO2 100%.  LABs; within normal limits with exception of sodium 134, CO2 17 BUN 49 creatinine 1.94,  UA: Clear,  negative for ketones, leukocytes, rare bacteria WBC 0-5  MRI/ MRA Brain:  Multiple foci of restricted diffusion within the corpus callosum, primarily involving the body on the right and the splenium on the left, compatible with acute non-hemorrhagic infarcts.  MRA:  1. No significant stenosis of the intracranial vasculature. 2. Diminutive A1 segment of the left anterior cerebral artery.    Requested patient to be admitted for acute stroke    Assessment & Plan:   Principal Problem:   Acute CVA (cerebrovascular accident) [facility]) Active Problems:   Fibromyalgia   Essential  hypertension   Acute kidney injury superimposed on chronic kidney disease (HCC)   Anxiety   Mixed hyperlipidemia   Morbid obesity due to excess calories (HCC)   Rheumatoid arthritis (HCC)   Smoker   Controlled type 2 diabetes mellitus without complication, without long-term current use of insulin  (HCC)   Carotid stenosis   Stroke (HCC)     Assessment and Plan: * Acute CVA (cerebrovascular accident) (HCC) - Upper extremity weakness, numbness  MRI/ MRA Brain:  Multiple foci of restricted diffusion within the corpus callosum, primarily involving the body on the right and the splenium on the left, compatible with acute non-hemorrhagic infarcts.  MRA: No significant intracranial vascular stenosis Diminutive A1 segment of the left anterior cerebral artery.  - Admitted for stroke workup -Consulting neurology-recommending further investigation for recurrent stroke Neurology recommended CT chest abdomen pelvis-to rule out neoplasm  -2D echocardiogram:  - Consulting PT OT/speech for evaluation recommendations - -LDL at 153 -goal <  70 -A1c 7.1 -TSH 3.17 - Neurochecks no new focal neurological findings  -Continue with aspirin , Plavix , high-dose statins   Acute kidney injury superimposed on chronic kidney disease (HCC) - Monitor BUN/creatinine closely - Avoid nephrotoxin, hypotension, Lab Results  Component Value Date   CREATININE 1.76 (H)  04/17/2024   CREATININE 1.94 (H) 04/16/2024   CREATININE 1.94 (H) 01/20/2024      Essential hypertension - Withholding BP meds, allowing for permissive hypertension   Fibromyalgia Monitoring not on any home meds  Anxiety - Currently stable, monitoring, continue as needed Xanax  Stroke [facility]) - Previous episodes of stroke earlier this year March  - Initially was treated with aspirin  Plavix , statins Per patient she took herself off statin since heart cholesterols were normal Further workup for recurrent stroke findings as  above  Carotid stenosis Previous workup:  Carotid ultrasound concerning for left ICA occlusion with left ACA retrograde flow,  -Carotid angiography done - 50% stenosis at the ostia of the CCA. retrograde filling of the ECA, with antegrade filling of the ICA which is greater than 99% stenosis.  - CEA performed on 4/7. - On statins (Crestor  20 mg) and low-dose aspirin  (Patient took herself off statins)   Controlled type 2 diabetes mellitus without complication, without long-term current use of insulin  (HCC) - Checking hemoglobin A1c 7.1 -Will monitor CBG q. ACHS, SSI coverage - Reviewing home regimen-on Jardiance  10 mg daily  Smoker - Discussed with patient regarding smoking cessation NicoDerm patch offered  Rheumatoid arthritis (HCC) - Reviewing home medication Continue NSAIDs as needed   Morbid obesity due to excess calories (HCC) Body mass index is 38.58 kg/m. Discussed with patient regarding benefit of weight loss Patient is to follow-up with the PCP regarding weight loss programs and medications  Mixed hyperlipidemia - Continue with high-dose statins - Patient reports that she stopped taking her Crestor  as she noted that her cholesterol level was within normal limits on her last visit with her doctors Lipid Panel  Lipid Panel     Component Value Date/Time   CHOL 224 (H) 04/16/2024 1000   CHOL 188 03/05/2016 1127   TRIG 140 04/16/2024 1000   HDL 43 04/16/2024 1000   HDL 35 (L) 03/05/2016 1127   CHOLHDL 5.2 04/16/2024 1000   VLDL 28 04/16/2024 1000   LDLCALC 153 (H) 04/16/2024 1000   LDLCALC 116 (H) 05/27/2017 0831   LABVLDL 27 03/05/2016 1127    -------------------------------------------------------------------------------------------------------------------------------- Nutritional status:  The patient's BMI is: Body mass index is 38.43 kg/m. I agree with the assessment and plan as outlined  -----------------------------------------------------------------------------------------------------------------------------  DVT prophylaxis:  heparin  injection 5,000 Units Start: 04/16/24 2200 SCDs Start: 04/16/24 1400   Code Status:   Code Status: Full Code  Family Communication: No family member present at bedside-  -Advance care planning has been discussed.   Admission status:   Status is: Observation The patient remains OBS appropriate and will d/c before 2 midnights.   Disposition: From  - home             Planning for discharge in 1 days   Procedures:   No admission procedures for hospital encounter.   Antimicrobials:  Anti-infectives (From admission, onward)    None        Medication:   aspirin  EC  81 mg Oral Daily   clopidogrel   75 mg Oral Daily   empagliflozin   10 mg Oral QAC breakfast   heparin   5,000 Units Subcutaneous Q8H   insulin  aspart  0-15 Units Subcutaneous TID WC   pantoprazole   40 mg Oral Daily   rosuvastatin   40 mg Oral Daily   sodium chloride  flush  3 mL Intravenous Q12H   sodium chloride  flush  3 mL Intravenous Q12H    acetaminophen  **OR** acetaminophen , hydrALAZINE , HYDROmorphone  (DILAUDID ) injection, ipratropium,  levalbuterol , ondansetron  **OR** ondansetron  (ZOFRAN ) IV, oxyCODONE , senna-docusate, sodium phosphate , traZODone    Objective:   Vitals:   04/16/24 2038 04/17/24 0503 04/17/24 0735 04/17/24 1140  BP: (!) 142/60 (!) 155/71 (!) 150/69 (!) 154/69  Pulse: 77 72 76 78  Resp: 19 16  17   Temp: 98.4 F (36.9 C) 98 F (36.7 C) 97.7 F (36.5 C) 98.5 F (36.9 C)  TempSrc: Oral Oral Oral Oral  SpO2: 92% 95% 97% 98%  Weight:  108 kg    Height:       No intake or output data in the 24 hours ending 04/17/24 1539 Filed Weights   04/16/24 1005 04/16/24 1655 04/17/24 0503  Weight: 108.4 kg 107.5 kg 108 kg     Physical examination:   General:  AAO x 3,  cooperative, no distress;   HEENT:  Normocephalic, PERRL, otherwise with  in Normal limits   Neuro:  CNII-XII intact. , normal motor and sensation, reflexes intact   Lungs:   Clear to auscultation BL, Respirations unlabored,  No wheezes / crackles  Cardio:    S1/S2, RRR, No murmure, No Rubs or Gallops   Abdomen:  Soft, non-tender, bowel sounds active all four quadrants, no guarding or peritoneal signs.  Muscular  skeletal:  Limited exam -global generalized weaknesses - in bed, able to move all 4 extremities,   2+ pulses,  symmetric, No pitting edema  Skin:  Dry, warm to touch, negative for any Rashes,  Wounds: Please see nursing documentation      ---------------------------------------------------------------------------------------------------------------------------    LABs:     Latest Ref Rng & Units 04/17/2024    4:57 AM 04/16/2024   10:00 AM 11/16/2023    4:50 AM  CBC  WBC 4.0 - 10.5 K/uL 5.5  6.8  11.7   Hemoglobin 12.0 - 15.0 g/dL 84.6  83.0  87.9   Hematocrit 36.0 - 46.0 % 46.3  50.4  35.4   Platelets 150 - 400 K/uL 156  167  170       Latest Ref Rng & Units 04/17/2024    4:57 AM 04/16/2024   10:00 AM 01/20/2024   11:07 AM  CMP  Glucose 70 - 99 mg/dL 854  798  856   BUN 6 - 20 mg/dL 37  49  39   Creatinine 0.44 - 1.00 mg/dL 8.23  8.05  8.05   Sodium 135 - 145 mmol/L 137  134  137   Potassium 3.5 - 5.1 mmol/L 4.3  4.4  5.0   Chloride 98 - 111 mmol/L 112  107  103   CO2 22 - 32 mmol/L 19  17  16    Calcium  8.9 - 10.3 mg/dL 8.6  9.1  9.5   Total Protein 6.5 - 8.1 g/dL  8.0  7.0   Total Bilirubin 0.0 - 1.2 mg/dL  0.9  0.5   Alkaline Phos 38 - 126 U/L  106  113   AST 15 - 41 U/L  29  34   ALT 0 - 44 U/L  43  34        Micro Results No results found for this or any previous visit (from the past 240 hours).  Radiology Reports No results found.  SIGNED: Adriana DELENA Grams, MD, FHM. FAAFP. [facility] - [facility] Time spent - 55 min.  In seeing, evaluating and examining the patient. Reviewing medical records, labs, drawn plan of  care. [facility],  Pager (please use [email or web address] to page/ text) Please use  [employer] Secure Chat for non-urgent communication (7AM-7PM)  If 7PM-7AM, please contact night-coverage [email or web address], 04/17/2024, 3:39 PM

## 2024-04-17 NOTE — Evaluation (Signed)
 Occupational Therapy Evaluation Patient Details Name: Erica Graham MRN: [id number] DOB: [birth year] Today's Date: 04/17/2024   History of Present Illness   Erica Graham is a 58 year old female with past medical history of CVA, HTN, HLD, DM2, GERD, presenting with chief complaint of bilateral upper extremity numbness and weakness.  Symptoms started approximately 7 PM last night.  Also vague lower extremity weakness.    Patient has a history of previous CVAs in March, with left ICA workup in April 2025.     Patient reports of no, double vision, headaches or dizziness.  Denies any neck pain back pain, shortness of breath or chest pain. (per MD)     Clinical Impressions Pt appears to be near baseline function with the only deficit of note being full body numbness leading to lighter sensation in both B UE and B LE. Functionally pt is independent with reports of not feeling fully normal when ambulating but no physical assist needed. Pt left in the bed with call bel within reach. Pt is not recommended for further acute OT services and will be discharged to care of nursing staff for remaining length of stay.               Functional Status Assessment   Patient has not had a recent decline in their functional status     Equipment Recommendations   None recommended by OT             Precautions/Restrictions   Precautions Precautions: Fall Recall of Precautions/Restrictions: Intact Restrictions Weight Bearing Restrictions Per Provider Order: No     Mobility Bed Mobility Overal bed mobility: Independent                  Transfers Overall transfer level: Independent                        Balance Overall balance assessment: Mild deficits observed, not formally tested                                         ADL either performed or assessed with clinical judgement   ADL Overall ADL's : Independent                                        General ADL Comments: Able to ambualte without assist. Able to demonstrate independence with ADL's per clinical judgement.     Vision Baseline Vision/History: 1 Wears glasses Ability to See in Adequate Light: 0 Adequate Patient Visual Report: No change from baseline Vision Assessment?: Wears glasses for reading;No apparent visual deficits     Perception Perception: Not tested       Praxis Praxis: Not tested       Pertinent Vitals/Pain Pain Assessment Pain Assessment: No/denies pain (reports full body numbness)     Extremity/Trunk Assessment Upper Extremity Assessment Upper Extremity Assessment: Overall WFL for tasks assessed (Pt reports that sensation is lighter of B LE and B UE. No functional deficits.)   Lower Extremity Assessment Lower Extremity Assessment: Defer to PT evaluation   Cervical / Trunk Assessment Cervical / Trunk Assessment: Normal   Communication Communication Communication: No apparent difficulties   Cognition Arousal: Alert Behavior During Therapy: WFL for tasks assessed/performed Cognition: No apparent impairments  Following commands: Intact       Cueing  General Comments   Cueing Techniques: Verbal cues                 Home Living Family/patient expects to be discharged to:: Private residence Living Arrangements: Alone Available Help at Discharge: Family;Available PRN/intermittently Type of Home: House Home Access: Level entry     Home Layout: One level     Bathroom Shower/Tub: Chief Strategy Officer: Handicapped height     Home Equipment: None          Prior Functioning/Environment Prior Level of Function : Independent/Modified Independent;Driving             Mobility Comments: independent ADLs Comments: independent with ADLs and IADLs                            Co-evaluation PT/OT/SLP Co-Evaluation/Treatment:  Yes Reason for Co-Treatment: To address functional/ADL transfers   OT goals addressed during session: ADL's and self-care      AM-PAC OT 6 Clicks Daily Activity     Outcome Measure Help from another person eating meals?: None Help from another person taking care of personal grooming?: None Help from another person toileting, which includes using toliet, bedpan, or urinal?: None Help from another person bathing (including washing, rinsing, drying)?: None Help from another person to put on and taking off regular upper body clothing?: None Help from another person to put on and taking off regular lower body clothing?: None 6 Click Score: 24   End of Session    Activity Tolerance: Patient tolerated treatment well Patient left: in bed;with call bell/phone within reach  OT Visit Diagnosis: Other symptoms and signs involving the nervous system (M70.101)                Time: 9175-9164 OT Time Calculation (min): 11 min Charges:  OT General Charges $OT Visit: 1 Visit OT Evaluation $OT Eval Low Complexity: 1 Low  Chellsea Beckers OT, MOT  Jayson Person 04/17/2024, 11:53 AM

## 2024-04-17 NOTE — Plan of Care (Signed)
   Problem: Education: Goal: Ability to describe self-care measures that may prevent or decrease complications (Diabetes Survival Skills Education) will improve Outcome: Progressing Goal: Individualized Educational Video(s) Outcome: Progressing   Problem: Coping: Goal: Ability to adjust to condition or change in health will improve Outcome: Progressing   Problem: Fluid Volume: Goal: Ability to maintain a balanced intake and output will improve Outcome: Progressing   Problem: Skin Integrity: Goal: Risk for impaired skin integrity will decrease Outcome: Progressing

## 2024-04-17 NOTE — Progress Notes (Signed)
 SLP Cancellation Note  Patient Details Name: Erica Graham MRN: [id number] DOB: [birth year]   Cancelled treatment:       Reason Eval/Treat Not Completed: SLP screened, no needs identified, will sign off; SLP screened Pt in room. Pt denies any changes in swallowing, speech, language, or cognition and this is confirmed by family. SLE will be deferred at this time. Reconsult if indicated. SLP will sign off.   Thank you,  Lamar Candy, CCC-SLP [phone]  Gina Costilla 04/17/2024, 1:40 PM

## 2024-04-17 NOTE — Progress Notes (Addendum)
   04/17/24 1159  TOC Brief Assessment  Insurance and Status Reviewed  Patient has primary care physician Yes  Home environment has been reviewed Home with children  Prior level of function: independent  Prior/Current Home Services No current home services  Social Drivers of Health Review SDOH reviewed no interventions necessary  Readmission risk has been reviewed Yes  Transition of care needs no transition of care needs at this time    PT's recommendations was no needs. ECHO pending, plans to discharge home later.  Transition of Care Department [facility]) has reviewed patient and no TOC needs have been identified at this time. We will continue to monitor patient advancement through interdisciplinary progression rounds. If new patient transition needs arise, please place a TOC consult.

## 2024-04-18 DIAGNOSIS — I639 Cerebral infarction, unspecified: Secondary | ICD-10-CM | POA: Diagnosis not present

## 2024-04-18 LAB — GLUCOSE, CAPILLARY: Glucose-Capillary: 150 mg/dL — ABNORMAL HIGH (ref 70–99)

## 2024-04-18 LAB — BASIC METABOLIC PANEL WITH GFR
Anion gap: 6 (ref 5–15)
BUN: 38 mg/dL — ABNORMAL HIGH (ref 6–20)
CO2: 20 mmol/L — ABNORMAL LOW (ref 22–32)
Calcium: 8.5 mg/dL — ABNORMAL LOW (ref 8.9–10.3)
Chloride: 111 mmol/L (ref 98–111)
Creatinine, Ser: 2 mg/dL — ABNORMAL HIGH (ref 0.44–1.00)
GFR, Estimated: 28 mL/min — ABNORMAL LOW (ref >60)
Glucose, Bld: 150 mg/dL — ABNORMAL HIGH (ref 70–99)
Potassium: 4.1 mmol/L (ref 3.5–5.1)
Sodium: 137 mmol/L (ref 135–145)

## 2024-04-18 LAB — CBC
HCT: 46 % (ref 36.0–46.0)
Hemoglobin: 14.9 g/dL (ref 12.0–15.0)
MCH: 29.7 pg (ref 26.0–34.0)
MCHC: 32.4 g/dL (ref 30.0–36.0)
MCV: 91.6 fL (ref 80.0–100.0)
Platelets: 152 K/uL (ref 150–400)
RBC: 5.02 MIL/uL (ref 3.87–5.11)
RDW: 14.3 % (ref 11.5–15.5)
WBC: 5.5 K/uL (ref 4.0–10.5)
nRBC: 0 % (ref 0.0–0.2)

## 2024-04-18 MED ORDER — PANTOPRAZOLE SODIUM 40 MG PO TBEC: 40.0000 mg | DELAYED_RELEASE_TABLET | Freq: Every day | ORAL | 0 refills | Status: DC

## 2024-04-18 MED ORDER — CLOPIDOGREL BISULFATE 75 MG PO TABS: 75.0000 mg | ORAL_TABLET | Freq: Every day | ORAL | 2 refills | Status: DC

## 2024-04-18 MED ORDER — ROSUVASTATIN CALCIUM 40 MG PO TABS: 40.0000 mg | ORAL_TABLET | Freq: Every day | ORAL | 2 refills | Status: DC

## 2024-04-18 MED ORDER — ASPIRIN 81 MG PO TBEC: 81.0000 mg | DELAYED_RELEASE_TABLET | Freq: Every day | ORAL | 0 refills | Status: AC

## 2024-04-18 NOTE — Plan of Care (Signed)
 Problem: Education: Goal: Ability to describe self-care measures that may prevent or decrease complications (Diabetes Survival Skills Education) will improve 04/18/2024 1244 by Erica Norleen POUR, RN Outcome: Adequate for Discharge 04/18/2024 [phone] by Erica Norleen POUR, RN Outcome: Progressing Goal: Individualized Educational Video(s) 04/18/2024 1244 by Erica Norleen POUR, RN Outcome: Adequate for Discharge 04/18/2024 [phone] by Erica Norleen POUR, RN Outcome: Progressing   Problem: Coping: Goal: Ability to adjust to condition or change in health will improve 04/18/2024 1244 by Erica Norleen POUR, RN Outcome: Adequate for Discharge 04/18/2024 [phone] by Erica Norleen POUR, RN Outcome: Progressing   Problem: Fluid Volume: Goal: Ability to maintain a balanced intake and output will improve 04/18/2024 1244 by Erica Norleen POUR, RN Outcome: Adequate for Discharge 04/18/2024 [phone] by Erica Norleen POUR, RN Outcome: Progressing   Problem: Health Behavior/Discharge Planning: Goal: Ability to identify and utilize available resources and services will improve 04/18/2024 1244 by Erica Norleen POUR, RN Outcome: Adequate for Discharge 04/18/2024 [phone] by Erica Norleen POUR, RN Outcome: Progressing Goal: Ability to manage health-related needs will improve 04/18/2024 1244 by Erica Norleen POUR, RN Outcome: Adequate for Discharge 04/18/2024 [phone] by Erica Norleen POUR, RN Outcome: Progressing   Problem: Metabolic: Goal: Ability to maintain appropriate glucose levels will improve 04/18/2024 1244 by Erica Norleen POUR, RN Outcome: Adequate for Discharge 04/18/2024 [phone] by Erica Norleen POUR, RN Outcome: Progressing   Problem: Nutritional: Goal: Maintenance of adequate nutrition will improve 04/18/2024 1244 by Erica Norleen POUR, RN Outcome: Adequate for Discharge 04/18/2024 [phone] by Erica Norleen POUR, RN Outcome: Progressing Goal: Progress toward achieving an optimal weight will improve 04/18/2024 1244 by Erica Norleen POUR, RN Outcome: Adequate for Discharge 04/18/2024 [phone] by Erica Norleen POUR,  RN Outcome: Progressing   Problem: Skin Integrity: Goal: Risk for impaired skin integrity will decrease 04/18/2024 1244 by Erica Norleen POUR, RN Outcome: Adequate for Discharge 04/18/2024 [phone] by Erica Norleen POUR, RN Outcome: Progressing   Problem: Tissue Perfusion: Goal: Adequacy of tissue perfusion will improve 04/18/2024 1244 by Erica Norleen POUR, RN Outcome: Adequate for Discharge 04/18/2024 [phone] by Erica Norleen POUR, RN Outcome: Progressing   Problem: Education: Goal: Knowledge of disease or condition will improve 04/18/2024 1244 by Erica Norleen POUR, RN Outcome: Adequate for Discharge 04/18/2024 [phone] by Erica Norleen POUR, RN Outcome: Progressing Goal: Knowledge of secondary prevention will improve (MUST DOCUMENT ALL) 04/18/2024 1244 by Erica Norleen POUR, RN Outcome: Adequate for Discharge 04/18/2024 [phone] by Erica Norleen POUR, RN Outcome: Progressing Goal: Knowledge of patient specific risk factors will improve (DELETE if not current risk factor) 04/18/2024 1244 by Erica Norleen POUR, RN Outcome: Adequate for Discharge 04/18/2024 [phone] by Erica Norleen POUR, RN Outcome: Progressing   Problem: Ischemic Stroke/TIA Tissue Perfusion: Goal: Complications of ischemic stroke/TIA will be minimized 04/18/2024 1244 by Erica Norleen POUR, RN Outcome: Adequate for Discharge 04/18/2024 [phone] by Erica Norleen POUR, RN Outcome: Progressing   Problem: Coping: Goal: Will verbalize positive feelings about self 04/18/2024 1244 by Erica Norleen POUR, RN Outcome: Adequate for Discharge 04/18/2024 [phone] by Erica Norleen POUR, RN Outcome: Progressing Goal: Will identify appropriate support needs 04/18/2024 1244 by Erica Norleen POUR, RN Outcome: Adequate for Discharge 04/18/2024 [phone] by Erica Norleen POUR, RN Outcome: Progressing   Problem: Health Behavior/Discharge Planning: Goal: Ability to manage health-related needs will improve 04/18/2024 1244 by Erica Norleen POUR, RN Outcome: Adequate for Discharge 04/18/2024 [phone] by Erica Norleen POUR, RN Outcome: Progressing Goal: Goals will be  collaboratively established with patient/family 04/18/2024 1244 by Erica Graham, Norleen POUR, RN  Outcome: Adequate for Discharge 04/18/[phone] by Erica Norleen POUR, RN Outcome: Progressing   Problem: Self-Care: Goal: Ability to participate in self-care as condition permits will improve 04/18/2024 1244 by Erica Norleen POUR, RN Outcome: Adequate for Discharge 04/18/2024 [phone] by Erica Norleen POUR, RN Outcome: Progressing Goal: Verbalization of feelings and concerns over difficulty with self-care will improve 04/18/2024 1244 by Erica Graham, Norleen POUR, RN Outcome: Adequate for Discharge 04/18/2024 [phone] by Erica Norleen POUR, RN Outcome: Progressing Goal: Ability to communicate needs accurately will improve 04/18/2024 1244 by Erica Graham, Norleen POUR, RN Outcome: Adequate for Discharge 04/18/2024 [phone] by Erica Norleen POUR, RN Outcome: Progressing   Problem: Nutrition: Goal: Risk of aspiration will decrease 04/18/2024 1244 by Erica Norleen POUR, RN Outcome: Adequate for Discharge 04/18/2024 [phone] by Erica Norleen POUR, RN Outcome: Progressing Goal: Dietary intake will improve 04/18/2024 1244 by Erica Norleen POUR, RN Outcome: Adequate for Discharge 04/18/2024 [phone] by Erica Norleen POUR, RN Outcome: Progressing   Problem: Education: Goal: Knowledge of General Education information will improve Description: Including pain rating scale, medication(s)/side effects and non-pharmacologic comfort measures 04/18/2024 1244 by Erica Norleen POUR, RN Outcome: Adequate for Discharge 04/18/2024 [phone] by Erica Norleen POUR, RN Outcome: Progressing   Problem: Health Behavior/Discharge Planning: Goal: Ability to manage health-related needs will improve 04/18/2024 1244 by Erica Norleen POUR, RN Outcome: Adequate for Discharge 04/18/2024 [phone] by Erica Norleen POUR, RN Outcome: Progressing   Problem: Clinical Measurements: Goal: Ability to maintain clinical measurements within normal limits will improve 04/18/2024 1244 by Erica Norleen POUR, RN Outcome: Adequate for Discharge 04/18/2024 [phone] by Erica Norleen POUR,  RN Outcome: Progressing Goal: Will remain free from infection 04/18/2024 1244 by Erica Norleen POUR, RN Outcome: Adequate for Discharge 04/18/2024 [phone] by Erica Norleen POUR, RN Outcome: Progressing Goal: Diagnostic test results will improve 04/18/2024 1244 by Erica Norleen POUR, RN Outcome: Adequate for Discharge 04/18/2024 [phone] by Erica Norleen POUR, RN Outcome: Progressing Goal: Respiratory complications will improve 04/18/2024 1244 by Erica Norleen POUR, RN Outcome: Adequate for Discharge 04/18/2024 [phone] by Erica Norleen POUR, RN Outcome: Progressing Goal: Cardiovascular complication will be avoided 04/18/2024 1244 by Erica Norleen POUR, RN Outcome: Adequate for Discharge 04/18/2024 [phone] by Erica Norleen POUR, RN Outcome: Progressing   Problem: Activity: Goal: Risk for activity intolerance will decrease 04/18/2024 1244 by Erica Norleen POUR, RN Outcome: Adequate for Discharge 04/18/2024 [phone] by Erica Norleen POUR, RN Outcome: Progressing   Problem: Nutrition: Goal: Adequate nutrition will be maintained 04/18/2024 1244 by Erica Norleen POUR, RN Outcome: Adequate for Discharge 04/18/2024 [phone] by Erica Norleen POUR, RN Outcome: Progressing   Problem: Coping: Goal: Level of anxiety will decrease 04/18/2024 1244 by Erica Norleen POUR, RN Outcome: Adequate for Discharge 04/18/2024 [phone] by Erica Norleen POUR, RN Outcome: Progressing   Problem: Elimination: Goal: Will not experience complications related to bowel motility 04/18/2024 1244 by Erica Norleen POUR, RN Outcome: Adequate for Discharge 04/18/2024 [phone] by Erica Norleen POUR, RN Outcome: Progressing Goal: Will not experience complications related to urinary retention 04/18/2024 1244 by Erica Norleen POUR, RN Outcome: Adequate for Discharge 04/18/2024 [phone] by Erica Norleen POUR, RN Outcome: Progressing   Problem: Pain Managment: Goal: General experience of comfort will improve and/or be controlled 04/18/2024 1244 by Erica Norleen POUR, RN Outcome: Adequate for Discharge 04/18/2024 [phone] by Erica Norleen POUR, RN Outcome: Progressing    Problem: Safety: Goal: Ability to remain free from injury will improve 04/18/2024 1244 by Erica Norleen POUR, RN Outcome: Adequate for Discharge 04/18/2024 [phone] by Erica Norleen POUR, RN  Outcome: Progressing   Problem: Skin Integrity: Goal: Risk for impaired skin integrity will decrease 04/18/2024 1244 by Erica Norleen POUR, RN Outcome: Adequate for Discharge 04/18/2024 [phone] by Erica Norleen POUR, RN Outcome: Progressing

## 2024-04-18 NOTE — Plan of Care (Signed)

## 2024-04-18 NOTE — Discharge Summary (Addendum)
 Physician Discharge Summary   Patient: Erica Graham MRN: [id number] DOB: Dec 11, 1965  Admit date:     04/16/2024  Discharge date: 04/18/24  Discharge Physician: Adriana DELENA Grams   PCP: Bevely Doffing, FNP   Recommendations at discharge:   - Follow-up with PCP and 1-4 weeks. - Aspirin  81 mg +Plavix  75 mg daily for 3 weeks, then Followed by Plavix  and 75 mg daily -LDL 153.  Goal less than 70. Continuing rosuvastatin  40 mg daily - Follow-up with Cardiac monitor to look for A-fib - Follow-up with [facility] in 2-4  weeks  Discharge Diagnoses: Principal Problem:   Acute CVA (cerebrovascular accident) [facility]) Active Problems:   Fibromyalgia   Essential hypertension   Acute kidney injury superimposed on chronic kidney disease (HCC)   Anxiety   Mixed hyperlipidemia   Morbid obesity due to excess calories (HCC)   Rheumatoid arthritis (HCC)   Smoker   Controlled type 2 diabetes mellitus without complication, without long-term current use of insulin  (HCC)   Carotid stenosis   Stroke [facility])  Hospital Course: Erica Graham is a 58 year old female with past medical history of CVA, HTN, HLD, DM2, GERD, presenting with chief complaint of bilateral upper extremity numbness and weakness.  Symptoms started approximately 7 PM last night. Also vague lower extremity weakness.   Patient has a history of previous CVAs in March, with left ICA workup in April 2025.  Patient reports of no, double vision, headaches or dizziness.  Denies any neck pain back pain, shortness of breath or chest pain.    ED Evaluation; Blood pressure (!) 155/80, pulse 78, temperature 98.3 F RR 16, height 5' 6 (1.676 m), weight 108.4 kg, SpO2 100%.  LABs; within normal limits with exception of sodium 134, CO2 17 BUN 49 creatinine 1.94,  UA: Clear,  negative for ketones, leukocytes, rare bacteria WBC 0-5  MRI/ MRA Brain:  Multiple foci of restricted diffusion within the corpus callosum, primarily  involving the body on the right and the splenium on the left, compatible with acute non-hemorrhagic infarcts.  MRA:  1. No significant stenosis of the intracranial vasculature. 2. Diminutive A1 segment of the left anterior cerebral artery.    Requested patient to be admitted for acute stroke    * Acute CVA (cerebrovascular accident) (HCC) - Upper extremity weakness, numbness   MRI/ MRA Brain:  Multiple foci of restricted diffusion within the corpus callosum, primarily involving the body on the right and the splenium on the left, compatible with acute non-hemorrhagic infarcts.   MRA: No significant intracranial vascular stenosis Diminutive A1 segment of the left anterior cerebral artery.   -Status post complete neurowork-up -Consulting neurology-recommending further investigation for recurrent stroke Neurology recommended CT chest abdomen pelvis-to rule out neoplasm CT chest, abdomen, pelvis reviewed in detail-see report below Negative for any acute findings or neoplasms.   -2D echocardiogram: Reviewed, no structural abnormalities,60 to 65%. The  left ventricle has normal function. The left ventricle has no regional  wall motion abnormalities. There is mild left ventricular hypertrophy.  Left ventricular diastolic parameters  are consistent with Grade I diastolic dysfunction (impaired relaxation).  Elevated left ventricular end-diastolic pressure.    - Consulting PT OT/speech for evaluation recommendations - -LDL at 153 -goal <  70 -A1c 7.1 -TSH 3.17 - Neurochecks no new focal neurological findings   -Continue with aspirin , Plavix , Crestor   Neurology recommendation Aspirin  81 mg plus Plavix  75 mg x 3 weeks then continue with Plavix  75 mg daily, continue with statins  Acute kidney injury superimposed on chronic kidney disease (HCC) - Monitor BUN/creatinine closely - Avoid nephrotoxin, hypotension, Lab Results  Component Value Date   CREATININE 2.00 (H) 04/18/2024    CREATININE 1.76 (H) 04/17/2024   CREATININE 1.94 (H) 04/16/2024            Essential hypertension --meds were held for permissive hypertension, now could be continued     Fibromyalgia - Monitoring not on any home meds   Anxiety - Currently stable, monitoring, continue as needed Xanax   Stroke [facility]) - Previous episodes of stroke earlier this year March  - Initially was treated with aspirin  Plavix , statins Per patient she took herself off statin since heart cholesterols were normal Further workup for recurrent stroke findings as above   Carotid stenosis Previous workup:   Carotid ultrasound concerning for left ICA occlusion with left ACA retrograde flow,  -Carotid angiography done - 50% stenosis at the ostia of the CCA. retrograde filling of the ECA, with antegrade filling of the ICA which is greater than 99% stenosis.   - CEA performed on 4/7. - On statins (Crestor  20 mg) and low-dose aspirin  (Patient took herself off statins)     Controlled type 2 diabetes mellitus without complication, without long-term current use of insulin  (HCC) - Checking hemoglobin A1c 7.1 - Resume home regimen, strick diabetic carb modified diet   Smoker - Discussed with patient regarding smoking cessation NicoDerm patch offered   Rheumatoid arthritis (HCC) - Reviewing home medication Continue NSAIDs as needed     Morbid obesity due to excess calories (HCC) Body mass index is 38.58 kg/m. Discussed with patient regarding benefit of weight loss Patient is to follow-up with the PCP regarding weight loss programs and medications   Mixed hyperlipidemia - Continue with high-dose statins-Crestor  40 mg daily - Patient reports that she stopped taking her Crestor  as she noted that her cholesterol level was within normal limits on her last visit with her doctors  Lipid Panel     Component Value Date/Time   CHOL 224 (H) 04/16/2024 1000   CHOL 188 03/05/2016 1127   TRIG 140 04/16/2024 1000    HDL 43 04/16/2024 1000   HDL 35 (L) 03/05/2016 1127   CHOLHDL 5.2 04/16/2024 1000   VLDL 28 04/16/2024 1000   LDLCALC 153 (H) 04/16/2024 1000   LDLCALC 116 (H) 05/27/2017 0831   LABVLDL 27 03/05/2016 1127        ---------------------------------------------------------------------------------------------------------------------- Nutritional status:  The patient's BMI is: Body mass index is 38.43 kg/m. He was counseled regarding diet exercise, weight loss programs      Consultants: Neurology Procedures performed: MRI of the brain, CT chest abdomen pelvis Disposition: Home Diet recommendation:  Discharge Diet Orders (From admission, onward)     Start     Ordered   04/18/24 0000  Diet - low sodium heart healthy        04/18/24 0741           Cardiac and Carb modified diet DISCHARGE MEDICATION: Allergies as of 04/18/2024       Reactions   Lipitor [atorvastatin] Other (See Comments)   Myalgia and joint pain        Medication List     TAKE these medications    aspirin  EC 81 MG tablet Take 1 tablet (81 mg total) by mouth daily for 21 days. Swallow whole.   calcium  carbonate 500 MG chewable tablet Commonly known as: TUMS - dosed in mg elemental calcium  Chew 1 tablet by  mouth as needed for indigestion or heartburn.   clopidogrel  75 MG tablet Commonly known as: PLAVIX  Take 1 tablet (75 mg total) by mouth daily.   empagliflozin  10 MG Tabs tablet Commonly known as: Jardiance  Take 1 tablet (10 mg total) by mouth daily before breakfast.   pantoprazole  40 MG tablet Commonly known as: Protonix  Take 1 tablet (40 mg total) by mouth daily.   rosuvastatin  40 MG tablet Commonly known as: CRESTOR  Take 1 tablet (40 mg total) by mouth daily. What changed:  medication strength how much to take        Discharge Exam: Filed Weights   04/16/24 1655 04/17/24 0503 04/18/24 0500  Weight: 107.5 kg 108 kg 107.4 kg        General:  AAO x 3,  cooperative, no  distress;   HEENT:  Normocephalic, PERRL, otherwise with in Normal limits   Neuro:  CNII-XII intact. , normal motor and sensation, reflexes intact  Subjective bilateral upper extremity weakness and numbness  Lungs:   Clear to auscultation BL, Respirations unlabored,  No wheezes / crackles  Cardio:    S1/S2, RRR, No murmure, No Rubs or Gallops   Abdomen:  Soft, non-tender, bowel sounds active all four quadrants, no guarding or peritoneal signs.  Muscular  skeletal:  Limited exam -global generalized weaknesses - in bed, able to move all 4 extremities,   2+ pulses,  symmetric, No pitting edema  Skin:  Dry, warm to touch, negative for any Rashes,  Wounds: Please see nursing documentation          Condition at discharge: good  The results of significant diagnostics from this hospitalization (including imaging, microbiology, ancillary and laboratory) are listed below for reference.   Imaging Studies: CT CHEST ABDOMEN PELVIS W CONTRAST Result Date: 04/17/2024 CLINICAL DATA:  Occult malignancy. EXAM: CT CHEST, ABDOMEN, AND PELVIS WITH CONTRAST TECHNIQUE: Multidetector CT imaging of the chest, abdomen and pelvis was performed following the standard protocol during bolus administration of intravenous contrast. RADIATION DOSE REDUCTION: This exam was performed according to the departmental dose-optimization program which includes automated exposure control, adjustment of the mA and/or kV according to patient size and/or use of iterative reconstruction technique. CONTRAST:  75mL OMNIPAQUE  IOHEXOL  300 MG/ML  SOLN COMPARISON:  None Available. FINDINGS: CT CHEST FINDINGS Cardiovascular: Atherosclerotic calcification of the aorta with age advanced involvement of the left anterior descending and circumflex coronary arteries. Enlarged pulmonic trunk. Heart is at the upper limits of normal in size. No pericardial effusion. Mediastinum/Nodes: No pathologically enlarged mediastinal, hilar or axillary lymph  nodes. Esophagus is grossly unremarkable. Lungs/Pleura: Lungs are clear. No pleural fluid. Airway is unremarkable. Musculoskeletal: Degenerative changes in the spine. Flowing anterior osteophytosis in the thoracic spine. No worrisome lytic or sclerotic lesions. CT ABDOMEN PELVIS FINDINGS Hepatobiliary: Liver is enlarged, 20.6 cm, and the margin is irregular. Cholecystectomy. No biliary ductal dilatation. Pancreas: Negative. Spleen: Negative. Adrenals/Urinary Tract: Adrenal glands are unremarkable. Renal cortical thinning bilaterally. Tiny right renal stone. Subcentimeter low-attenuation lesions in the kidneys, too small to characterize. Kidneys are otherwise unremarkable. Ureters are decompressed. Bladder is grossly unremarkable. Stomach/Bowel: Stomach, small bowel, appendix and colon are unremarkable. Vascular/Lymphatic: Atherosclerotic calcification of the aorta. No pathologically enlarged lymph nodes. Reproductive: Uterus is visualized.  No adnexal mass. Other: No free fluid.  Mesenteries and peritoneum are unremarkable. Musculoskeletal: Degenerative changes in the spine. IMPRESSION: 1. No evidence of primary or metastatic malignancy. 2.  Age advanced two vessel coronary artery calcification. 3. Cirrhotic enlarged liver. 4. Tiny right  renal stone. 5. Subcentimeter low-attenuation lesions in the kidneys are too small to characterize. If further evaluation is desired, further evaluation with pre and post contrast MRI should be considered. Pre and post contrast CT could alternatively be performed, but would likely be of decreased accuracy given lesion size. 6.  Aortic atherosclerosis (ICD10-I70.0). 7. Enlarged pulmonic trunk, indicative of pulmonary arterial hypertension. Electronically Signed   By: Newell Eke M.D.   On: 04/17/2024 17:58   ECHOCARDIOGRAM COMPLETE Result Date: 04/17/2024    ECHOCARDIOGRAM REPORT   Patient Name:   Erica Graham [facility] Date of Exam: 04/17/2024 Medical Rec #:  [id number]        Height:        66.0 in Accession #:    [id number]       Weight:       238.1 lb Date of Birth:  [birth year]       BSA:          2.154 m Patient Age:    57 years         BP:           150/69 mmHg Patient Gender: F                HR:           76 bpm. Exam Location:  [facility] Procedure: 2D Echo, Cardiac Doppler and Color Doppler (Both Spectral and Color            Flow Doppler were utilized during procedure). Indications:    Stroke l63.9  History:        Patient has no prior history of Echocardiogram examinations.                 Stroke; Risk Factors:Hypertension, Diabetes, Dyslipidemia and                 Current Smoker.  Sonographer:    Aida Pizza RCS Referring Phys: [phone] Neira Bentsen A Levonte Molina IMPRESSIONS  1. Left ventricular ejection fraction, by estimation, is 60 to 65%. The left ventricle has normal function. The left ventricle has no regional wall motion abnormalities. There is mild left ventricular hypertrophy. Left ventricular diastolic parameters are consistent with Grade I diastolic dysfunction (impaired relaxation). Elevated left ventricular end-diastolic pressure.  2. Right ventricular systolic function is normal. The right ventricular size is normal. Tricuspid regurgitation signal is inadequate for assessing PA pressure.  3. The mitral valve is normal in structure. Trivial mitral valve regurgitation. No evidence of mitral stenosis.  4. The aortic valve is tricuspid. There is mild calcification of the aortic valve. Aortic valve regurgitation is not visualized. No aortic stenosis is present.  5. The inferior vena cava is normal in size with greater than 50% respiratory variability, suggesting right atrial pressure of 3 mmHg. Comparison(s): No significant change from prior study. FINDINGS  Left Ventricle: Left ventricular ejection fraction, by estimation, is 60 to 65%. The left ventricle has normal function. The left ventricle has no regional wall motion abnormalities. Strain was performed and the global  longitudinal strain is indeterminate. The left ventricular internal cavity size was normal in size. There is mild left ventricular hypertrophy. Left ventricular diastolic parameters are consistent with Grade I diastolic dysfunction (impaired relaxation). Elevated left ventricular end-diastolic pressure. Right Ventricle: The right ventricular size is normal. No increase in right ventricular wall thickness. Right ventricular systolic function is normal. Tricuspid regurgitation signal is inadequate for assessing PA pressure. Left Atrium: Left atrial size was normal in size.  Right Atrium: Right atrial size was normal in size. Pericardium: There is no evidence of pericardial effusion. Mitral Valve: The mitral valve is normal in structure. Trivial mitral valve regurgitation. No evidence of mitral valve stenosis. Tricuspid Valve: The tricuspid valve is normal in structure. Tricuspid valve regurgitation is not demonstrated. No evidence of tricuspid stenosis. Aortic Valve: The aortic valve is tricuspid. There is mild calcification of the aortic valve. Aortic valve regurgitation is not visualized. No aortic stenosis is present. Pulmonic Valve: The pulmonic valve was not well visualized. Pulmonic valve regurgitation is not visualized. No evidence of pulmonic stenosis. Aorta: The aortic root is normal in size and structure. Venous: The inferior vena cava is normal in size with greater than 50% respiratory variability, suggesting right atrial pressure of 3 mmHg. IAS/Shunts: No atrial level shunt detected by color flow Doppler. Additional Comments: 3D was performed not requiring image post processing on an independent workstation and was indeterminate.  LEFT VENTRICLE PLAX 2D LVIDd:         4.50 cm   Diastology LVIDs:         3.20 cm   LV e' medial:    3.08 cm/s LV PW:         1.20 cm   LV E/e' medial:  27.0 LV IVS:        2.00 cm   LV e' lateral:   5.27 cm/s LVOT diam:     1.80 cm   LV E/e' lateral: 15.8 LV SV:         56 LV SV  Index:   26 LVOT Area:     2.54 cm  RIGHT VENTRICLE RV S prime:     18.20 cm/s TAPSE (M-mode): 2.2 cm LEFT ATRIUM             Index        RIGHT ATRIUM           Index LA diam:        4.30 cm 2.00 cm/m   RA Area:     10.20 cm LA Vol (A2C):   47.1 ml 21.87 ml/m  RA Volume:   16.70 ml  7.75 ml/m LA Vol (A4C):   64.1 ml 29.76 ml/m LA Biplane Vol: 55.8 ml 25.91 ml/m  AORTIC VALVE LVOT Vmax:   101.00 cm/s LVOT Vmean:  65.200 cm/s LVOT VTI:    0.220 m  AORTA Ao Root diam: 3.20 cm MITRAL VALVE MV Area (PHT): 1.70 cm     SHUNTS MV Decel Time: 447 msec     Systemic VTI:  0.22 m MV E velocity: 83.30 cm/s   Systemic Diam: 1.80 cm MV A velocity: 124.00 cm/s MV E/A ratio:  0.67 Vishnu Priya Mallipeddi Electronically signed by Diannah Late Mallipeddi Signature Date/Time: 04/17/2024/4:45:43 PM    Final    MR Angio Head WO CONTRAST Result Date: 04/16/2024 EXAM: MR Angiography Head without intravenous Contrast. 04/16/2024 11:04:10 AM TECHNIQUE: Magnetic resonance angiography images of the head without intravenous contrast. Multiplanar 2D and 3D reformatted images are provided for review. COMPARISON: None provided. CLINICAL HISTORY: Neuro deficit, acute, stroke suspected. FINDINGS: ANTERIOR CIRCULATION: No significant stenosis of the internal carotid arteries. The A1 segment of the left anterior cerebral artery is diminutive. No significant stenosis of the middle cerebral arteries. No aneurysm. POSTERIOR CIRCULATION: No significant stenosis of the posterior cerebral arteries. No significant stenosis of the basilar artery. No significant stenosis of the vertebral arteries. No aneurysm. IMPRESSION: 1. No significant stenosis of the intracranial vasculature.  2. Diminutive A1 segment of the left anterior cerebral artery. Electronically signed by: Evalene Coho MD 04/16/2024 11:43 AM EDT RP Workstation: [id number]   MR Brain Wo Contrast (neuro protocol) Result Date: 04/16/2024 EXAM: MRI BRAIN WITHOUT CONTRAST 04/16/2024  11:04:10 AM TECHNIQUE: Multiplanar multisequence MRI of the head/brain was performed without the administration of intravenous contrast. COMPARISON: MRI of the head dated 11/08/2010. CLINICAL HISTORY: Neuro deficit, acute, stroke suspected. FINDINGS: BRAIN AND VENTRICLES: Multiple foci of restricted diffusion are present within the corpus callosum, primarily involving the body of the corpus callosum on the right and the splenium on the left. Chronic encephalomalacia changes are present medially within the left frontal lobe with blooming artifact in the left caudate nucleus, presumably from hemosiderin staining. Several foci of increased T2 signal are also present in the subcortical white matter bilaterally. Moderate cerebellar volume loss is present. No mass. No midline shift. No hydrocephalus. The sella is unremarkable. Normal flow voids. ORBITS: No acute abnormality. SINUSES AND MASTOIDS: Effusions are present within the mastoid air cells bilaterally. No acute abnormality. BONES AND SOFT TISSUES: Normal marrow signal. No acute soft tissue abnormality. IMPRESSION: 1. Multiple foci of restricted diffusion within the corpus callosum, primarily involving the body on the right and the splenium on the left, compatible with acute non-hemorrhagic infarcts. 2. Chronic encephalomalacia changes medially within the left frontal lobe with blooming artifact in the left caudate nucleus, presumably from hemosiderin staining. 3. Moderate cerebellar volume loss. Electronically signed by: Evalene Coho MD 04/16/2024 11:40 AM EDT RP Workstation: [id number]    Microbiology: Results for orders placed or performed during the hospital encounter of 11/15/23  Surgical pcr screen     Status: None   Collection Time: 11/15/23  6:01 AM   Specimen: Nasal Mucosa; Nasal Swab  Result Value Ref Range Status   MRSA, PCR NEGATIVE NEGATIVE Final   Staphylococcus aureus NEGATIVE NEGATIVE Final    Comment: (NOTE) The Xpert SA Assay (FDA  approved for NASAL specimens in patients 61 years of age and older), is one component of a comprehensive surveillance program. It is not intended to diagnose infection nor to guide or monitor treatment. Performed at [facility], [address], [place], [place] [address]     Labs: CBC: Recent Labs  Lab 04/16/24 1000 04/17/24 0457 04/18/24 0506  WBC 6.8 5.5 5.5  NEUTROABS 5.8  --   --   HGB 16.9* 15.3* 14.9  HCT 50.4* 46.3* 46.0  MCV 89.0 89.9 91.6  PLT 167 156 152   Basic Metabolic Panel: Recent Labs  Lab 04/16/24 1000 04/17/24 0457 04/18/24 0506  NA 134* 137 137  K 4.4 4.3 4.1  CL 107 112* 111  CO2 17* 19* 20*  GLUCOSE 201* 145* 150*  BUN 49* 37* 38*  CREATININE 1.94* 1.76* 2.00*  CALCIUM  9.1 8.6* 8.5*  MG 2.3  --   --   PHOS 3.9  --   --    Liver Function Tests: Recent Labs  Lab 04/16/24 1000  AST 29  ALT 43  ALKPHOS 106  BILITOT 0.9  PROT 8.0  ALBUMIN  4.0   CBG: Recent Labs  Lab 04/17/24 0733 04/17/24 1137 04/17/24 1619 04/17/24 2039 04/18/24 0731  GLUCAP 181* 139* 127* 166* 150*    Discharge time spent: greater than 30 minutes.  Signed: Adriana DELENA Grams, MD [facility] 04/18/2024

## 2024-04-25 ENCOUNTER — Ambulatory Visit: Attending: Internal Medicine

## 2024-05-04 ENCOUNTER — Ambulatory Visit: Payer: Self-pay

## 2024-05-17 ENCOUNTER — Telehealth: Payer: Self-pay

## 2024-05-17 NOTE — Progress Notes (Unsigned)
 Complex Care Management Note  Care Guide Note 05/17/2024 Name: Erica Graham MRN: [id number] DOB: November 27, 1965  Erica Graham is a 58 y.o. year old female who sees Bevely Doffing, FNP for primary care. I reached out to Erica Graham by phone today to offer complex care management services.  Erica Graham was given information about Complex Care Management services today including:   The Complex Care Management services include support from the care team which includes your Nurse Care Manager, Clinical Social Worker, or Pharmacist.  The Complex Care Management team is here to help remove barriers to the health concerns and goals most important to you. Complex Care Management services are voluntary, and the patient may decline or stop services at any time by request to their care team member.   Complex Care Management Consent Status: Patient agreed to services and verbal consent obtained.   Follow up plan:  Unsuccessful telephone outreach attempt made. A HIPAA compliant phone message was left for the patient providing contact information and requesting a return call.  Encounter Outcome:  No Answer-Pt said she would call back.

## 2024-05-18 ENCOUNTER — Ambulatory Visit: Payer: Self-pay

## 2024-05-18 VITALS — BP 148/84 | HR 76 | Ht 66.0 in | Wt 228.0 lb

## 2024-05-18 DIAGNOSIS — I639 Cerebral infarction, unspecified: Secondary | ICD-10-CM

## 2024-05-18 DIAGNOSIS — I6930 Unspecified sequelae of cerebral infarction: Secondary | ICD-10-CM | POA: Diagnosis not present

## 2024-05-18 DIAGNOSIS — K219 Gastro-esophageal reflux disease without esophagitis: Secondary | ICD-10-CM | POA: Diagnosis not present

## 2024-05-18 DIAGNOSIS — M51362 Other intervertebral disc degeneration, lumbar region with discogenic back pain and lower extremity pain: Secondary | ICD-10-CM

## 2024-05-18 DIAGNOSIS — Z7984 Long term (current) use of oral hypoglycemic drugs: Secondary | ICD-10-CM

## 2024-05-18 DIAGNOSIS — Z1231 Encounter for screening mammogram for malignant neoplasm of breast: Secondary | ICD-10-CM

## 2024-05-18 DIAGNOSIS — E119 Type 2 diabetes mellitus without complications: Secondary | ICD-10-CM | POA: Diagnosis not present

## 2024-05-18 MED ORDER — METHOCARBAMOL 750 MG PO TABS: 750.0000 mg | ORAL_TABLET | Freq: Three times a day (TID) | ORAL | 5 refills | Status: AC | PRN

## 2024-05-18 MED ORDER — PANTOPRAZOLE SODIUM 40 MG PO TBEC: 40.0000 mg | DELAYED_RELEASE_TABLET | Freq: Every day | ORAL | 5 refills | Status: AC

## 2024-05-18 NOTE — Progress Notes (Signed)
 Established Patient Office Visit  Subjective   Patient ID: Erica Graham, female    DOB: 12-11-1965  Age: 58 y.o. MRN: [id number]  Chief Complaint  Patient presents with   Medical Management of Chronic Issues    3 month follow up    HPI Discussed the use of AI scribe software for clinical note transcription with the patient, who gave verbal consent to proceed.  History of Present Illness   Erica Graham is a 58 year old female with a history of strokes who presents for medication management and sciatic nerve pain.  Cerebrovascular events - Two additional strokes since last visit - Uncertain about which medications require refilling - Blood pressure medication was held during recent hospital stay - Currently taking amlodipine  daily with two refills remaining - Also taking aspirin , Plavix , and Jardiance   Sciatic nerve pain - Debilitating pain attributed to a fall during strokes - Pain is most severe in the mornings - Requires frequent use of ice packs throughout the day, including at work - Tylenol  provides partial relief - Avoids ibuprofen and Aleve due to concerns about bruising  Constipation - Persistent constipation since first stroke - Currently taking vitamins and magnesium  citrate - Seeking recommendations for optimal treatment  Renal function concerns - Hospital noted an unspecified issue with kidneys - Uncertain about specific details - Drinks water  regularly and occasionally unsweetened tea with lemon      Patient Active Problem List   Diagnosis Date Noted   Gastroesophageal reflux disease without esophagitis 05/23/2024   Stroke (HCC) 04/16/2024   Carotid stenosis 11/15/2023   Symptomatic carotid artery stenosis, left 11/15/2023   Essential hypertension 11/08/2023   Controlled type 2 diabetes mellitus without complication, without long-term current use of insulin  (HCC) 11/08/2023   Acute kidney injury superimposed on chronic kidney disease 11/08/2023    Acute CVA (cerebrovascular accident) (HCC) 11/07/2023   Abdominal aortic atherosclerosis 06/03/2017   Primary osteoarthritis of both hands 04/08/2017   Primary osteoarthritis of both feet 04/08/2017   Primary osteoarthritis of both knees 04/08/2017   scoliosis, lumbar region 04/08/2017   DDD (degenerative disc disease), lumbar 04/08/2017   Positive ANA (antinuclear antibody) 04/08/2017   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   Smoker 03/04/2017   Rheumatoid arthritis (HCC) 12/03/2016   Vitamin D  deficiency 10/15/2016   Tubular adenoma of colon 08/21/2016   Fibromyalgia 05/28/2016   Type 2 diabetes, HbA1c goal < 7% (HCC) 04/30/2016   Mixed hyperlipidemia 04/30/2016   Morbid obesity due to excess calories (HCC) 04/30/2016   Anxiety 03/05/2016   Varicose vein of leg 03/05/2016      ROS    Objective:     BP (!) 148/84   Pulse 76   Ht 5' 6 (1.676 m)   Wt 228 lb 0.6 oz (103.4 kg)   SpO2 98%   BMI 36.81 kg/m  BP Readings from Last 3 Encounters:  05/18/24 (!) 148/84  04/18/24 (!) 147/65  01/20/24 (!) 159/92   Wt Readings from Last 3 Encounters:  05/18/24 228 lb 0.6 oz (103.4 kg)  04/18/24 236 lb 12.4 oz (107.4 kg)  01/20/24 238 lb 0.6 oz (108 kg)      Physical Exam Vitals and nursing note reviewed.  Constitutional:      Appearance: Normal appearance.  HENT:     Head: Normocephalic.  Eyes:     Extraocular Movements: Extraocular movements intact.     Pupils: Pupils are equal, round, and reactive to light.  Cardiovascular:  Rate and Rhythm: Normal rate and regular rhythm.  Pulmonary:     Effort: Pulmonary effort is normal.     Breath sounds: Normal breath sounds.  Musculoskeletal:     Cervical back: Normal range of motion and neck supple.  Neurological:     Mental Status: She is alert and oriented to person, place, and time.  Psychiatric:        Mood and Affect: Mood normal.        Thought Content: Thought content normal.        Last CBC Lab Results   Component Value Date   WBC 5.5 04/18/2024   HGB 14.9 04/18/2024   HCT 46.0 04/18/2024   MCV 91.6 04/18/2024   MCH 29.7 04/18/2024   RDW 14.3 04/18/2024   PLT 152 04/18/2024   Last metabolic panel Lab Results  Component Value Date   GLUCOSE 130 (H) 05/18/2024   NA 140 05/18/2024   K 5.1 05/18/2024   CL 103 05/18/2024   CO2 17 (L) 05/18/2024   BUN 38 (H) 05/18/2024   CREATININE 2.55 (H) 05/18/2024   GFRNONAA 28 (L) 04/18/2024   CALCIUM  9.8 05/18/2024   PHOS 3.9 04/16/2024   PROT 8.0 04/16/2024   ALBUMIN  4.0 04/16/2024   LABGLOB 2.9 01/20/2024   AGRATIO 1.6 04/15/2017   BILITOT 0.9 04/16/2024   ALKPHOS 106 04/16/2024   AST 29 04/16/2024   ALT 43 04/16/2024   ANIONGAP 6 04/18/2024   Last lipids Lab Results  Component Value Date   CHOL 224 (H) 04/16/2024   HDL 43 04/16/2024   LDLCALC 153 (H) 04/16/2024   TRIG 140 04/16/2024   CHOLHDL 5.2 04/16/2024   Last hemoglobin A1c Lab Results  Component Value Date   HGBA1C 7.1 (H) 04/16/2024   Last thyroid  functions Lab Results  Component Value Date   TSH 3.173 04/16/2024   Last vitamin D  Lab Results  Component Value Date   VD25OH 36 05/27/2017   Last vitamin B12 and Folate No results found for: VITAMINB12, FOLATE    The ASCVD Risk score (Arnett DK, et al., 2019) failed to calculate for the following reasons:   Risk score cannot be calculated because patient has a medical history suggesting prior/existing ASCVD    Assessment & Plan:   Problem List Items Addressed This Visit       Cardiovascular and Mediastinum   Stroke [facility])   Two additional strokes since last visit. She expressed fear of recurrence. - Previous episodes of stroke earlier this year March and April - Restart blood pressure medication at a lower dose. - Refer to [facility] for follow-up. -Patient has been compliant with aspirin  and statins       Relevant Medications   aspirin  EC 81 MG tablet   amLODipine   (NORVASC ) 10 MG tablet     Digestive   Gastroesophageal reflux disease without esophagitis   Stable with Protonix  40 mg.       Relevant Medications   pantoprazole  (PROTONIX ) 40 MG tablet     Endocrine   Type 2 diabetes, HbA1c goal < 7% (HCC) - Primary   She is currently only on Jardiance .  Will recheck labs today.  Follow-up in 3 months or sooner if needed. A1c is 7.1. Avoided steroid pack to prevent blood sugar increase.      Relevant Medications   aspirin  EC 81 MG tablet   Other Relevant Orders   Basic Metabolic Panel (BMET) (Completed)   Urine Microalbumin w/creat. ratio (Completed)  Musculoskeletal and Integument   DDD (degenerative disc disease), lumbar   Debilitating sciatic pain likely from fall during stroke. Managed with ice and Tylenol . Muscle relaxer preferred to avoid blood sugar increase. - Prescribe a non-drowsy muscle relaxer. - Provide exercises to perform at home to reduce inflammation.      Relevant Medications   methocarbamol (ROBAXIN) 750 MG tablet   Other Visit Diagnoses       Encounter for screening mammogram for malignant neoplasm of breast       Relevant Orders   MM Digital Screening        No follow-ups on file.    Leita Longs, FNP

## 2024-05-20 LAB — BASIC METABOLIC PANEL WITH GFR
BUN/Creatinine Ratio: 15 (ref 9–23)
BUN: 38 mg/dL — ABNORMAL HIGH (ref 6–24)
CO2: 17 mmol/L — ABNORMAL LOW (ref 20–29)
Calcium: 9.8 mg/dL (ref 8.7–10.2)
Chloride: 103 mmol/L (ref 96–106)
Creatinine, Ser: 2.55 mg/dL — ABNORMAL HIGH (ref 0.57–1.00)
Glucose: 130 mg/dL — ABNORMAL HIGH (ref 70–99)
Potassium: 5.1 mmol/L (ref 3.5–5.2)
Sodium: 140 mmol/L (ref 134–144)
eGFR: 21 mL/min/1.73 — ABNORMAL LOW (ref >59)

## 2024-05-20 LAB — MICROALBUMIN / CREATININE URINE RATIO
Creatinine, Urine: 67.1 mg/dL
Microalb/Creat Ratio: 956 mg/g{creat} — ABNORMAL HIGH (ref 0–29)
Microalbumin, Urine: 641.8 ug/mL

## 2024-05-22 ENCOUNTER — Telehealth: Payer: Self-pay | Admitting: Adult Health

## 2024-05-22 NOTE — Telephone Encounter (Signed)
 Patient said fell on 04/16/24, after the fall had numbness and just did not feel right and went to the ER. Test showed had two spots on the brain. Was diagnosed have had a stroke. Was admitted 04/16/24 and discharged that Tuesday, 04/18/24. Would like to find know why keep having strokes.

## 2024-05-23 DIAGNOSIS — K219 Gastro-esophageal reflux disease without esophagitis: Secondary | ICD-10-CM | POA: Insufficient documentation

## 2024-05-23 NOTE — Assessment & Plan Note (Signed)
 Stable with Protonix  40 mg.

## 2024-05-23 NOTE — Assessment & Plan Note (Signed)
 She is currently only on Jardiance .  Will recheck labs today.  Follow-up in 3 months or sooner if needed. A1c is 7.1. Avoided steroid pack to prevent blood sugar increase.

## 2024-05-23 NOTE — Assessment & Plan Note (Signed)
 Two additional strokes since last visit. She expressed fear of recurrence. - Previous episodes of stroke earlier this year March and April - Restart blood pressure medication at a lower dose. - Refer to [facility] for follow-up. -Patient has been compliant with aspirin  and statins

## 2024-05-23 NOTE — Assessment & Plan Note (Signed)
 Debilitating sciatic pain likely from fall during stroke. Managed with ice and Tylenol . Muscle relaxer preferred to avoid blood sugar increase. - Prescribe a non-drowsy muscle relaxer. - Provide exercises to perform at home to reduce inflammation.

## 2024-05-25 NOTE — Telephone Encounter (Signed)
 Spoke with Harlene NP. Please call patient and offer sooner appt w/ Jessica on 06/06/24 at 10:15 AM check-in at 10:00. If pt cannot come that day, please offer other sooner hosp f/u slot on Jessica's schedule. Please leave the December visit with Dr Rosemarie in case this appt is needed.

## 2024-05-25 NOTE — Telephone Encounter (Signed)
 thanks

## 2024-05-30 ENCOUNTER — Ambulatory Visit

## 2024-06-14 NOTE — Progress Notes (Deleted)
 [facility] [address] [place]. [place] [address] [phone]       HOSPITAL FOLLOW UP NOTE  Ms. Erica Graham Date of Birth:  1966-06-08 Medical Record Number:  [id number]   Reason for Referral:  hospital stroke follow up    SUBJECTIVE:   CHIEF COMPLAINT:  No chief complaint on file.   HPI:   Update 06/15/2024 JM: Patient returns for hospital follow-up.  Previously seen back in May for initial hospital show follow-up and was discharged is overall stable.  She presented to ED on 9/7 with bilateral upper extremity numbness and weakness and vague lower extremity weakness that started the evening prior.  MRI showed acute nonhemorrhagic infarcts within the corpus callosum, primarily involving the body on the right and splenium of the left. MRA head negative LVO, CUS bilateral carotid arthrosclerosis, no significant ICA stenosis, degree of narrowing less than 50% bilaterally.  2D echo without concerning findings.  LDL 153.  A1c 7.1.  Recommended DAPT for 3 weeks and Plavix  alone and restart of Crestor  (previously self discontinued statin once cholesterol levels were normal).  Noted current tobacco use with smoking cessation counseling provided.  Recommended 30-day cardiac monitor to evaluate for A-fib.          Initial visit 12/16/2023 JM: Patient is being seen for initial hospital follow-up unaccompanied.  Reports overall doing well since discharge.  Denies residual deficits and has since returned back to all prior activities.  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 Denies new stroke/TIA symptoms. Denies fatigue prior to stroke, denies insomnia or nocturia, no snoring or witnessed apneas, no morning headaches.  Underwent L CEA 4/7 without complication and remained on DAPT and statin.  Had follow-up with [facility] last week, cleared to d/c plavix  from vascular  standpoint but advised to discuss ongoing need of DAPT today.  Surgical incision healing well.  Plans on repeat carotid ultrasound in 9 months.  She has remained on aspirin , Plavix  and Crestor  without side effects.  Does report fluctuation of glucose levels and blood pressure, no clear cause for fluctuation.  She is scheduled to establish care with PCP in July.  She reports complete tobacco cessation since discharge     Stroke admission 11/07/2023: Ms. Erica Graham is a 58 y.o. female with history of prior stroke, depression, diabetes, GERD, hyperlipidemia, hypertension, obesity, daily smoker who presented to ED on 11/07/2023 with aphasia and mild right-sided weakness.  Stroke workup revealed left frontal and BG infarct likely secondary to large vessel disease from left ICA bulb and siphon severe stenosis (see imaging below).  Evaluated by vascular surgery who recommended CEA outpatient.  Placed on DAPT and Crestor  20 mg daily.  LDL 100, A1c 9.1.  noted new dx of DM, started on Jardiance . Current tobacco use with smoking cessation counseling provided.  No therapy needs.  Scheduled for for CEA 4/7.     PERTINENT IMAGING  Code Stroke CT head Multiple foci of hypoattenuation and left frontal lobe basal ganglia    CTA head & neck Critically stenotic and nearly occlusive left ICA origin with only a tiny stream of contrast visible. More distal ICA is small, but remains patent. Severe bilateral paraclinoid ICA stenosis. Severe right vertebral artery origin stenosis. Extensive  atherosclerosis of the left common carotid artery with multifocal moderate to severe stenosis. MRI  Multifocal acute infarcts within the left frontal  lobe and left basal ganglia, predominantly ACA territory. No hemorrhage. 2D Echo EF 55 to 60%, no PFO Carotid US  concerning for left ICA occlusion left ACA retrograde flow Carotid angiography - 50% stenosis at the ostia of the CCA. retrograde filling of the ECA, with antegrade  filling of the ICA which is greater than 99% stenosis.  Widely patent distally. LDL 100 HgbA1c 9.1 UDS negative    ROS:   14 system review of systems performed and negative with exception of those listed in HPI  PMH:  Past Medical History:  Diagnosis Date   Acute CVA (cerebrovascular accident) (HCC) 11/07/2023   Anxiety 03/05/2016   Arthritis    rheumatoid   Asthma    Body aches 03/05/2016   Depression    Emphysema of lung (HCC)    Fibromyalgia    GERD (gastroesophageal reflux disease)    Hyperlipidemia    Hypertension    Obesity    Recent onset of diabetes mellitus (HCC) 03/10/2016   Referred to Dr Lenis and rx metformin  500 mg bid and zocor  20 mg 1 daily   Varicose vein of leg 03/05/2016   Vitiligo 03/05/2016    PSH:  Past Surgical History:  Procedure Laterality Date   CAROTID ANGIOGRAPHY N/A 11/10/2023   Procedure: CAROTID ANGIOGRAPHY;  Surgeon: Lanis Fonda BRAVO, MD;  Location: [facility] INVASIVE CV LAB;  Service: Cardiovascular;  Laterality: N/A;   CHOLECYSTECTOMY     COLONOSCOPY N/A 08/20/2016   Procedure: COLONOSCOPY;  Surgeon: Claudis RAYMOND Rivet, MD;  Location: [facility] ENDO SUITE;  Service: Endoscopy;  Laterality: N/A;  830    DILATION AND CURETTAGE OF UTERUS     ENDARTERECTOMY Left 11/15/2023   Procedure: ENDARTERECTOMY, CAROTID;  Surgeon: Gretta Lonni PARAS, MD;  Location: [facility] OR;  Service: Vascular;  Laterality: Left;   GALLBLADDER SURGERY     LEG SURGERY Left    broke leg   PATCH ANGIOPLASTY  11/15/2023   Procedure: ANGIOPLASTY, USING PATCH GRAFT;  Surgeon: Gretta Lonni PARAS, MD;  Location: [facility] OR;  Service: Vascular;;   POLYPECTOMY  08/20/2016   Procedure: POLYPECTOMY;  Surgeon: Claudis RAYMOND Rivet, MD;  Location: [facility] SUITE;  Service: Endoscopy;;  colon   TUBAL LIGATION      Social History:  Social History   Socioeconomic History   Marital status: Divorced    Spouse name: Not on file   Number of children: 2   Years of education: 13   Highest education level: Not  on file  Occupational History   Occupation: hairdresser    Comment: self  Tobacco Use   Smoking status: Former    Current packs/day: 0.00    Average packs/day: 1 pack/day for 20.0 years (20.0 ttl pk-yrs)    Types: Cigarettes    Quit date: 11/06/2023    Years since quitting: 0.6   Smokeless tobacco: Never  Vaping Use   Vaping status: Never Used  Substance and Sexual Activity   Alcohol  use: Yes    Comment: occ 1 monthly    Drug use: No   Sexual activity: Not Currently    Birth control/protection: Surgical    Comment: tubal  Other Topics Concern   Not on file  Social History Narrative   Lives at home with two children, son is 21, daughter is 54   Social Drivers of Corporate Investment Banker Strain: Not on file  Food Insecurity: No Food Insecurity (04/16/2024)   Hunger Vital Sign    Worried About Running Out of Food  in the Last Year: Never true    Ran Out of Food in the Last Year: Never true  Transportation Needs: No Transportation Needs (04/16/2024)   PRAPARE - Administrator, Civil Service (Medical): No    Lack of Transportation (Non-Medical): No  Physical Activity: Not on file  Stress: Not on file  Social Connections: Not on file  Intimate Partner Violence: Not At Risk (04/16/2024)   Humiliation, Afraid, Rape, and Kick questionnaire    Fear of Current or Ex-Partner: No    Emotionally Abused: No    Physically Abused: No    Sexually Abused: No    Family History:  Family History  Problem Relation Age of Onset   Cancer Paternal Grandfather        prostate   Diabetes Paternal Grandmother    Emphysema Maternal Grandmother    Cancer Maternal Grandmother        lung   Alcohol  abuse Maternal Grandfather    Diabetes Father    Hypertension Mother    Diabetes Mother    Crohn's disease Sister     Medications:   Current Outpatient Medications on File Prior to Visit  Medication Sig Dispense Refill   amLODipine  (NORVASC ) 10 MG tablet Take 10 mg by mouth daily.      aspirin  EC 81 MG tablet Take 81 mg by mouth daily. Swallow whole.     clopidogrel  (PLAVIX ) 75 MG tablet Take 1 tablet (75 mg total) by mouth daily. 30 tablet 2   empagliflozin  (JARDIANCE ) 10 MG TABS tablet Take 1 tablet (10 mg total) by mouth daily before breakfast. 30 tablet 5   methocarbamol (ROBAXIN) 750 MG tablet Take 1 tablet (750 mg total) by mouth every 8 (eight) hours as needed for muscle spasms. 60 tablet 5   pantoprazole  (PROTONIX ) 40 MG tablet Take 1 tablet (40 mg total) by mouth daily. 30 tablet 5   rosuvastatin  (CRESTOR ) 40 MG tablet Take 1 tablet (40 mg total) by mouth daily. 90 tablet 2   No current facility-administered medications on file prior to visit.    Allergies:   Allergies  Allergen Reactions   Lipitor [Atorvastatin] Other (See Comments)    Myalgia and joint pain      OBJECTIVE:  Physical Exam  There were no vitals filed for this visit.  There is no height or weight on file to calculate BMI. No results found.  General: well developed, well nourished, very pleasant middle-aged Caucasian female, seated, in no evident distress Head: head normocephalic and atraumatic.   Neck: supple with no carotid or supraclavicular bruits Cardiovascular: regular rate and rhythm, no murmurs Musculoskeletal: no deformity Skin:  L CEA incision healing well Vascular:  Normal pulses all extremities   Neurologic Exam Mental Status: Awake and fully alert.  Fluent speech and language.  Oriented to place and time. Recent and remote memory intact. Attention span, concentration and fund of knowledge appropriate. Mood and affect appropriate.  Cranial Nerves: Pupils equal, briskly reactive to light. Extraocular movements full without nystagmus. Visual fields full to confrontation. Hearing intact. Facial sensation intact. Face, tongue, palate moves normally and symmetrically.  Motor: Normal bulk and tone. Normal strength in all tested extremity muscles Sensory.: intact to touch ,  pinprick , position and vibratory sensation.  Coordination: Rapid alternating movements normal in all extremities. Finger-to-nose and heel-to-shin performed accurately bilaterally. Gait and Station: Arises from chair without difficulty. Stance is normal. Gait demonstrates normal stride length and balance without use of AD.  Reflexes: 1+ and symmetric. Toes downgoing.     NIHSS  0 Modified Rankin  0      ASSESSMENT: Erica Graham is a 58 y.o. year old female with recent bilateral corpus callosum infarcts on 04/16/2024 suspected cardioembolic secondary to unclear source.  History of left frontal and BG infarct on 11/07/2023 likely due to large vessel disease from severe left ICA bulb and siphon stenosis. Vascular risk factors include carotid artery stenosis, HTN, HLD, uncontrolled DM, tobacco use and obesity.      PLAN:  Cryptogenic stroke Hx of Left frontal and BG stroke:  Recovered well without residual deficit.  Has since returned back to all prior activities. C/o fatigue, likely post stroke, should continue to gradually improve over the next couple of months. If persists, would advise to discuss further with PCP Continue Plavix  and rosuvastatin  (Crestor ) 40 mg daily for secondary stroke prevention managed/prescribed by PCP.  Discussed importance of lifelong use of these medications unless contraindicated in the future Further/prior work up: 2D bubble 10/2023 without evidence of PFO 30-day cardiac event monitor completed ***, awaiting results Will complete hypercoagulable labs as relatively young and has had multiple strokes Sleep study *** Discussed secondary stroke prevention measures and importance of establishing care with PCP as scheduled in July for routine follow up for aggressive stroke risk factor management including BP goal<130/90, HLD with LDL goal<70 and DM with A1c.<7 .  Stroke labs 04/2024: LDL 153, A1c 7.1 I have gone over the pathophysiology of stroke, warning signs  and symptoms, risk factors and their management in some detail with instructions to go to the closest emergency room for symptoms of concern.  Carotid stenosis: Continues to be followed by vascular surgery S/p L CEA 11/15/2023 without complication Carotid duplex 04/2024 without significant stenosis bilaterally  Tobacco use: Reports complete cessation since discharge and was congratulated on this achievement!  Discussed importance of continued tobacco cessation    No further recommendations from stroke standpoint and will be establishing care with PCP in 2 months.  She can follow-up on an as-needed basis at this time but advised to call with any questions or concerns in the future   CC:  [facility] provider: Dr. Rosemarie PCP: Bevely Doffing, FNP    I spent 54 minutes of face-to-face and non-face-to-face time with patient.  This included previsit chart review including extensive review of hospitalization, lab review, study review, order entry, electronic health record documentation, patient education and discussion regarding above diagnoses and treatment plan and answered all other questions to patient's satisfaction  Harlene Bogaert, [facility]  [facility] [address] [place], [place] [address]  Phone [phone] Fax [phone] Note: This document was prepared with digital dictation and possible smart phrase technology. Any transcriptional errors that result from this process are unintentional.

## 2024-06-15 ENCOUNTER — Ambulatory Visit: Admitting: Adult Health

## 2024-06-21 NOTE — Progress Notes (Signed)
 Erica Graham [address] [place].  [address] [phone]       HOSPITAL FOLLOW UP NOTE  Erica Graham Date of Birth:  [birth year] Medical Record Number:  [id number]   Reason for visit:  hospital stroke follow up    SUBJECTIVE:   CHIEF COMPLAINT:  Chief Complaint  Patient presents with   Follow-up    Pt in room 8. Alone. Here for stroke follow up.    HPI:   Update 06/22/2024 JM: Patient returns for hospital follow-up.  Previously seen back in May for initial hospital show follow-up and was discharged is overall stable.  She presented to ED on 9/7 with bilateral upper extremity numbness and weakness and vague lower extremity weakness that started the evening prior.  MRI showed acute nonhemorrhagic infarcts within the corpus callosum, primarily involving the body on the right and splenium of the left. MRA head negative LVO, CUS bilateral carotid arthrosclerosis, no significant ICA stenosis, degree of narrowing less than 50% bilaterally.  2D echo without concerning findings.  LDL 153.  A1c 7.1.  Recommended DAPT for 3 weeks and Plavix  alone and restart of Crestor  (previously self discontinued statin once cholesterol levels were normal).  Recommended 30-day cardiac monitor to evaluate for A-fib.  Currently, she reports continued numbness (odd sensation) from her head down to her toes bilaterally. No significant improvement since discharge. She also notes persistent low back pain with right leg sciatica, reports a fall into her hot water  tank prior to ED presentation and symptoms have been persistent since that time. Has improved some but still greatly bothersome and limiting. PCP rx'd Robaxin but no significant benefit.  No new stroke/TIA symptoms.  Reports completion of 30-day cardiac monitor on 10/12 but never received results.  She remains on both aspirin  and Plavix , she does bruise easily otherwise tolerating okay.  Reports generalized muscle pains on  Crestor , she is taking co-Q10, she questions alternative therapy.  Blood pressure well-controlled.  Monitors glucose levels every morning which have been stable.  Continue tobacco cessation since her prior stroke.  Reports sleep study years ago (at least over 5 years ago) but never received results. She primarily lays on her stomach but has been told she snores if she sleeps on her back. Has been having issues sleeping but attributes this to low back pain. Does have some daytime fatigue.       History provided for reference purposes only Initial visit 12/16/2023 JM: Patient is being seen for initial hospital follow-up unaccompanied.  Reports overall doing well since discharge.  Denies residual deficits and has since returned back to all prior activities.  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 Denies new stroke/TIA symptoms. Denies fatigue prior to stroke, denies insomnia or nocturia, no snoring or witnessed apneas, no morning headaches.  Underwent L CEA 4/7 without complication and remained on DAPT and statin.  Had follow-up with [facility] last week, cleared to d/c plavix  from vascular standpoint but advised to discuss ongoing need of DAPT today.  Surgical incision healing well.  Plans on repeat carotid ultrasound in 9 months.  She has remained on aspirin , Plavix  and Crestor  without side effects.  Does report fluctuation of glucose levels and blood pressure, no clear cause for fluctuation.  She is scheduled to establish care with PCP in July.  She reports complete tobacco cessation since discharge   Stroke admission  11/07/2023: Erica Graham is a 58 y.o. female with history of prior stroke, depression, diabetes, GERD, hyperlipidemia, hypertension, obesity, daily smoker who presented to ED on 11/07/2023 with aphasia and mild right-sided weakness.  Stroke workup revealed left frontal and BG infarct  likely secondary to large vessel disease from left ICA bulb and siphon severe stenosis (see imaging below).  Evaluated by vascular surgery who recommended CEA outpatient.  Placed on DAPT and Crestor  20 mg daily.  LDL 100, A1c 9.1.  noted new dx of DM, started on Jardiance . Current tobacco use with smoking cessation counseling provided.  No therapy needs.  Scheduled for for CEA 4/7.     PERTINENT IMAGING  Code Stroke CT head Multiple foci of hypoattenuation and left frontal lobe basal ganglia    CTA head & neck Critically stenotic and nearly occlusive left ICA origin with only a tiny stream of contrast visible. More distal ICA is small, but remains patent. Severe bilateral paraclinoid ICA stenosis. Severe right vertebral artery origin stenosis. Extensive  atherosclerosis of the left common carotid artery with multifocal moderate to severe stenosis. MRI  Multifocal acute infarcts within the left frontal lobe and left basal ganglia, predominantly ACA territory. No hemorrhage. 2D Echo EF 55 to 60%, no PFO Carotid US  concerning for left ICA occlusion left ACA retrograde flow Carotid angiography - 50% stenosis at the ostia of the CCA. retrograde filling of the ECA, with antegrade filling of the ICA which is greater than 99% stenosis.  Widely patent distally. LDL 100 HgbA1c 9.1 UDS negative    ROS:   14 system review of systems performed and negative with exception of those listed in HPI  PMH:  Past Medical History:  Diagnosis Date   Acute CVA (cerebrovascular accident) (HCC) 11/07/2023   Anxiety 03/05/2016   Arthritis    rheumatoid   Asthma    Body aches 03/05/2016   Depression    Emphysema of lung (HCC)    Fibromyalgia    GERD (gastroesophageal reflux disease)    Hyperlipidemia    Hypertension    Obesity    Recent onset of diabetes mellitus (HCC) 03/10/2016   Referred to Dr Lenis and rx metformin  500 mg bid and zocor  20 mg 1 daily   Varicose vein of leg 03/05/2016   Vitiligo  03/05/2016    PSH:  Past Surgical History:  Procedure Laterality Date   CAROTID ANGIOGRAPHY N/A 11/10/2023   Procedure: CAROTID ANGIOGRAPHY;  Surgeon: Lanis Fonda BRAVO, MD;  Location: [facility] INVASIVE CV LAB;  Service: Cardiovascular;  Laterality: N/A;   CHOLECYSTECTOMY     COLONOSCOPY N/A 08/20/2016   Procedure: COLONOSCOPY;  Surgeon: Claudis RAYMOND Rivet, MD;  Location: [facility];  Service: Endoscopy;  Laterality: N/A;  830    DILATION AND CURETTAGE OF UTERUS     ENDARTERECTOMY Left 11/15/2023   Procedure: ENDARTERECTOMY, CAROTID;  Surgeon: Gretta Lonni PARAS, MD;  Location: [facility] OR;  Service: Vascular;  Laterality: Left;   GALLBLADDER SURGERY     LEG SURGERY Left    broke leg   PATCH ANGIOPLASTY  11/15/2023   Procedure: ANGIOPLASTY, USING PATCH GRAFT;  Surgeon: Gretta Lonni PARAS, MD;  Location: [facility] OR;  Service: Vascular;;   POLYPECTOMY  08/20/2016   Procedure: POLYPECTOMY;  Surgeon: Claudis RAYMOND Rivet, MD;  Location: [facility];  Service: Endoscopy;;  colon   TUBAL LIGATION      Social History:  Social History   Socioeconomic History   Marital status: Divorced  Spouse name: Not on file   Number of children: 2   Years of education: 58   Highest education level: Not on file  Occupational History   Occupation: hairdresser    Comment: self  Tobacco Use   Smoking status: Former    Current packs/day: 0.00    Average packs/day: 1 pack/day for 20.0 years (20.0 ttl pk-yrs)    Types: Cigarettes    Quit date: 11/06/2023    Years since quitting: 0.6   Smokeless tobacco: Never  Vaping Use   Vaping status: Never Used  Substance and Sexual Activity   Alcohol  use: Yes    Comment: occ 1 monthly    Drug use: No   Sexual activity: Not Currently    Birth control/protection: Surgical    Comment: tubal  Other Topics Concern   Not on file  Social History Narrative   Lives at home with two children, son is 33, daughter is 78   Social Drivers of Corporate Investment Banker Strain: Not on  file  Food Insecurity: No Food Insecurity (04/16/2024)   Hunger Vital Sign    Worried About Running Out of Food in the Last Year: Never true    Ran Out of Food in the Last Year: Never true  Transportation Needs: No Transportation Needs (04/16/2024)   PRAPARE - Administrator, Civil Service (Medical): No    Lack of Transportation (Non-Medical): No  Physical Activity: Not on file  Stress: Not on file  Social Connections: Not on file  Intimate Partner Violence: Not At Risk (04/16/2024)   Humiliation, Afraid, Rape, and Kick questionnaire    Fear of Current or Ex-Partner: No    Emotionally Abused: No    Physically Abused: No    Sexually Abused: No    Family History:  Family History  Problem Relation Age of Onset   Cancer Paternal Grandfather        prostate   Diabetes Paternal Grandmother    Emphysema Maternal Grandmother    Cancer Maternal Grandmother        lung   Alcohol  abuse Maternal Grandfather    Diabetes Father    Hypertension Mother    Diabetes Mother    Crohn's disease Sister     Medications:   Current Outpatient Medications on File Prior to Visit  Medication Sig Dispense Refill   amLODipine  (NORVASC ) 10 MG tablet Take 10 mg by mouth daily.     aspirin  EC 81 MG tablet Take 81 mg by mouth daily. Swallow whole.     clopidogrel  (PLAVIX ) 75 MG tablet Take 1 tablet (75 mg total) by mouth daily. 30 tablet 2   co-enzyme Q-10 30 MG capsule Take 200 mg by mouth 3 (three) times daily.     empagliflozin  (JARDIANCE ) 10 MG TABS tablet Take 1 tablet (10 mg total) by mouth daily before breakfast. 30 tablet 5   Lysine HCl ([employer] L-LYSINE) 1000 MG TABS Take 1,000 mg by mouth daily.     magnesium  oxide (MAG-OX) 400 (240 Mg) MG tablet Take 400 mg by mouth daily.     methocarbamol (ROBAXIN) 750 MG tablet Take 1 tablet (750 mg total) by mouth every 8 (eight) hours as needed for muscle spasms. 60 tablet 5   pantoprazole  (PROTONIX ) 40 MG tablet Take 1 tablet (40 mg total) by mouth  daily. 30 tablet 5   rosuvastatin  (CRESTOR ) 40 MG tablet Take 1 tablet (40 mg total) by mouth daily. 90 tablet 2   Turmeric (  QC TUMERIC COMPLEX PO) Take 1,000 mg by mouth daily.     Vitamin D , Ergocalciferol , 50000 units CAPS Take 5,000 Int'l Units/day by mouth daily.     No current facility-administered medications on file prior to visit.    Allergies:   Allergies  Allergen Reactions   Lipitor [Atorvastatin] Other (See Comments)    Myalgia and joint pain      OBJECTIVE:  Physical Exam  Vitals:   06/22/24 0904  BP: 125/82  Pulse: 77  SpO2: 98%  Weight: 235 lb 3.2 oz (106.7 kg)  Height: 5' 6 (1.676 m)   Body mass index is 37.96 kg/m. No results found.  General: well developed, well nourished, very pleasant middle-aged Caucasian female, seated, in no evident distress  Neurologic Exam Mental Status: Awake and fully alert.  Fluent speech and language.  Oriented to place and time. Recent and remote memory intact. Attention span, concentration and fund of knowledge appropriate. Mood and affect appropriate.  Cranial Nerves: Pupils equal, briskly reactive to light. Extraocular movements full without nystagmus. Visual fields full to confrontation. Hearing intact. Facial sensation intact. Mild right nasolabial fold flattening.  Tongue, palate moves normally and symmetrically.  Motor: Normal bulk and tone. Normal strength in all tested extremity muscles Sensory.: intact to touch , pinprick , position and vibratory sensation although subjective numbness bilaterally Coordination: Rapid alternating movements normal in all extremities. Finger-to-nose and heel-to-shin performed accurately bilaterally. Gait and Station: Arises from chair without difficulty. Stance is normal. Gait demonstrates antalgic gait with mild favoring of right side due to low back pain Reflexes: 1+ and symmetric. Toes downgoing.     NIHSS  1 Modified Rankin  1      ASSESSMENT: Erica Graham is a 58 y.o.  year old female with recent bilateral corpus callosum infarcts on 04/16/2024 suspected cardioembolic secondary to unclear source.  History of left frontal and BG infarct on 11/07/2023 likely due to large vessel disease from severe left ICA bulb and siphon stenosis. Vascular risk factors include carotid artery stenosis, HTN, HLD, uncontrolled DM, tobacco use and obesity.      PLAN:  Cryptogenic stroke Hx of Left frontal and BG stroke:  Residual deficits: bilateral upper and lower extremity numbness. Discussed typical recovery time.  Continue Plavix  and rosuvastatin  (Crestor ) 40 mg daily for secondary stroke prevention managed/prescribed by PCP.  Advised to discontinue aspirin  at this time as 3 weeks DAPT completed.  Complaining of myalgias on Crestor , will repeat lipid panel today. If LDL below or near goal of <29, will reduce dose or consider alternative therapy.  Further/prior work up: 2D bubble 10/2023 without evidence of PFO 30-day cardiac event monitor completed 10/12 - will send message to cardiology to f/u on results as not yet read Will complete hypercoagulable labs as relatively young and has had multiple strokes Order placed to complete HST to rule out apnea as potential contributing cause Discussed secondary stroke prevention measures and importance of establishing care with PCP as scheduled in July for routine follow up for aggressive stroke risk factor management including BP goal<130/90, HLD with LDL goal<70 and DM with A1c.<7 .  Stroke labs 04/2024: LDL 153, A1c 7.1 I have gone over the pathophysiology of stroke, warning signs and symptoms, risk factors and their management in some detail with instructions to go to the closest emergency room for symptoms of concern.  Lumbar radiculopathy Right sided sciatica Chronic issue with recent exacerbation post fall, declines interest in repeat imaging Referral placed to PT. If pain  persists or worsens with PT, will pursue imaging at that  time Recommend trying baclofen 10mg  TID PRN, advised not to combine with Robaxin Start steroid taper pack per patient request - she is diabetic and she was advised possible increase in glucose levels. She will closely monitor and discontinue if needed  Carotid stenosis: Continues to be followed by vascular surgery S/p L CEA 11/15/2023 without complication Carotid duplex 04/2024 without significant stenosis bilaterally  Tobacco use: Reports complete cessation since discharge and was congratulated on this achievement!  Discussed importance of continued tobacco cessation    Follow-up in 3 to 4 months or call earlier if needed   CC:  [facility] provider: Dr. Rosemarie PCP: Bevely Doffing, FNP    I personally spent a total of 50 minutes in the care of the patient today including preparing to see the patient, getting/reviewing separately obtained history, performing a medically appropriate exam/evaluation, counseling and educating, placing orders, and documenting clinical information in the EHR.   Harlene Bogaert, AGNP-BC  [facility] [address] [place], [place] [address]  Phone [phone] Fax [phone] Note: This document was prepared with digital dictation and possible smart phrase technology. Any transcriptional errors that result from this process are unintentional.

## 2024-06-22 ENCOUNTER — Encounter: Payer: Self-pay | Admitting: Adult Health

## 2024-06-22 ENCOUNTER — Ambulatory Visit (INDEPENDENT_AMBULATORY_CARE_PROVIDER_SITE_OTHER): Admitting: Adult Health

## 2024-06-22 VITALS — BP 125/82 | HR 77 | Ht 66.0 in | Wt 235.2 lb

## 2024-06-22 DIAGNOSIS — M5416 Radiculopathy, lumbar region: Secondary | ICD-10-CM | POA: Diagnosis not present

## 2024-06-22 DIAGNOSIS — Z9189 Other specified personal risk factors, not elsewhere classified: Secondary | ICD-10-CM

## 2024-06-22 DIAGNOSIS — Z87891 Personal history of nicotine dependence: Secondary | ICD-10-CM | POA: Diagnosis not present

## 2024-06-22 DIAGNOSIS — E785 Hyperlipidemia, unspecified: Secondary | ICD-10-CM | POA: Diagnosis not present

## 2024-06-22 DIAGNOSIS — I639 Cerebral infarction, unspecified: Secondary | ICD-10-CM | POA: Diagnosis not present

## 2024-06-22 DIAGNOSIS — I63232 Cerebral infarction due to unspecified occlusion or stenosis of left carotid arteries: Secondary | ICD-10-CM | POA: Diagnosis not present

## 2024-06-22 DIAGNOSIS — I6522 Occlusion and stenosis of left carotid artery: Secondary | ICD-10-CM

## 2024-06-22 MED ORDER — METHYLPREDNISOLONE 4 MG PO TBPK: ORAL_TABLET | ORAL | 0 refills | Status: AC

## 2024-06-22 MED ORDER — CLOPIDOGREL BISULFATE 75 MG PO TABS: 75.0000 mg | ORAL_TABLET | Freq: Every day | ORAL | 3 refills | Status: AC

## 2024-06-22 MED ORDER — BACLOFEN 10 MG PO TABS: 10.0000 mg | ORAL_TABLET | Freq: Three times a day (TID) | ORAL | 11 refills | Status: AC | PRN

## 2024-06-22 NOTE — Telephone Encounter (Signed)
 Cardiac monitor completed 10/12. Can you please follow up on this? Results still not available. Thank you!

## 2024-06-22 NOTE — Patient Instructions (Signed)
 Lab work will be completed to rule out underlying clotting disorder   You will be called to schedule a repeat sleep study to ensure no underlying sleep apnea  Prescription placed for baclofen 10mg  three times daily as needed for low back pain - do not take baclofen and robaxin together  Steroid taper pack sent to pharmacy - please watch sugar levels closely as this can increase your levels  Referral placed to start PT to help with low back pain  Continue clopidogrel  75 mg daily  and Crestor   for secondary stroke prevention You can stop aspirin  at this time  We will check cholesterol levels today, if normal can consider reducing Crestor  dosage or may have to consider changing to a different medication  Continue to follow up with PCP regarding cholesterol, blood pressure and diabetes management  Maintain strict control of hypertension with blood pressure goal below 130/90, diabetes with hemoglobin A1c goal below 7.0 % and cholesterol with LDL cholesterol (bad cholesterol) goal below 70 mg/dL.   Signs of a Stroke? Follow the BEFAST method:  Balance Watch for a sudden loss of balance, trouble with coordination or vertigo Eyes Is there a sudden loss of vision in one or both eyes? Or double vision?  Face: Ask the person to smile. Does one side of the face droop or is it numb?  Arms: Ask the person to raise both arms. Does one arm drift downward? Is there weakness or numbness of a leg? Speech: Ask the person to repeat a simple phrase. Does the speech sound slurred/strange? Is the person confused ? Time: If you observe any of these signs, call 911.      Followup in the future with me in 3-4 months or call earlier if needed       Thank you for coming to see us  at [facility]. I hope we have been able to provide you high quality care today.  You may receive a patient satisfaction survey over the next few weeks. We would appreciate your feedback and comments so that we  may continue to improve ourselves and the health of our patients.

## 2024-06-27 ENCOUNTER — Ambulatory Visit: Payer: Self-pay | Admitting: Adult Health

## 2024-06-27 DIAGNOSIS — R7689 Other specified abnormal immunological findings in serum: Secondary | ICD-10-CM

## 2024-06-27 DIAGNOSIS — R760 Raised antibody titer: Secondary | ICD-10-CM

## 2024-06-27 DIAGNOSIS — E7211 Homocystinuria: Secondary | ICD-10-CM

## 2024-06-27 DIAGNOSIS — I639 Cerebral infarction, unspecified: Secondary | ICD-10-CM

## 2024-06-27 LAB — CARDIOLIPIN ANTIBODIES, IGG, IGM, IGA
Anticardiolipin IgA: <9 U/mL (ref 0–11)
Anticardiolipin IgG: <9 GPL U/mL (ref 0–14)
Anticardiolipin IgM: 15 [MPL'U]/mL — ABNORMAL HIGH (ref 0–12)

## 2024-06-27 LAB — LIPID PANEL
Chol/HDL Ratio: 2.6 ratio (ref 0.0–4.4)
Cholesterol, Total: 129 mg/dL (ref 100–199)
HDL: 49 mg/dL (ref >39)
LDL Chol Calc (NIH): 57 mg/dL (ref 0–99)
Triglycerides: 132 mg/dL (ref 0–149)
VLDL Cholesterol Cal: 23 mg/dL (ref 5–40)

## 2024-06-27 LAB — LUPUS ANTICOAGULANT
Dilute Viper Venom Time: 34.8 s (ref 0.0–47.0)
PTT Lupus Anticoagulant: 44.4 s — AB (ref 0.0–43.5)
Thrombin Time: 20.2 s (ref 0.0–23.0)
dPT Confirm Ratio: 0.93 ratio (ref 0.00–1.34)
dPT: 28.1 s (ref 0.0–47.6)

## 2024-06-27 LAB — ENA+DNA/DS+SJORGEN'S
ENA RNP Ab: 2.7 AI — ABNORMAL HIGH (ref 0.0–0.9)
ENA SM Ab Ser-aCnc: <0.2 AI (ref 0.0–0.9)
ENA SSA (RO) Ab: 0.2 AI (ref 0.0–0.9)
ENA SSB (LA) Ab: <0.2 AI (ref 0.0–0.9)
dsDNA Ab: 1 [IU]/mL (ref 0–9)

## 2024-06-27 LAB — FACTOR 5 LEIDEN

## 2024-06-27 LAB — PTT-LA MIX: PTT-LA Mix: 37.1 s (ref 0.0–40.5)

## 2024-06-27 LAB — VITAMIN B12: Vitamin B-12: 342 pg/mL (ref 232–1245)

## 2024-06-27 LAB — PROTEIN C ACTIVITY: Protein C Activity: 117 % (ref 73–180)

## 2024-06-27 LAB — PROTEIN S PANEL
Protein S Activity: 87 % (ref 63–140)
Protein S Ag, Free: 112 % (ref 61–136)
Protein S Ag, Total: 103 % (ref 60–150)

## 2024-06-27 LAB — ANA W/REFLEX: Anti Nuclear Antibody (ANA): POSITIVE — AB

## 2024-06-27 LAB — PTT-LA INCUB MIX: PTT-LA Incub Mix: 39.2 s (ref 0.0–40.5)

## 2024-06-27 LAB — HOMOCYSTEINE: Homocysteine: 26.9 umol/L — ABNORMAL HIGH (ref 0.0–14.5)

## 2024-06-27 MED ORDER — FOLIC ACID 1 MG PO TABS: 1.0000 mg | ORAL_TABLET | Freq: Every day | ORAL | 11 refills | Status: AC

## 2024-06-27 MED ORDER — ROSUVASTATIN CALCIUM 20 MG PO TABS
20.0000 mg | ORAL_TABLET | Freq: Every day | ORAL | 5 refills | Status: AC
Start: 2024-06-27 — End: ?

## 2024-07-02 ENCOUNTER — Ambulatory Visit: Payer: Self-pay

## 2024-07-03 NOTE — Progress Notes (Signed)
 I agree with the above plan

## 2024-07-04 ENCOUNTER — Telehealth: Payer: Self-pay | Admitting: Adult Health

## 2024-07-04 NOTE — Telephone Encounter (Signed)
 [facility] pendig

## 2024-07-04 NOTE — Telephone Encounter (Signed)
 [facility] blue no auth req via fax * Dr. Chalice to read*

## 2024-07-13 ENCOUNTER — Inpatient Hospital Stay

## 2024-07-13 ENCOUNTER — Inpatient Hospital Stay: Admitting: Medical Oncology

## 2024-07-13 ENCOUNTER — Encounter: Payer: Self-pay | Admitting: Medical Oncology

## 2024-07-13 VITALS — BP 134/75 | HR 81 | Resp 18 | Ht 66.0 in | Wt 232.8 lb

## 2024-07-13 DIAGNOSIS — R7989 Other specified abnormal findings of blood chemistry: Secondary | ICD-10-CM | POA: Diagnosis not present

## 2024-07-13 DIAGNOSIS — I639 Cerebral infarction, unspecified: Secondary | ICD-10-CM

## 2024-07-13 DIAGNOSIS — Z8673 Personal history of transient ischemic attack (TIA), and cerebral infarction without residual deficits: Secondary | ICD-10-CM | POA: Diagnosis not present

## 2024-07-13 NOTE — Progress Notes (Signed)
 Virtual Visit Progress Note  Ms. Erica Graham,you are scheduled for a virtual visit with your provider today.    Just as we do with appointments in the office, we must obtain your consent to participate.  Your consent will be active for this visit and any virtual visit you may have with one of our providers in the next 365 days.    If you have a MyChart account, I can also send a copy of this consent to you electronically.  All virtual visits are billed to your insurance company just like a traditional visit in the office.  As this is a virtual visit, video technology does not allow for your provider to perform a traditional examination.  This may limit your provider's ability to fully assess your condition.  If your provider identifies any concerns that need to be evaluated in person or the need to arrange testing such as labs, EKG, etc, we will make arrangements to do so.    Although advances in technology are sophisticated, we cannot ensure that it will always work on either your end or our end.  If the connection with a video visit is poor, we may have to switch to a telephone visit.  With either a video or telephone visit, we are not always able to ensure that we have a secure connection.   I need to obtain your verbal consent now.   Are you willing to proceed with your visit today?   Erica Graham has provided verbal consent on 07/13/2024 for a virtual visit (video or telephone).   Erica CHRISTELLA Dais, PA-C 07/13/2024  1:39 PM    I connected with Erica Graham on 07/13/24 at  1:00 PM EST by video enabled telemedicine visit and verified that I am speaking with the correct person using two identifiers. They are new to our practice.   I discussed the limitations, risks, security and privacy concerns of performing an evaluation and management service by telemedicine and the availability of in-person appointments. I also discussed with the patient that there may be a patient responsible charge  related to this service. The patient expressed understanding and agreed to proceed.   Other persons participating in the visit and their role in the encounter: None  Patient's location: [place] Provider's location: Clinic   Chief Complaint/Reason for Referral: hypercoagulable state    Patient Care Team: Bevely Doffing, FNP as PCP - General (Family Medicine)   Name of the patient: Erica Graham  [id number]  1965/12/30   Date of visit: 07/13/24  History of Presenting Illness-   Her first ever blood clot was March 21st 2025. 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 She was then started on Plavix  and 81 mg asa. She subsequently had surgery on the carotid artery stenosis which she reports went well. Two weeks after her carotid surgery and was weaned off of the Plavix  and onto a baby asa only. She then had another stroke on Sept 5th 2025 involving two different locations of concern within the brain. 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 She was restarted on Plavix .  No family history of bleeding or clotting disorders.   No history of MI, PE, DVT.   She does not use tobacco products currently - she quit after her first stroke  No on any hormonal medication  No recent procedures.   No flights- she  did a trip to [place] after the second stroke.   She states that she has tolerated the Plavix  ok but has noticed bruising.   There has been no bleeding to her knowledge: denies epistaxis, gingivitis, hemoptysis, hematemesis, hematuria, melena, excessive bruising, blood donation.   She does have diabetes with a recent A1c of 7.1%- her A1C was 9.1 around the time of her stroke.   She was started on folic acid . She is taking this medication. She has been taking this medication for about 2 weeks. She denies side effects.   Of note she has a positive ANA- she previously was  diagnosed with RA and fibromyalgia. She does not know if she was ever on medication for her RA. She is not currently. She does not take anything for pain at this time. RA seems to affect her elbows, hands, back, hips.   No planned upcoming procedures.    Review of systems- Review of Systems  Constitutional: Negative.   HENT:  Positive for tinnitus. Negative for congestion, ear discharge, ear pain, hearing loss, nosebleeds, sinus pain and sore throat.   Eyes: Negative.   Respiratory: Negative.  Negative for stridor.   Cardiovascular: Negative.   Gastrointestinal:  Positive for constipation (since her first stroke).  Genitourinary: Negative.   Musculoskeletal:  Positive for back pain and joint pain.  Skin: Negative.   Neurological: Negative.   Endo/Heme/Allergies:  Negative for environmental allergies and polydipsia. Bruises/bleeds easily.  Psychiatric/Behavioral: Negative.       Allergies  Allergen Reactions   Lipitor [Atorvastatin] Other (See Comments)    Myalgia and joint pain    Past Medical History:  Diagnosis Date   Acute CVA (cerebrovascular accident) (HCC) 11/07/2023   Anxiety 03/05/2016   Arthritis    rheumatoid   Asthma    Body aches 03/05/2016   Depression    Emphysema of lung (HCC)    Fibromyalgia    GERD (gastroesophageal reflux disease)    Hyperlipidemia    Hypertension    Obesity    Recent onset of diabetes mellitus (HCC) 03/10/2016   Referred to Dr Lenis and rx metformin  500 mg bid and zocor  20 mg 1 daily   Varicose vein of leg 03/05/2016   Vitiligo 03/05/2016    Past Surgical History:  Procedure Laterality Date   CAROTID ANGIOGRAPHY N/A 11/10/2023   Procedure: CAROTID ANGIOGRAPHY;  Surgeon: Lanis Fonda BRAVO, MD;  Location: [facility] INVASIVE CV LAB;  Service: Cardiovascular;  Laterality: N/A;   CHOLECYSTECTOMY     COLONOSCOPY N/A 08/20/2016   Procedure: COLONOSCOPY;  Surgeon: Claudis RAYMOND Rivet, MD;  Location: [facility] ENDO SUITE;  Service: Endoscopy;  Laterality: N/A;   830    DILATION AND CURETTAGE OF UTERUS     ENDARTERECTOMY Left 11/15/2023   Procedure: ENDARTERECTOMY, CAROTID;  Surgeon: Gretta Lonni PARAS, MD;  Location[facility];  Service: Vascular;  Laterality: Left;   GALLBLADDER SURGERY     LEG SURGERY Left    broke leg   PATCH ANGIOPLASTY  11/15/2023   Procedure: ANGIOPLASTY, USING PATCH GRAFT;  Surgeon: Gretta Lonni PARAS, MD;  Location: [facility] OR;  Service: Vascular;;   POLYPECTOMY  08/20/2016   Procedure: POLYPECTOMY;  Surgeon: Claudis RAYMOND Rivet, MD;  Location: [facility];  Service: Endoscopy;;  colon   TUBAL LIGATION      Social History   Socioeconomic History   Marital status: Divorced    Spouse name: Not on file   Number of children: 2   Years of education: 48  Highest education level: Not on file  Occupational History   Occupation: hairdresser    Comment: self  Tobacco Use   Smoking status: Former    Current packs/day: 0.00    Average packs/day: 1 pack/day for 20.0 years (20.0 ttl pk-yrs)    Types: Cigarettes    Quit date: 11/06/2023    Years since quitting: 0.6   Smokeless tobacco: Never  Vaping Use   Vaping status: Never Used  Substance and Sexual Activity   Alcohol  use: Yes    Comment: occ 1 monthly    Drug use: No   Sexual activity: Not Currently    Birth control/protection: Surgical    Comment: tubal  Other Topics Concern   Not on file  Social History Narrative   Lives at home with two children, son is 71, daughter is 22   Social Drivers of Corporate Investment Banker Strain: Not on file  Food Insecurity: No Food Insecurity (07/13/2024)   Hunger Vital Sign    Worried About Running Out of Food in the Last Year: Never true    Ran Out of Food in the Last Year: Never true  Transportation Needs: No Transportation Needs (07/13/2024)   PRAPARE - Administrator, Civil Service (Medical): No    Lack of Transportation (Non-Medical): No  Physical Activity: Not on file  Stress: Not on file  Social Connections:  Not on file  Intimate Partner Violence: Not At Risk (07/13/2024)   Humiliation, Afraid, Rape, and Kick questionnaire    Fear of Current or Ex-Partner: No    Emotionally Abused: No    Physically Abused: No    Sexually Abused: No    Immunization History  Administered Date(s) Administered   Influenza,inj,Quad PF,6+ Mos 05/28/2016, 06/03/2017   Pneumococcal Conjugate-13 05/28/2016   Pneumococcal Polysaccharide-23 06/03/2017   Tdap 09/03/2016    Family History  Problem Relation Age of Onset   Cancer Paternal Grandfather        prostate   Diabetes Paternal Grandmother    Emphysema Maternal Grandmother    Cancer Maternal Grandmother        lung   Alcohol  abuse Maternal Grandfather    Diabetes Father    Hypertension Mother    Diabetes Mother    Crohn's disease Sister      Current Outpatient Medications:    amLODipine  (NORVASC ) 10 MG tablet, Take 10 mg by mouth daily., Disp: , Rfl:    baclofen  (LIORESAL ) 10 MG tablet, Take 1 tablet (10 mg total) by mouth 3 (three) times daily as needed for muscle spasms., Disp: 90 each, Rfl: 11   clopidogrel  (PLAVIX ) 75 MG tablet, Take 1 tablet (75 mg total) by mouth daily., Disp: 90 tablet, Rfl: 3   co-enzyme Q-10 30 MG capsule, Take 200 mg by mouth 3 (three) times daily., Disp: , Rfl:    empagliflozin  (JARDIANCE ) 10 MG TABS tablet, Take 1 tablet (10 mg total) by mouth daily before breakfast., Disp: 30 tablet, Rfl: 5   folic acid  (FOLVITE ) 1 MG tablet, Take 1 tablet (1 mg total) by mouth daily., Disp: 30 tablet, Rfl: 11   Lysine HCl ([employer]S L-LYSINE) 1000 MG TABS, Take 1,000 mg by mouth daily., Disp: , Rfl:    magnesium  oxide (MAG-OX) 400 (240 Mg) MG tablet, Take 400 mg by mouth daily., Disp: , Rfl:    methocarbamol  (ROBAXIN ) 750 MG tablet, Take 1 tablet (750 mg total) by mouth every 8 (eight) hours as needed for muscle spasms., Disp: 60  tablet, Rfl: 5   methylPREDNISolone  (MEDROL  DOSEPAK) 4 MG TBPK tablet, Taper pack as directed, Disp: 1 each, Rfl:  0   pantoprazole  (PROTONIX ) 40 MG tablet, Take 1 tablet (40 mg total) by mouth daily., Disp: 30 tablet, Rfl: 5   rosuvastatin  (CRESTOR ) 20 MG tablet, Take 1 tablet (20 mg total) by mouth daily., Disp: 30 tablet, Rfl: 5   Turmeric (QC TUMERIC COMPLEX PO), Take 1,000 mg by mouth daily., Disp: , Rfl:    Vitamin D , Ergocalciferol , 50000 units CAPS, Take 5,000 Int'l Units/day by mouth daily., Disp: , Rfl:   Physical exam: Exam limited due to telemedicine Physical Exam Constitutional:      General: She is not in acute distress.    Appearance: Normal appearance. She is not ill-appearing, toxic-appearing or diaphoretic.  Neurological:     Mental Status: She is alert and oriented to person, place, and time.       Visit Diagnosis 1. Acute CVA (cerebrovascular accident) (HCC)   2. Elevated homocysteine     Assessment and plan- Patient is a 58 y.o. female who was referred to us  for evaluation of positive clotting risk factors found on blood work evaluation following recurrent strokes.    Review of external labs from 06/22/2024 shows an elevated homocysteine level of 26.9 along with a low B12 of 342.   I have recommended that she continue her folic acid  supplementation as well as start 1,000 mcg of B12 once daily. Since she has only been on folic acid  supplementation for 2 weeks I would suggest lab repeat in 10 weeks. We will repeat all clotting factors as well as labs for folate and B12. For now I have recommended that she continue Plavix  and her aspirin  as clotting prevention.I congratulated her on her smoking cessation and better control of her diabetes as both of these will help with clotting event protection.   Disposition: RTC 10 weeks APP, labs (Hypercoagulable work up, CBC, CMP, B12, Folate)  Patient expressed understanding and was in agreement with this plan. She also understands that She can call clinic at any time with any questions, concerns, or complaints.   I discussed the  assessment and treatment plan with the patient. The patient was provided an opportunity to ask questions and all were answered. The patient agreed with the plan and demonstrated an understanding of the instructions.   The patient was advised to call back or seek an in-person evaluation if the symptoms worsen or if the condition fails to improve as anticipated.   I spent 35 minutes face-to-face video visit time dedicated to the care of this patient on the date of this encounter to include pre-visit review of outside labs and notes, face-to-face time with the patient, and post visit ordering of testing/documentation.    Thank you for allowing me to participate in the care of this very pleasant patient.   Erica Dais PA-C 07/13/24   [facility] [address] [place], [place] [address] Phone: [phone]

## 2024-07-17 DIAGNOSIS — G464 Cerebellar stroke syndrome: Secondary | ICD-10-CM | POA: Diagnosis not present

## 2024-07-17 DIAGNOSIS — I639 Cerebral infarction, unspecified: Secondary | ICD-10-CM | POA: Diagnosis not present

## 2024-07-19 ENCOUNTER — Ambulatory Visit: Admitting: Neurology

## 2024-07-24 ENCOUNTER — Other Ambulatory Visit: Payer: Self-pay

## 2024-07-24 DIAGNOSIS — I10 Essential (primary) hypertension: Secondary | ICD-10-CM

## 2024-08-02 ENCOUNTER — Other Ambulatory Visit: Payer: Self-pay

## 2024-08-02 DIAGNOSIS — E119 Type 2 diabetes mellitus without complications: Secondary | ICD-10-CM

## 2024-08-07 ENCOUNTER — Encounter: Payer: Self-pay | Admitting: *Deleted

## 2024-08-13 ENCOUNTER — Other Ambulatory Visit: Payer: Self-pay

## 2024-08-13 DIAGNOSIS — N184 Chronic kidney disease, stage 4 (severe): Secondary | ICD-10-CM

## 2024-08-13 DIAGNOSIS — E119 Type 2 diabetes mellitus without complications: Secondary | ICD-10-CM

## 2024-08-14 NOTE — Therapy (Signed)
 " OUTPATIENT PHYSICAL THERAPY THORACOLUMBAR EVALUATION   [name] MRN: [id number] DOB:[birth year], 59 y.o., female Today's Date: 08/17/2024  END OF SESSION:  PT End of Session - 08/17/24 0948     Visit Number 1    Number of Visits 13    Date for Recertification  09/15/24    Authorization Type Medicaid [employer]    Authorization Time Period Auth requested    Progress Note Due on Visit 10    PT Start Time 0948    PT Stop Time 1030    PT Time Calculation (min) 42 min    Activity Tolerance Patient tolerated treatment well    Behavior During Therapy [facility] for tasks assessed/performed          Past Medical History:  Diagnosis Date   Acute CVA (cerebrovascular accident) (HCC) 11/07/2023   Anxiety 03/05/2016   Arthritis    rheumatoid   Asthma    Body aches 03/05/2016   Depression    Emphysema of lung (HCC)    Fibromyalgia    GERD (gastroesophageal reflux disease)    Hyperlipidemia    Hypertension    Obesity    Recent onset of diabetes mellitus (HCC) 03/10/2016   Referred to Dr Lenis and rx metformin  500 mg bid and zocor  20 mg 1 daily   Varicose vein of leg 03/05/2016   Vitiligo 03/05/2016   Past Surgical History:  Procedure Laterality Date   CAROTID ANGIOGRAPHY N/A 11/10/2023   Procedure: CAROTID ANGIOGRAPHY;  Surgeon: Lanis Fonda BRAVO, MD;  Location: [facility] INVASIVE CV LAB;  Service: Cardiovascular;  Laterality: N/A;   CHOLECYSTECTOMY     COLONOSCOPY N/A 08/20/2016   Procedure: COLONOSCOPY;  Surgeon: Claudis RAYMOND Rivet, MD;  Location: [facility] ENDO SUITE;  Service: Endoscopy;  Laterality: N/A;  830    DILATION AND CURETTAGE OF UTERUS     ENDARTERECTOMY Left 11/15/2023   Procedure: ENDARTERECTOMY, CAROTID;  Surgeon: Gretta Lonni PARAS, MD;  Location: [facility] OR;  Service: Vascular;  Laterality: Left;   GALLBLADDER SURGERY     LEG SURGERY Left    broke leg   PATCH ANGIOPLASTY  11/15/2023   Procedure: ANGIOPLASTY, USING PATCH GRAFT;  Surgeon: Gretta Lonni PARAS, MD;   Location: [facility] OR;  Service: Vascular;;   POLYPECTOMY  08/20/2016   Procedure: POLYPECTOMY;  Surgeon: Claudis RAYMOND Rivet, MD;  Location: [facility] ENDO SUITE;  Service: Endoscopy;;  colon   TUBAL LIGATION     Patient Active Problem List   Diagnosis Date Noted   Gastroesophageal reflux disease without esophagitis 05/23/2024   Stroke (HCC) 04/16/2024   Carotid stenosis 11/15/2023   Symptomatic carotid artery stenosis, left 11/15/2023   Essential hypertension 11/08/2023   Controlled type 2 diabetes mellitus without complication, without long-term current use of insulin  (HCC) 11/08/2023   Acute kidney injury superimposed on chronic kidney disease 11/08/2023   Acute CVA (cerebrovascular accident) (HCC) 11/07/2023   Abdominal aortic atherosclerosis 06/03/2017   Primary osteoarthritis of both hands 04/08/2017   Primary osteoarthritis of both feet 04/08/2017   Primary osteoarthritis of both knees 04/08/2017   scoliosis, lumbar region 04/08/2017   DDD (degenerative disc disease), lumbar 04/08/2017   Positive ANA (antinuclear antibody) 04/08/2017   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   Smoker 03/04/2017   Rheumatoid arthritis (HCC) 12/03/2016   Vitamin D  deficiency 10/15/2016   Tubular adenoma of colon 08/21/2016   Fibromyalgia 05/28/2016   Type 2 diabetes, HbA1c goal < 7% (HCC) 04/30/2016   Mixed hyperlipidemia 04/30/2016   Morbid obesity  due to excess calories (HCC) 04/30/2016   Anxiety 03/05/2016   Varicose vein of leg 03/05/2016    PCP: Bevely Doffing, FNP  REFERRING PROVIDER: Whitfield Raisin, NP  REFERRING DIAG: M54.16 (ICD-10-CM) - Lumbar radiculopathy  Rationale for Evaluation and Treatment: Rehabilitation  THERAPY DIAG:  Other low back pain  Radiculopathy, lumbar region  Impaired functional mobility, balance, gait, and endurance  ONSET DATE: September 2025  SUBJECTIVE:                                                                                                                                                                                            SUBJECTIVE STATEMENT: Pt reports she had 2 strokes since September 2025 and she fell backwards onto water  heater and her backs been bothering her since. Reports she had 5 bulging discs 20 years ago but it got better with time but back pain returned after the strokes.  Reports her back feels stiff, and that it comes and goes.  Has N/T down BLE but RLE is worse, reports N/T down RLE is constant.   PERTINENT HISTORY:  CVA in 04/2024  Currently, she reports continued numbness (odd sensation) from her head down to her toes bilaterally. No significant improvement since discharge. She also notes persistent low back pain with right leg sciatica, reports a fall into her hot water  tank prior to ED presentation and symptoms have been persistent since that time. Has improved some but still greatly bothersome and limiting. PCP rx'd Robaxin  but no significant benefit.  No new stroke/TIA symptoms.   PAIN:  Are you having pain? Yes: NPRS scale: 2/10 for back, legs 3-4/10 Pain location: low back and BLE Pain description: stiff and achy for back, Numbness/tingling down legs Aggravating factors: Bending forward when shampooing hair Relieving factors: Heating pad   PRECAUTIONS: None  RED FLAGS: Reports since CVA her whole R side feels numb including her face, BLE N/T to toes   WEIGHT BEARING RESTRICTIONS: No  FALLS:  Has patient fallen in last 6 months? No  LIVING ENVIRONMENT: Lives with: lives alone Lives in: House/apartment Stairs: No Has following equipment at home: Single point cane, does not use   OCCUPATION: Hair dresser, Full time   PLOF: Independent  PATIENT GOALS: To be pain free  NEXT MD VISIT: February 2026  OBJECTIVE:  Note: Objective measures were completed at Evaluation unless otherwise noted.  DIAGNOSTIC FINDINGS:  IMPRESSION: 1. Multiple foci of restricted diffusion within the corpus callosum,  primarily involving the body on the right and the splenium on the left, compatible with acute non-hemorrhagic infarcts. 2. Chronic encephalomalacia changes medially within the left frontal  lobe with blooming artifact in the left caudate nucleus, presumably from hemosiderin staining. 3. Moderate cerebellar volume loss.  PATIENT SURVEYS:  Modified Oswestry: Modified Oswestry Low Back Pain Disability Questionnaire: 9 / 50 = 18.0 %   Interpretation of scores: Score Category Description  0-20% Minimal Disability The patient can cope with most living activities. Usually no treatment is indicated apart from advice on lifting, sitting and exercise  21-40% Moderate Disability The patient experiences more pain and difficulty with sitting, lifting and standing. Travel and social life are more difficult and they may be disabled from work. Personal care, sexual activity and sleeping are not grossly affected, and the patient can usually be managed by conservative means  41-60% Severe Disability Pain remains the main problem in this group, but activities of daily living are affected. These patients require a detailed investigation  61-80% Crippled Back pain impinges on all aspects of the patients life. Positive intervention is required  81-100% Bed-bound These patients are either bed-bound or exaggerating their symptoms  Bluford FORBES Zoe DELENA Karon DELENA, et al. Surgery versus conservative management of stable thoracolumbar fracture: the PRESTO feasibility RCT. [place] ([place]): [employer]; 2021 Nov. [facility] Technology Assessment, No. 25.62.) Appendix 3, Oswestry Disability Index category descriptors. Available from: Findjewelers.cz  Minimally Clinically Important Difference (MCID) = 12.8%  COGNITION: Overall cognitive status: Within functional limits for tasks assessed     SENSATION: WFL mildly lighter in L2 dermatome    POSTURE: rounded shoulders and forward  head  PALPATION: Unremarkable palpation to lumbar and thoracic spine Unremarkable palpation to R piriformis muscle   LUMBAR ROM:   AROM eval  Flexion Mid shin, slight bend in knees *burning in LB  Extension 10% avail   Right lateral flexion 1 inch past neutral,* sharp pain in back  Left lateral flexion 3 inch above knee  Right rotation 50% avail   Left rotation 50% avail    (Blank rows = not tested)   *=pain    LOWER EXTREMITY MMT:    MMT Right eval Left eval  Hip flexion 4- 4  Hip extension 4+ 4+  Hip abduction 4- 4  Hip adduction    Hip internal rotation    Hip external rotation    Knee flexion    Knee extension    Ankle dorsiflexion 5 5  Ankle plantarflexion    Ankle inversion    Ankle eversion     (Blank rows = not tested)   FUNCTIONAL TESTS:  30 seconds chair stand test 2 minute walk test: Next session  30 seconds chair stand: 8.5 STS, inc N/T in legs and pulling in back, BUE push off from chair   GAIT: Distance walked: 100 feet Assistive device utilized: none Level of assistance: Complete Independence Comments: dec trunk rotation, slightly dec speed  TREATMENT DATE:  08/17/24: PT Eval and HEP  PATIENT EDUCATION:  Education details: PT evaluation, objective findings, POC, Importance of HEP, Precautions, Clinic policies Person educated: Patient Education method: Explanation and Demonstration Education comprehension: verbalized understanding and returned demonstration  HOME EXERCISE PROGRAM: Access Code: [email or web address] URL: [email or web address] Date: 08/17/2024 Prepared by: Rosaria Powell-Butler  Exercises - Prone Press Up On Elbows  - 3 x daily - 7 x weekly - 3 sets - 5 hold - Supine Lower Trunk Rotation  - 2 x daily - 7 x weekly - 2 sets - 10 reps - Hooklying Single Knee to Chest Stretch  - 2 x daily - 7 x weekly  - 2 sets - 10 reps - Standing Lumbar Extension at Wall  - 2 x daily - 7 x weekly - 2 sets - 10 reps  ASSESSMENT:  CLINICAL IMPRESSION: Patient is a 59 y.o. female who was seen today for physical therapy evaluation and treatment for M54.16 (ICD-10-CM) - Lumbar radiculopathy.  On this date, patient demonstrates slightly impaired self perception of function, decreased lumbar ROM/mobility, decreased RLE strength, decreased endurance, and altered gait, all of which may be contributing to patient's increased low back and LE radiating pain, decreased activity tolerance, and impairing their overall function. Patient demonstrates a preference to extension positioning this date. HEP designed with that in mind. Patient reports prolonged standing and forward bending for job duties. Encouraged patient to sit a few minutes every hour during work day as well as lumbar extension against wall. Patient will benefit from skilled physical therapy to address the above/below deficits in order to improve pain and overall function.    OBJECTIVE IMPAIRMENTS: Abnormal gait, decreased activity tolerance, decreased endurance, decreased mobility, decreased ROM, decreased strength, impaired perceived functional ability, impaired sensation, improper body mechanics, postural dysfunction, and pain.   ACTIVITY LIMITATIONS: carrying, lifting, bending, standing, and transfers  PARTICIPATION LIMITATIONS: community activity and occupation  PERSONAL FACTORS: Time since onset of injury/illness/exacerbation are also affecting patient's functional outcome.   REHAB POTENTIAL: Good  CLINICAL DECISION MAKING: Stable/uncomplicated  EVALUATION COMPLEXITY: Low   GOALS: Goals reviewed with patient? No  SHORT TERM GOALS: Target date: 09/08/24 Patient will be independent with performance of HEP to demonstrate adequate self management of symptoms.  Baseline:  Goal status: INITIAL  2.   Patient will report at least a 25% improvement  with function and/or pain reduction overall since beginning PT. Baseline:  Goal status: INITIAL   LONG TERM GOALS: Target date: 09/29/24 Patient will improve Modified Oswestry score by 12.8 % in order to demonstrate improved self-perceived disability and overall function while meeting MCID.  Baseline: Goal status: INITIAL   2.  Patient will improve  30 second chair stand  test by at least 2 STS  in order to demonstrate improved LE strength and endurance required for prolonged standing/ambulation. Baseline:  Goal status: INITIAL   3.  Patient will improve lumbar extension AROM to at least 50% of available motion in order to demonstrate improved  mobility needed for functional activities such as overhead reaching. Baseline:  Goal status: INITIAL   4.  Patient will report overall 50% improvement since beginning PT. Baseline:  Goal status: INITIAL  5.  Patient will report decreased pain in low back and BLE at end of work day, as 3/10 or less, to demonstrate improved activity tolerance.  Baseline:  Goal status: INITIAL   PLAN:  PT FREQUENCY: 1-2x/week  PT DURATION: 6 weeks  PLANNED INTERVENTIONS: 97164- PT Re-evaluation, 97110-Therapeutic exercises, 97530- Therapeutic activity, [id number]- Neuromuscular re-education, 97535- Self Care, 02859-  Manual therapy, [id number]- Gait training, 02967- Electrical stimulation (manual), [id number]- Ultrasound, [id number]- Traction (mechanical), [phone] (1-2 muscles), 20561 (3+ muscles)- Dry Needling, Patient/Family education, Balance training, Stair training, Taping, Joint mobilization, Spinal mobilization, Cryotherapy, and Moist heat.  PLAN FOR NEXT SESSION: Review HEP and goals, 2 minute walk test, assess hamstring and piriformis  11:12 AM, 2024-08-24 Rosaria Settler, PT, DPT [facility]    Managed Medicaid Authorization Request Treatment Start Date: Aug 24, 2024  Visit Dx Codes:  M54.59 M54.16 Z74.09  Functional Tool Score:   Modified Oswestry Low Back Pain Disability Questionnaire: 9 / 50 = 18.0 % 30 seconds chair stand: 8.5 STS  For all possible CPT codes, reference the Planned Interventions line above.     Check all conditions that are expected to impact treatment: {Conditions expected to impact treatment:None of these apply   If treatment provided at initial evaluation, no treatment charged due to lack of authorization.     "

## 2024-08-17 ENCOUNTER — Other Ambulatory Visit: Payer: Self-pay

## 2024-08-17 ENCOUNTER — Encounter (HOSPITAL_COMMUNITY): Payer: Self-pay

## 2024-08-17 ENCOUNTER — Ambulatory Visit (HOSPITAL_COMMUNITY): Attending: Adult Health

## 2024-08-17 DIAGNOSIS — M545 Low back pain, unspecified: Secondary | ICD-10-CM | POA: Insufficient documentation

## 2024-08-17 DIAGNOSIS — Z7409 Other reduced mobility: Secondary | ICD-10-CM | POA: Insufficient documentation

## 2024-08-17 DIAGNOSIS — M5459 Other low back pain: Secondary | ICD-10-CM | POA: Diagnosis present

## 2024-08-17 DIAGNOSIS — M5416 Radiculopathy, lumbar region: Secondary | ICD-10-CM | POA: Insufficient documentation

## 2024-08-21 ENCOUNTER — Telehealth: Payer: Self-pay | Admitting: Adult Health

## 2024-08-21 NOTE — Telephone Encounter (Signed)
Patient called to verify appointment time °

## 2024-08-22 ENCOUNTER — Ambulatory Visit (HOSPITAL_COMMUNITY): Admitting: Physical Therapy

## 2024-08-22 DIAGNOSIS — M545 Low back pain, unspecified: Secondary | ICD-10-CM

## 2024-08-22 DIAGNOSIS — Z7409 Other reduced mobility: Secondary | ICD-10-CM

## 2024-08-22 DIAGNOSIS — M5459 Other low back pain: Secondary | ICD-10-CM

## 2024-08-22 DIAGNOSIS — M5416 Radiculopathy, lumbar region: Secondary | ICD-10-CM

## 2024-08-22 NOTE — Therapy (Signed)
 " OUTPATIENT PHYSICAL THERAPY THORACOLUMBAR TREATMENT   [name] MRN: [id number] DOB:Oct 07, 1965, 59 y.o., female Today's Date: 08/22/2024  END OF SESSION:  PT End of Session - 08/22/24 0735     Visit Number 2    Number of Visits 13    Date for Recertification  09/15/24    Authorization Type Medicaid [employer]    Authorization Time Period approved 4 visits from 1/8 to 2/6    Authorization - Visit Number 1    Authorization - Number of Visits 4    Progress Note Due on Visit 10    PT Start Time 0732    PT Stop Time 0812    PT Time Calculation (min) 40 min    Activity Tolerance Patient tolerated treatment well    Behavior During Therapy [facility] for tasks assessed/performed          Past Medical History:  Diagnosis Date   Acute CVA (cerebrovascular accident) (HCC) 11/07/2023   Anxiety 03/05/2016   Arthritis    rheumatoid   Asthma    Body aches 03/05/2016   Depression    Emphysema of lung (HCC)    Fibromyalgia    GERD (gastroesophageal reflux disease)    Hyperlipidemia    Hypertension    Obesity    Recent onset of diabetes mellitus (HCC) 03/10/2016   Referred to Dr Lenis and rx metformin  500 mg bid and zocor  20 mg 1 daily   Varicose vein of leg 03/05/2016   Vitiligo 03/05/2016   Past Surgical History:  Procedure Laterality Date   CAROTID ANGIOGRAPHY N/A 11/10/2023   Procedure: CAROTID ANGIOGRAPHY;  Surgeon: Lanis Fonda BRAVO, MD;  Location: [facility] INVASIVE CV LAB;  Service: Cardiovascular;  Laterality: N/A;   CHOLECYSTECTOMY     COLONOSCOPY N/A 08/20/2016   Procedure: COLONOSCOPY;  Surgeon: Claudis RAYMOND Rivet, MD;  Location: [facility] ENDO SUITE;  Service: Endoscopy;  Laterality: N/A;  830    DILATION AND CURETTAGE OF UTERUS     ENDARTERECTOMY Left 11/15/2023   Procedure: ENDARTERECTOMY, CAROTID;  Surgeon: Gretta Lonni PARAS, MD;  Location: [facility] OR;  Service: Vascular;  Laterality: Left;   GALLBLADDER SURGERY     LEG SURGERY Left    broke leg   PATCH ANGIOPLASTY   11/15/2023   Procedure: ANGIOPLASTY, USING PATCH GRAFT;  Surgeon: Gretta Lonni PARAS, MD;  Location: [facility] OR;  Service: Vascular;;   POLYPECTOMY  08/20/2016   Procedure: POLYPECTOMY;  Surgeon: Claudis RAYMOND Rivet, MD;  Location: [facility] ENDO SUITE;  Service: Endoscopy;;  colon   TUBAL LIGATION     Patient Active Problem List   Diagnosis Date Noted   Gastroesophageal reflux disease without esophagitis 05/23/2024   Stroke (HCC) 04/16/2024   Carotid stenosis 11/15/2023   Symptomatic carotid artery stenosis, left 11/15/2023   Essential hypertension 11/08/2023   Controlled type 2 diabetes mellitus without complication, without long-term current use of insulin  (HCC) 11/08/2023   Acute kidney injury superimposed on chronic kidney disease 11/08/2023   Acute CVA (cerebrovascular accident) (HCC) 11/07/2023   Abdominal aortic atherosclerosis 06/03/2017   Primary osteoarthritis of both hands 04/08/2017   Primary osteoarthritis of both feet 04/08/2017   Primary osteoarthritis of both knees 04/08/2017   scoliosis, lumbar region 04/08/2017   DDD (degenerative disc disease), lumbar 04/08/2017   Positive ANA (antinuclear antibody) 04/08/2017   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   Smoker 03/04/2017   Rheumatoid arthritis (HCC) 12/03/2016   Vitamin D  deficiency 10/15/2016   Tubular adenoma of colon 08/21/2016  Fibromyalgia 05/28/2016   Type 2 diabetes, HbA1c goal < 7% (HCC) 04/30/2016   Mixed hyperlipidemia 04/30/2016   Morbid obesity due to excess calories (HCC) 04/30/2016   Anxiety 03/05/2016   Varicose vein of leg 03/05/2016    PCP: Bevely Doffing, FNP  REFERRING PROVIDER: Whitfield Raisin, NP  REFERRING DIAG: M54.16 (ICD-10-CM) - Lumbar radiculopathy  Rationale for Evaluation and Treatment: Rehabilitation  THERAPY DIAG:  M54.59  Radiculopathy, lumbar region  Impaired functional mobility, balance, gait, and endurance  Low back pain, unspecified back pain laterality, unspecified chronicity,  unspecified whether sciatica present  ONSET DATE: September 2025  SUBJECTIVE:                                                                                                                                                                                           SUBJECTIVE STATEMENT: Pain is always in her legs.  Seems to be worse in the evenings.  Unsure if symptoms in her legs are related to her stroke or her back; 2/10 pain back and legs.    EVAL:Pt reports she had 2 strokes since September 2025 and she fell backwards onto water  heater and her backs been bothering her since. Reports she had 5 bulging discs 20 years ago but it got better with time but back pain returned after the strokes.  Reports her back feels stiff, and that it comes and goes.  Has N/T down BLE but RLE is worse, reports N/T down RLE is constant.   PERTINENT HISTORY:  CVA in 04/2024  Currently, she reports continued numbness (odd sensation) from her head down to her toes bilaterally. No significant improvement since discharge. She also notes persistent low back pain with right leg sciatica, reports a fall into her hot water  tank prior to ED presentation and symptoms have been persistent since that time. Has improved some but still greatly bothersome and limiting. PCP rx'd Robaxin  but no significant benefit.  No new stroke/TIA symptoms.   PAIN:  Are you having pain? Yes: NPRS scale: 2/10 for back, legs 3-4/10 Pain location: low back and BLE Pain description: stiff and achy for back, Numbness/tingling down legs Aggravating factors: Bending forward when shampooing hair Relieving factors: Heating pad   PRECAUTIONS: None  RED FLAGS: Reports since CVA her whole R side feels numb including her face, BLE N/T to toes   WEIGHT BEARING RESTRICTIONS: No  FALLS:  Has patient fallen in last 6 months? No  LIVING ENVIRONMENT: Lives with: lives alone Lives in: House/apartment Stairs: No Has following equipment at home:  Single point cane, does not use   OCCUPATION: Hair dresser, Full time  PLOF: Independent  PATIENT GOALS: To be pain free  NEXT MD VISIT: February 2026  OBJECTIVE:  Note: Objective measures were completed at Evaluation unless otherwise noted.  DIAGNOSTIC FINDINGS:  IMPRESSION: 1. Multiple foci of restricted diffusion within the corpus callosum, primarily involving the body on the right and the splenium on the left, compatible with acute non-hemorrhagic infarcts. 2. Chronic encephalomalacia changes medially within the left frontal lobe with blooming artifact in the left caudate nucleus, presumably from hemosiderin staining. 3. Moderate cerebellar volume loss.  PATIENT SURVEYS:  Modified Oswestry: Modified Oswestry Low Back Pain Disability Questionnaire: 9 / 50 = 18.0 %   Interpretation of scores: Score Category Description  0-20% Minimal Disability The patient can cope with most living activities. Usually no treatment is indicated apart from advice on lifting, sitting and exercise  21-40% Moderate Disability The patient experiences more pain and difficulty with sitting, lifting and standing. Travel and social life are more difficult and they may be disabled from work. Personal care, sexual activity and sleeping are not grossly affected, and the patient can usually be managed by conservative means  41-60% Severe Disability Pain remains the main problem in this group, but activities of daily living are affected. These patients require a detailed investigation  61-80% Crippled Back pain impinges on all aspects of the patients life. Positive intervention is required  81-100% Bed-bound These patients are either bed-bound or exaggerating their symptoms  Bluford FORBES Zoe DELENA Karon DELENA, et al. Surgery versus conservative management of stable thoracolumbar fracture: the PRESTO feasibility RCT. [place] ([place]): [employer]; 2021 Nov. [facility] Technology Assessment, No. 25.62.)  Appendix 3, Oswestry Disability Index category descriptors. Available from: [email or web address].cz  Minimally Clinically Important Difference (MCID) = 12.8%  COGNITION: Overall cognitive status: Within functional limits for tasks assessed     SENSATION: WFL mildly lighter in L2 dermatome    POSTURE: rounded shoulders and forward head  PALPATION: Unremarkable palpation to lumbar and thoracic spine Unremarkable palpation to R piriformis muscle   LUMBAR ROM:   AROM eval  Flexion Mid shin, slight bend in knees *burning in LB  Extension 10% avail   Right lateral flexion 1 inch past neutral,* sharp pain in back  Left lateral flexion 3 inch above knee  Right rotation 50% avail   Left rotation 50% avail    (Blank rows = not tested)   *=pain    LOWER EXTREMITY MMT:    MMT Right eval Left eval  Hip flexion 4- 4  Hip extension 4+ 4+  Hip abduction 4- 4  Hip adduction    Hip internal rotation    Hip external rotation    Knee flexion    Knee extension    Ankle dorsiflexion 5 5  Ankle plantarflexion    Ankle inversion    Ankle eversion     (Blank rows = not tested)   FUNCTIONAL TESTS:  30 seconds chair stand test 2 minute walk test: Next session  30 seconds chair stand: 8.5 STS, inc N/T in legs and pulling in back, BUE push off from chair   GAIT: Distance walked: 100 feet Assistive device utilized: none Level of assistance: Complete Independence Comments: dec trunk rotation, slightly dec speed  TREATMENT DATE:  08/22/24 Review of HEP and goals Prone lying with moist heat x 5' Prone glute sets 5 hold x 10 Supine: LTR x 10 Single knee to chest 5 x 20 Abdominal bracing 5 x 10 Abdominal bracing red theraband 2 x 10 Updated HEP  08/17/24: PT Eval and HEP                                                                                                                                 PATIENT EDUCATION:  Education details: PT  evaluation, objective findings, POC, Importance of HEP, Precautions, Clinic policies Person educated: Patient Education method: Explanation and Demonstration Education comprehension: verbalized understanding and returned demonstration  HOME EXERCISE PROGRAM: Access Code: [email or web address] URL: [email or web address] Date: 08/17/2024 Prepared by: Rosaria Powell-Butler  Exercises - Prone Press Up On Elbows  - 3 x daily - 7 x weekly - 3 sets - 5 hold - Supine Lower Trunk Rotation  - 2 x daily - 7 x weekly - 2 sets - 10 reps - Hooklying Single Knee to Chest Stretch  - 2 x daily - 7 x weekly - 2 sets - 10 reps - Standing Lumbar Extension at Wall  - 2 x daily - 7 x weekly - 2 sets - 10 reps  ASSESSMENT:  CLINICAL IMPRESSION: Today's session started with a review of HEP and goals.  Patient verbalizes agreement with set rehab goals.  Started with lying in prone with moist heat x 5'. Progressed core strengthening in prone and updated HEP.  Noted weakness right hip abductors > left hip abductors with supine hooklying resisted hip abduction.  Patient with no complaint of increased pain throughout treatment but no centralization of symptoms either.   Patient will benefit from continued skilled therapy servicesto address deficits and promote return to optimal function.      Eval: Patient is a 59 y.o. female who was seen today for physical therapy evaluation and treatment for M54.16 (ICD-10-CM) - Lumbar radiculopathy.  On this date, patient demonstrates slightly impaired self perception of function, decreased lumbar ROM/mobility, decreased RLE strength, decreased endurance, and altered gait, all of which may be contributing to patient's increased low back and LE radiating pain, decreased activity tolerance, and impairing their overall function. Patient demonstrates a preference to extension positioning this date. HEP designed with that in mind. Patient reports prolonged standing and forward bending for  job duties. Encouraged patient to sit a few minutes every hour during work day as well as lumbar extension against wall. Patient will benefit from skilled physical therapy to address the above/below deficits in order to improve pain and overall function.    OBJECTIVE IMPAIRMENTS: Abnormal gait, decreased activity tolerance, decreased endurance, decreased mobility, decreased ROM, decreased strength, impaired perceived functional ability, impaired sensation, improper body mechanics, postural dysfunction, and pain.   ACTIVITY LIMITATIONS: carrying, lifting, bending, standing, and transfers  PARTICIPATION LIMITATIONS: community activity and occupation  PERSONAL FACTORS: Time since onset of injury/illness/exacerbation are also affecting patient's functional outcome.   REHAB POTENTIAL: Good  CLINICAL DECISION MAKING: Stable/uncomplicated  EVALUATION COMPLEXITY: Low   GOALS: Goals reviewed with patient? No  SHORT TERM GOALS: Target date: 09/08/24 Patient will be independent with performance of HEP to demonstrate adequate self  management of symptoms.  Baseline:  Goal status: in progress  2.   Patient will report at least a 25% improvement with function and/or pain reduction overall since beginning PT. Baseline:  Goal status: in progress   LONG TERM GOALS: Target date: 09/29/24 Patient will improve Modified Oswestry score by 12.8 % in order to demonstrate improved self-perceived disability and overall function while meeting MCID.  Baseline: Goal status: in progress   2.  Patient will improve  30 second chair stand  test by at least 2 STS  in order to demonstrate improved LE strength and endurance required for prolonged standing/ambulation. Baseline:  Goal status: in progress   3.  Patient will improve lumbar extension AROM to at least 50% of available motion in order to demonstrate improved  mobility needed for functional activities such as overhead reaching. Baseline:  Goal status: in  progress   4.  Patient will report overall 50% improvement since beginning PT. Baseline:  Goal status:in progress  5.  Patient will report decreased pain in low back and BLE at end of work day, as 3/10 or less, to demonstrate improved activity tolerance.  Baseline:  Goal status: in progress   PLAN:  PT FREQUENCY: 1-2x/week  PT DURATION: 6 weeks  PLANNED INTERVENTIONS: 97164- PT Re-evaluation, 97110-Therapeutic exercises, 97530- Therapeutic activity, [id number]- Neuromuscular re-education, 97535- Self Care, [id number]- Manual therapy, [id number]- Gait training, [phone]- Electrical stimulation (manual), [id number]- Ultrasound, [id number]- Traction (mechanical), [phone] (1-2 muscles), [id number] (3+ muscles)- Dry Needling, Patient/Family education, Balance training, Stair training, Taping, Joint mobilization, Spinal mobilization, Cryotherapy, and Moist heat.  PLAN FOR NEXT SESSION:  2 minute walk test, assess hamstring and piriformis   8:13 AM, 08/22/2024 Nykia Turko Small Jobani Sabado MPT [facility] [place] [phone] Ph:[phone]  "

## 2024-08-30 ENCOUNTER — Ambulatory Visit (HOSPITAL_COMMUNITY)

## 2024-08-30 ENCOUNTER — Encounter (HOSPITAL_COMMUNITY): Payer: Self-pay

## 2024-08-30 DIAGNOSIS — M5459 Other low back pain: Secondary | ICD-10-CM

## 2024-08-30 DIAGNOSIS — M5416 Radiculopathy, lumbar region: Secondary | ICD-10-CM

## 2024-08-30 DIAGNOSIS — M545 Low back pain, unspecified: Secondary | ICD-10-CM

## 2024-08-30 DIAGNOSIS — Z7409 Other reduced mobility: Secondary | ICD-10-CM

## 2024-08-30 NOTE — Therapy (Signed)
 " OUTPATIENT PHYSICAL THERAPY THORACOLUMBAR TREATMENT   [name] MRN: [id number] DOB:[birth year], 59 y.o., female Today's Date: 08/30/2024  END OF SESSION:  PT End of Session - 08/30/24 0730     Visit Number 3    Number of Visits 13    Date for Recertification  09/15/24    Authorization Type Medicaid [employer]    Authorization Time Period approved 4 visits from 1/8 to 2/6    Authorization - Visit Number 2    Authorization - Number of Visits 4    Progress Note Due on Visit 10    PT Start Time 0730    PT Stop Time 0810    PT Time Calculation (min) 40 min    Activity Tolerance Patient tolerated treatment well    Behavior During Therapy [facility] for tasks assessed/performed           Past Medical History:  Diagnosis Date   Acute CVA (cerebrovascular accident) (HCC) 11/07/2023   Anxiety 03/05/2016   Arthritis    rheumatoid   Asthma    Body aches 03/05/2016   Depression    Emphysema of lung (HCC)    Fibromyalgia    GERD (gastroesophageal reflux disease)    Hyperlipidemia    Hypertension    Obesity    Recent onset of diabetes mellitus (HCC) 03/10/2016   Referred to Dr Lenis and rx metformin  500 mg bid and zocor  20 mg 1 daily   Varicose vein of leg 03/05/2016   Vitiligo 03/05/2016   Past Surgical History:  Procedure Laterality Date   CAROTID ANGIOGRAPHY N/A 11/10/2023   Procedure: CAROTID ANGIOGRAPHY;  Surgeon: Lanis Fonda BRAVO, MD;  Location: [facility] INVASIVE CV LAB;  Service: Cardiovascular;  Laterality: N/A;   CHOLECYSTECTOMY     COLONOSCOPY N/A 08/20/2016   Procedure: COLONOSCOPY;  Surgeon: Claudis RAYMOND Rivet, MD;  Location: [facility] ENDO SUITE;  Service: Endoscopy;  Laterality: N/A;  830    DILATION AND CURETTAGE OF UTERUS     ENDARTERECTOMY Left 11/15/2023   Procedure: ENDARTERECTOMY, CAROTID;  Surgeon: Gretta Lonni PARAS, MD;  Location: [facility] OR;  Service: Vascular;  Laterality: Left;   GALLBLADDER SURGERY     LEG SURGERY Left    broke leg   PATCH  ANGIOPLASTY  11/15/2023   Procedure: ANGIOPLASTY, USING PATCH GRAFT;  Surgeon: Gretta Lonni PARAS, MD;  Location: [facility] OR;  Service: Vascular;;   POLYPECTOMY  08/20/2016   Procedure: POLYPECTOMY;  Surgeon: Claudis RAYMOND Rivet, MD;  Location: [facility] ENDO SUITE;  Service: Endoscopy;;  colon   TUBAL LIGATION     Patient Active Problem List   Diagnosis Date Noted   Gastroesophageal reflux disease without esophagitis 05/23/2024   Stroke (HCC) 04/16/2024   Carotid stenosis 11/15/2023   Symptomatic carotid artery stenosis, left 11/15/2023   Essential hypertension 11/08/2023   Controlled type 2 diabetes mellitus without complication, without long-term current use of insulin  (HCC) 11/08/2023   Acute kidney injury superimposed on chronic kidney disease 11/08/2023   Acute CVA (cerebrovascular accident) (HCC) 11/07/2023   Abdominal aortic atherosclerosis 06/03/2017   Primary osteoarthritis of both hands 04/08/2017   Primary osteoarthritis of both feet 04/08/2017   Primary osteoarthritis of both knees 04/08/2017   scoliosis, lumbar region 04/08/2017   DDD (degenerative disc disease), lumbar 04/08/2017   Positive ANA (antinuclear antibody) 04/08/2017   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   Smoker 03/04/2017   Rheumatoid arthritis (HCC) 12/03/2016   Vitamin D  deficiency 10/15/2016   Tubular adenoma of colon 08/21/2016  Fibromyalgia 05/28/2016   Type 2 diabetes, HbA1c goal < 7% (HCC) 04/30/2016   Mixed hyperlipidemia 04/30/2016   Morbid obesity due to excess calories (HCC) 04/30/2016   Anxiety 03/05/2016   Varicose vein of leg 03/05/2016    PCP: Bevely Doffing, FNP  REFERRING PROVIDER: Whitfield Raisin, NP  REFERRING DIAG: M54.16 (ICD-10-CM) - Lumbar radiculopathy  Rationale for Evaluation and Treatment: Rehabilitation  THERAPY DIAG:  M54.59  Radiculopathy, lumbar region  Impaired functional mobility, balance, gait, and endurance  Low back pain, unspecified back pain laterality, unspecified  chronicity, unspecified whether sciatica present  ONSET DATE: September 2025  SUBJECTIVE:                                                                                                                                                                                           SUBJECTIVE STATEMENT: Pt states pain is about a 1/10 this date. Pt states she has been doing the stretches at home and its been going good. Pt states increased burning with .    EVAL:Pt reports she had 2 strokes since September 2025 and she fell backwards onto water  heater and her backs been bothering her since. Reports she had 5 bulging discs 20 years ago but it got better with time but back pain returned after the strokes.  Reports her back feels stiff, and that it comes and goes.  Has N/T down BLE but RLE is worse, reports N/T down RLE is constant.   PERTINENT HISTORY:  CVA in 04/2024  Currently, she reports continued numbness (odd sensation) from her head down to her toes bilaterally. No significant improvement since discharge. She also notes persistent low back pain with right leg sciatica, reports a fall into her hot water  tank prior to ED presentation and symptoms have been persistent since that time. Has improved some but still greatly bothersome and limiting. PCP rx'd Robaxin  but no significant benefit.  No new stroke/TIA symptoms.   PAIN:  Are you having pain? Yes: NPRS scale: 2/10 for back, legs 3-4/10 Pain location: low back and BLE Pain description: stiff and achy for back, Numbness/tingling down legs Aggravating factors: Bending forward when shampooing hair Relieving factors: Heating pad   PRECAUTIONS: None  RED FLAGS: Reports since CVA her whole R side feels numb including her face, BLE N/T to toes   WEIGHT BEARING RESTRICTIONS: No  FALLS:  Has patient fallen in last 6 months? No  LIVING ENVIRONMENT: Lives with: lives alone Lives in: House/apartment Stairs: No Has following equipment  at home: Single point cane, does not use   OCCUPATION: Hair dresser, Full time   PLOF: Independent  PATIENT  GOALS: To be pain free  NEXT MD VISIT: February 2026  OBJECTIVE:  Note: Objective measures were completed at Evaluation unless otherwise noted.  DIAGNOSTIC FINDINGS:  IMPRESSION: 1. Multiple foci of restricted diffusion within the corpus callosum, primarily involving the body on the right and the splenium on the left, compatible with acute non-hemorrhagic infarcts. 2. Chronic encephalomalacia changes medially within the left frontal lobe with blooming artifact in the left caudate nucleus, presumably from hemosiderin staining. 3. Moderate cerebellar volume loss.  PATIENT SURVEYS:  Modified Oswestry: Modified Oswestry Low Back Pain Disability Questionnaire: 9 / 50 = 18.0 %   Interpretation of scores: Score Category Description  0-20% Minimal Disability The patient can cope with most living activities. Usually no treatment is indicated apart from advice on lifting, sitting and exercise  21-40% Moderate Disability The patient experiences more pain and difficulty with sitting, lifting and standing. Travel and social life are more difficult and they may be disabled from work. Personal care, sexual activity and sleeping are not grossly affected, and the patient can usually be managed by conservative means  41-60% Severe Disability Pain remains the main problem in this group, but activities of daily living are affected. These patients require a detailed investigation  61-80% Crippled Back pain impinges on all aspects of the patients life. Positive intervention is required  81-100% Bed-bound These patients are either bed-bound or exaggerating their symptoms  Bluford FORBES Zoe DELENA Karon DELENA, et al. Surgery versus conservative management of stable thoracolumbar fracture: the PRESTO feasibility RCT. [place] ([place]): [employer]; 2021 Nov. [facility] [employer] Assessment, No.  25.62.) Appendix 3, Oswestry Disability Index category descriptors. Available from: Findjewelers.cz  Minimally Clinically Important Difference (MCID) = 12.8%  COGNITION: Overall cognitive status: Within functional limits for tasks assessed     SENSATION: WFL mildly lighter in L2 dermatome    POSTURE: rounded shoulders and forward head  PALPATION: Unremarkable palpation to lumbar and thoracic spine Unremarkable palpation to R piriformis muscle   LUMBAR ROM:   AROM eval  Flexion Mid shin, slight bend in knees *burning in LB  Extension 10% avail   Right lateral flexion 1 inch past neutral,* sharp pain in back  Left lateral flexion 3 inch above knee  Right rotation 50% avail   Left rotation 50% avail    (Blank rows = not tested)   *=pain    LOWER EXTREMITY MMT:    MMT Right eval Left eval  Hip flexion 4- 4  Hip extension 4+ 4+  Hip abduction 4- 4  Hip adduction    Hip internal rotation    Hip external rotation    Knee flexion    Knee extension    Ankle dorsiflexion 5 5  Ankle plantarflexion    Ankle inversion    Ankle eversion     (Blank rows = not tested)   FUNCTIONAL TESTS:  30 seconds chair stand test 2 minute walk test: 331 feet, increased burning to bilateral lower extremities  30 seconds chair stand: 8.5 STS, inc N/T in legs and pulling in back, BUE push off from chair   GAIT: Distance walked: 100 feet Assistive device utilized: none Level of assistance: Complete Independence Comments: dec trunk rotation, slightly dec speed  TREATMENT DATE:  08/30/2024  completed Therapeutic Exercise: -Nustep, 5 minutes, level 4 resistance, pt cued for 70-80 spm -Supine bridges, 1 sets of 10 reps, 3 second holds, pt cued for max hip extension -Prone on elbows/prone press ups, 30 second holds, 3 reps, pt  cued for breathing  -Double knees to chest, 2 sets of 10 reps, on green theraball, pt cued for max pain free ROM and smooth  motion Neuromuscular Re-education: -Lower trunk rotations, 2 set of 10 reps, bilaterally, pt cued to remain in pain free ROM  -Side plank, 1 set of 7 reps of 5 second holds, bilaterally, pt cued for increased hip elevation and proper LE/UE placement -Modified crunch, 2 set of 5 reps, 3 second holds, pt cued for sequencing and UE/LE placement -Bird dog, 1 set of 5 reps, bilaterally, pt cued for increased hip ROM and neutral spine throughout movement Therapeutic Activity: -Lateral stepping, 2 laps, 20 feet per lap, with GTB around ankles, pt cued for upright posture and slight bend in knees   08/22/24 Review of HEP and goals Prone lying with moist heat x 5' Prone glute sets 5 hold x 10 Supine: LTR x 10 Single knee to chest 5 x 20 Abdominal bracing 5 x 10 Abdominal bracing red theraband 2 x 10 Updated HEP     08/17/24: PT Eval and HEP                                                                                                                                 PATIENT EDUCATION:  Education details: PT evaluation, objective findings, POC, Importance of HEP, Precautions, Clinic policies Person educated: Patient Education method: Explanation and Demonstration Education comprehension: verbalized understanding and returned demonstration  HOME EXERCISE PROGRAM: Access Code: [email or web address] URL: [email or web address] Date: 08/17/2024 Prepared by: Rosaria Powell-Butler  Exercises - Prone Press Up On Elbows  - 3 x daily - 7 x weekly - 3 sets - 5 hold - Supine Lower Trunk Rotation  - 2 x daily - 7 x weekly - 2 sets - 10 reps - Hooklying Single Knee to Chest Stretch  - 2 x daily - 7 x weekly - 2 sets - 10 reps - Standing Lumbar Extension at Wall  - 2 x daily - 7 x weekly - 2 sets - 10 reps  ASSESSMENT:  CLINICAL IMPRESSION: Patient continues to demonstrate pain in low back, decreased core and proximal LE strength, decreased gait quality and burning pain down RLE. Patient also  demonstrates decreased endurance with aerobic based exercise during today's session with , due to increased burning in LEs. Patient able to progress dynamic balance and core activation exercises today with bird dogs and crunch variations, good performance with verbal cueing. Patient educated on importance of consistent HEP compliance and extension movements for decreased radicular symptoms. Patient would continue to benefit from skilled physical therapy for decreased low back pain, increased endurance with ambulation, increased core and LE mobility, and improved balance for improved quality of life, improved independence with community ambulation and continued progress towards therapy goals.       Eval: Patient is a 59 y.o. female who was seen today for physical therapy evaluation and treatment for M54.16 (ICD-10-CM) - Lumbar  radiculopathy.  On this date, patient demonstrates slightly impaired self perception of function, decreased lumbar ROM/mobility, decreased RLE strength, decreased endurance, and altered gait, all of which may be contributing to patient's increased low back and LE radiating pain, decreased activity tolerance, and impairing their overall function. Patient demonstrates a preference to extension positioning this date. HEP designed with that in mind. Patient reports prolonged standing and forward bending for job duties. Encouraged patient to sit a few minutes every hour during work day as well as lumbar extension against wall. Patient will benefit from skilled physical therapy to address the above/below deficits in order to improve pain and overall function.    OBJECTIVE IMPAIRMENTS: Abnormal gait, decreased activity tolerance, decreased endurance, decreased mobility, decreased ROM, decreased strength, impaired perceived functional ability, impaired sensation, improper body mechanics, postural dysfunction, and pain.   ACTIVITY LIMITATIONS: carrying, lifting, bending, standing, and  transfers  PARTICIPATION LIMITATIONS: community activity and occupation  PERSONAL FACTORS: Time since onset of injury/illness/exacerbation are also affecting patient's functional outcome.   REHAB POTENTIAL: Good  CLINICAL DECISION MAKING: Stable/uncomplicated  EVALUATION COMPLEXITY: Low   GOALS: Goals reviewed with patient? No  SHORT TERM GOALS: Target date: 09/08/24 Patient will be independent with performance of HEP to demonstrate adequate self management of symptoms.  Baseline:  Goal status: in progress  2.   Patient will report at least a 25% improvement with function and/or pain reduction overall since beginning PT. Baseline:  Goal status: in progress   LONG TERM GOALS: Target date: 09/29/24 Patient will improve Modified Oswestry score by 12.8 % in order to demonstrate improved self-perceived disability and overall function while meeting MCID.  Baseline: Goal status: in progress   2.  Patient will improve  30 second chair stand  test by at least 2 STS  in order to demonstrate improved LE strength and endurance required for prolonged standing/ambulation. Baseline:  Goal status: in progress   3.  Patient will improve lumbar extension AROM to at least 50% of available motion in order to demonstrate improved  mobility needed for functional activities such as overhead reaching. Baseline:  Goal status: in progress   4.  Patient will report overall 50% improvement since beginning PT. Baseline:  Goal status:in progress  5.  Patient will report decreased pain in low back and BLE at end of work day, as 3/10 or less, to demonstrate improved activity tolerance.  Baseline:  Goal status: in progress   PLAN:  PT FREQUENCY: 1-2x/week  PT DURATION: 6 weeks  PLANNED INTERVENTIONS: 97164- PT Re-evaluation, 97110-Therapeutic exercises, 97530- Therapeutic activity, [id number]- Neuromuscular re-education, 97535- Self Care, [id number]- Manual therapy, [id number]- Gait training, [phone]- Electrical  stimulation (manual), [id number]- Ultrasound, [id number]- Traction (mechanical), [phone] (1-2 muscles), [id number] (3+ muscles)- Dry Needling, Patient/Family education, Balance training, Stair training, Taping, Joint mobilization, Spinal mobilization, Cryotherapy, and Moist heat.  PLAN FOR NEXT SESSION:  progress HEP, core and proximal LE strengthening, assess hamstring and piriformis   Lang Ada, PT, DPT [facility] Office: [phone] 8:17 AM, 08/30/24   "

## 2024-09-06 ENCOUNTER — Ambulatory Visit (HOSPITAL_COMMUNITY)

## 2024-09-11 ENCOUNTER — Ambulatory Visit (HOSPITAL_COMMUNITY)

## 2024-09-13 ENCOUNTER — Encounter (HOSPITAL_COMMUNITY): Payer: Self-pay

## 2024-09-13 ENCOUNTER — Ambulatory Visit (HOSPITAL_COMMUNITY)

## 2024-09-13 DIAGNOSIS — M545 Low back pain, unspecified: Secondary | ICD-10-CM

## 2024-09-13 DIAGNOSIS — M5416 Radiculopathy, lumbar region: Secondary | ICD-10-CM

## 2024-09-13 DIAGNOSIS — M5459 Other low back pain: Secondary | ICD-10-CM

## 2024-09-13 DIAGNOSIS — Z7409 Other reduced mobility: Secondary | ICD-10-CM

## 2024-09-13 NOTE — Therapy (Addendum)
 " OUTPATIENT PHYSICAL THERAPY THORACOLUMBAR TREATMENT   [name] MRN: [id number] DOB:June 15, 1966, 59 y.o., female Today's Date: 09/13/2024  END OF SESSION:  PT End of Session - 09/13/24 0735     Visit Number 4    Number of Visits 13    Date for Recertification  09/15/24    Authorization Type Medicaid [employer]    Authorization Time Period approved 4 visits from 1/8 to 2/6; requested more visits 09/13/24    Authorization - Visit Number 3    Authorization - Number of Visits 4    Progress Note Due on Visit 10    PT Start Time 0735    PT Stop Time 0820    PT Time Calculation (min) 45 min    Activity Tolerance Patient tolerated treatment well    Behavior During Therapy [facility] for tasks assessed/performed           Past Medical History:  Diagnosis Date   Acute CVA (cerebrovascular accident) (HCC) 11/07/2023   Anxiety 03/05/2016   Arthritis    rheumatoid   Asthma    Body aches 03/05/2016   Depression    Emphysema of lung (HCC)    Fibromyalgia    GERD (gastroesophageal reflux disease)    Hyperlipidemia    Hypertension    Obesity    Recent onset of diabetes mellitus (HCC) 03/10/2016   Referred to Dr Lenis and rx metformin  500 mg bid and zocor  20 mg 1 daily   Varicose vein of leg 03/05/2016   Vitiligo 03/05/2016   Past Surgical History:  Procedure Laterality Date   CAROTID ANGIOGRAPHY N/A 11/10/2023   Procedure: CAROTID ANGIOGRAPHY;  Surgeon: Lanis Fonda BRAVO, MD;  Location: [facility] INVASIVE CV LAB;  Service: Cardiovascular;  Laterality: N/A;   CHOLECYSTECTOMY     COLONOSCOPY N/A 08/20/2016   Procedure: COLONOSCOPY;  Surgeon: Claudis RAYMOND Rivet, MD;  Location: [facility] ENDO SUITE;  Service: Endoscopy;  Laterality: N/A;  830    DILATION AND CURETTAGE OF UTERUS     ENDARTERECTOMY Left 11/15/2023   Procedure: ENDARTERECTOMY, CAROTID;  Surgeon: Gretta Lonni PARAS, MD;  Location: [facility] OR;  Service: Vascular;  Laterality: Left;   GALLBLADDER SURGERY     LEG SURGERY Left     broke leg   PATCH ANGIOPLASTY  11/15/2023   Procedure: ANGIOPLASTY, USING PATCH GRAFT;  Surgeon: Gretta Lonni PARAS, MD;  Location: [facility] OR;  Service: Vascular;;   POLYPECTOMY  08/20/2016   Procedure: POLYPECTOMY;  Surgeon: Claudis RAYMOND Rivet, MD;  Location: [facility] ENDO SUITE;  Service: Endoscopy;;  colon   TUBAL LIGATION     Patient Active Problem List   Diagnosis Date Noted   Gastroesophageal reflux disease without esophagitis 05/23/2024   Stroke (HCC) 04/16/2024   Carotid stenosis 11/15/2023   Symptomatic carotid artery stenosis, left 11/15/2023   Essential hypertension 11/08/2023   Controlled type 2 diabetes mellitus without complication, without long-term current use of insulin  (HCC) 11/08/2023   Acute kidney injury superimposed on chronic kidney disease 11/08/2023   Acute CVA (cerebrovascular accident) (HCC) 11/07/2023   Abdominal aortic atherosclerosis 06/03/2017   Primary osteoarthritis of both hands 04/08/2017   Primary osteoarthritis of both feet 04/08/2017   Primary osteoarthritis of both knees 04/08/2017   scoliosis, lumbar region 04/08/2017   DDD (degenerative disc disease), lumbar 04/08/2017   Positive ANA (antinuclear antibody) 04/08/2017   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   Smoker 03/04/2017   Rheumatoid arthritis (HCC) 12/03/2016   Vitamin D  deficiency 10/15/2016   Tubular adenoma  of colon 08/21/2016   Fibromyalgia 05/28/2016   Type 2 diabetes, HbA1c goal < 7% (HCC) 04/30/2016   Mixed hyperlipidemia 04/30/2016   Morbid obesity due to excess calories (HCC) 04/30/2016   Anxiety 03/05/2016   Varicose vein of leg 03/05/2016    PCP: Bevely Doffing, FNP  REFERRING PROVIDER: Whitfield Raisin, NP  REFERRING DIAG: M54.16 (ICD-10-CM) - Lumbar radiculopathy  Rationale for Evaluation and Treatment: Rehabilitation  THERAPY DIAG:  Radiculopathy, lumbar region  Impaired functional mobility, balance, gait, and endurance  Low back pain, unspecified back pain laterality, unspecified  chronicity, unspecified whether sciatica present  M54.59  ONSET DATE: September 2025  SUBJECTIVE:                                                                                                                                                                                           SUBJECTIVE STATEMENT: No reports of pain today, continues to have numbness down posterior legs to toes that is constant.  Feels improvements with knowledge on new stretches, completed daily.  Feels she has improved by 60% since beginning therapy.     EVAL:Pt reports she had 2 strokes since September 2025 and she fell backwards onto water  heater and her backs been bothering her since. Reports she had 5 bulging discs 20 years ago but it got better with time but back pain returned after the strokes.  Reports her back feels stiff, and that it comes and goes.  Has N/T down BLE but RLE is worse, reports N/T down RLE is constant.   PERTINENT HISTORY:  CVA in 04/2024  Currently, she reports continued numbness (odd sensation) from her head down to her toes bilaterally. No significant improvement since discharge. She also notes persistent low back pain with right leg sciatica, reports a fall into her hot water  tank prior to ED presentation and symptoms have been persistent since that time. Has improved some but still greatly bothersome and limiting. PCP rx'd Robaxin  but no significant benefit.  No new stroke/TIA symptoms.   PAIN:  Are you having pain? Yes: NPRS scale: 2/10 for back, legs 3-4/10 Pain location: low back and BLE Pain description: stiff and achy for back, Numbness/tingling down legs Aggravating factors: Bending forward when shampooing hair Relieving factors: Heating pad   PRECAUTIONS: None  RED FLAGS: Reports since CVA her whole R side feels numb including her face, BLE N/T to toes   WEIGHT BEARING RESTRICTIONS: No  FALLS:  Has patient fallen in last 6 months? No  LIVING ENVIRONMENT: Lives  with: lives alone Lives in: House/apartment Stairs: No Has following equipment at home: Single point cane, does not use  OCCUPATION: Hair dresser, Full time   PLOF: Independent  PATIENT GOALS: To be pain free  NEXT MD VISIT: February 2026  OBJECTIVE:  Note: Objective measures were completed at Evaluation unless otherwise noted.  DIAGNOSTIC FINDINGS:  IMPRESSION: 1. Multiple foci of restricted diffusion within the corpus callosum, primarily involving the body on the right and the splenium on the left, compatible with acute non-hemorrhagic infarcts. 2. Chronic encephalomalacia changes medially within the left frontal lobe with blooming artifact in the left caudate nucleus, presumably from hemosiderin staining. 3. Moderate cerebellar volume loss.  PATIENT SURVEYS:  Modified Oswestry: Modified Oswestry Low Back Pain Disability Questionnaire: 9 / 50 = 18.0 % Modified Oswestry:  MODIFIED OSWESTRY DISABILITY SCALE  Date: 09/13/24 Score  Pain intensity 1 = The pain is bad, but I can manage without having to take pain medication  2. Personal care (washing, dressing, etc.) 0 =  I can take care of myself normally without causing increased pain.  3. Lifting 0 = I can lift heavy weights without increased pain.  4. Walking 0 = Pain does not prevent me from walking any distance  5. Sitting 3 =  Pain prevents me from sitting more than  hour.  6. Standing 0 =  I can stand as long as I want without increased pain.  7. Sleeping 0 = Pain does not prevent me from sleeping well.  8. Social Life 0 = My social life is normal and does not increase my pain.  9. Traveling 0 =  I can travel anywhere without increased pain.  10. Employment/ Homemaking 1 = My normal homemaking/job activities increase my pain, but I can still perform all that is required of me  Total 5/50   Interpretation of scores: Score Category Description  0-20% Minimal Disability The patient can cope with most living activities.  Usually no treatment is indicated apart from advice on lifting, sitting and exercise  21-40% Moderate Disability The patient experiences more pain and difficulty with sitting, lifting and standing. Travel and social life are more difficult and they may be disabled from work. Personal care, sexual activity and sleeping are not grossly affected, and the patient can usually be managed by conservative means  41-60% Severe Disability Pain remains the main problem in this group, but activities of daily living are affected. These patients require a detailed investigation  61-80% Crippled Back pain impinges on all aspects of the patients life. Positive intervention is required  81-100% Bed-bound These patients are either bed-bound or exaggerating their symptoms  Bluford FORBES Zoe DELENA Karon DELENA, et al. Surgery versus conservative management of stable thoracolumbar fracture: the PRESTO feasibility RCT. [place] ([place]): [employer]; 2021 Nov. [facility] Technology Assessment, No. 25.62.) Appendix 3, Oswestry Disability Index category descriptors. Available from: Findjewelers.cz  Minimally Clinically Important Difference (MCID) = 12.8%    Interpretation of scores: Score Category Description  0-20% Minimal Disability The patient can cope with most living activities. Usually no treatment is indicated apart from advice on lifting, sitting and exercise  21-40% Moderate Disability The patient experiences more pain and difficulty with sitting, lifting and standing. Travel and social life are more difficult and they may be disabled from work. Personal care, sexual activity and sleeping are not grossly affected, and the patient can usually be managed by conservative means  41-60% Severe Disability Pain remains the main problem in this group, but activities of daily living are affected. These patients require a detailed investigation  61-80% Crippled Back pain impinges on all  aspects  of the patients life. Positive intervention is required  81-100% Bed-bound These patients are either bed-bound or exaggerating their symptoms  Bluford FORBES Zoe DELENA Karon DELENA, et al. Surgery versus conservative management of stable thoracolumbar fracture: the PRESTO feasibility RCT. [place] [employer]; 2021 Nov. [facility] Technology Assessment, No. 25.62.) Appendix 3, Oswestry Disability Index category descriptors. Available from: Findjewelers.cz  Minimally Clinically Important Difference (MCID) = 12.8%  COGNITION: Overall cognitive status: Within functional limits for tasks assessed     SENSATION: WFL mildly lighter in L2 dermatome    POSTURE: rounded shoulders and forward head  PALPATION: Unremarkable palpation to lumbar and thoracic spine Unremarkable palpation to R piriformis muscle   LUMBAR ROM:   AROM eval 09/13/24  Flexion Mid shin, slight bend in knees *burning in LB Touches toes with slight bend in knees, feels a pull in back, burning in LB  Extension 10% avail  50% available  Right lateral flexion 1 inch past neutral,* sharp pain in back 1 inch past neutral, no pain just tightness  Left lateral flexion 3 inch above knee 2in above knee  Right rotation 50% avail  75% available  Left rotation 50% avail  75% available   (Blank rows = not tested)   *=pain    LOWER EXTREMITY MMT:    MMT Right eval Left eval Right 09/13/24 Left 09/13/24  Hip flexion 4- 4 4 4   Hip extension 4+ 4+ 4+ 4+  Hip abduction 4- 4 4- 4  Hip adduction      Hip internal rotation      Hip external rotation      Knee flexion   5 5  Knee extension      Ankle dorsiflexion 5 5    Ankle plantarflexion      Ankle inversion      Ankle eversion       (Blank rows = not tested)  Hamstring:  Left 120 degrees   Right 110     FUNCTIONAL TESTS:  30 seconds chair stand test 2 minute walk test: 331 feet, increased burning to bilateral lower extremities  30  seconds chair stand: 8.5 STS, inc N/T in legs and pulling in back, BUE push off from chair   09/13/24:  423ft increased burning to lower extremities.  30 seconds chair stand: 14 STS, inc N/T in legs and pulling in back, BUE push off from thigh   GAIT: Distance walked: 100 feet Assistive device utilized: none Level of assistance: Complete Independence Comments: dec trunk rotation, slightly dec speed  TREATMENT DATE:  09/13/24: 30 second STS test ROM Oswestry MMT  Supine: - Hamstring stretch with hands behind knee then with rope 2x 30 each  Seated - Piriformis stretch 2x 30  08/30/2024  completed Therapeutic Exercise: -Nustep, 5 minutes, level 4 resistance, pt cued for 70-80 spm -Supine bridges, 1 sets of 10 reps, 3 second holds, pt cued for max hip extension -Prone on elbows/prone press ups, 30 second holds, 3 reps, pt cued for breathing  -Double knees to chest, 2 sets of 10 reps, on green theraball, pt cued for max pain free ROM and smooth motion Neuromuscular Re-education: -Lower trunk rotations, 2 set of 10 reps, bilaterally, pt cued to remain in pain free ROM  -Side plank, 1 set of 7 reps of 5 second holds, bilaterally, pt cued for increased hip elevation and proper LE/UE placement -Modified crunch, 2 set of 5 reps, 3 second holds, pt cued for sequencing  and UE/LE placement -Bird dog, 1 set of 5 reps, bilaterally, pt cued for increased hip ROM and neutral spine throughout movement Therapeutic Activity: -Lateral stepping, 2 laps, 20 feet per lap, with GTB around ankles, pt cued for upright posture and slight bend in knees   08/22/24 Review of HEP and goals Prone lying with moist heat x 5' Prone glute sets 5 hold x 10 Supine: LTR x 10 Single knee to chest 5 x 20 Abdominal bracing 5 x 10 Abdominal bracing red theraband 2 x 10 Updated HEP     08/17/24: PT Eval and HEP                                                                                                                                  PATIENT EDUCATION:  Education details: PT evaluation, objective findings, POC, Importance of HEP, Precautions, Clinic policies Person educated: Patient Education method: Explanation and Demonstration Education comprehension: verbalized understanding and returned demonstration  HOME EXERCISE PROGRAM: Access Code: [email or web address] URL: [email or web address] Date: 08/17/2024 Prepared by: Rosaria Powell-Butler  Exercises - Prone Press Up On Elbows  - 3 x daily - 7 x weekly - 3 sets - 5 hold - Supine Lower Trunk Rotation  - 2 x daily - 7 x weekly - 2 sets - 10 reps - Hooklying Single Knee to Chest Stretch  - 2 x daily - 7 x weekly - 2 sets - 10 reps - Standing Lumbar Extension at Wall  - 2 x daily - 7 x weekly - 2 sets - 10 reps  - Supine Hamstring Stretch with Strap  - 2 x daily - 7 x weekly - 1 sets - 3 reps - 30 hold - Seated Piriformis Stretch with Trunk Bend  - 2 x daily - 7 x weekly - 1 sets - 3 reps - 30 hold ASSESSMENT:  CLINICAL IMPRESSION: Reviewed goals per insurance approval dates with the following findings:  Pt has met 2/2 STG and 3/5 LTGs.  Pt reports compliance with HEP daily and feels she has improved by 60%.  Presents with improvements with increased cadence during , ability to complete 14 STS during 30 second STS testing, ROM and strength are progressing well.  Pt continues to have pain and radicular symptoms, proximal weakness and mobility.  Assessed piriformis and hamstring tightness with additional stretches given this session with report of assistance with pain, added this stretches to HEP with printout given and verbalized understanding.  Pt will continue to benefit from skilled intervention to address goals not met.   Eval: Patient is a 59 y.o. female who was seen today for physical therapy evaluation and treatment for M54.16 (ICD-10-CM) - Lumbar radiculopathy.  On this date, patient demonstrates slightly impaired  self perception of function, decreased lumbar ROM/mobility, decreased RLE strength, decreased endurance, and altered gait, all of which may be contributing to patient's increased low back and LE radiating pain, decreased activity tolerance, and impairing  their overall function. Patient demonstrates a preference to extension positioning this date. HEP designed with that in mind. Patient reports prolonged standing and forward bending for job duties. Encouraged patient to sit a few minutes every hour during work day as well as lumbar extension against wall. Patient will benefit from skilled physical therapy to address the above/below deficits in order to improve pain and overall function.    OBJECTIVE IMPAIRMENTS: Abnormal gait, decreased activity tolerance, decreased endurance, decreased mobility, decreased ROM, decreased strength, impaired perceived functional ability, impaired sensation, improper body mechanics, postural dysfunction, and pain.   ACTIVITY LIMITATIONS: carrying, lifting, bending, standing, and transfers  PARTICIPATION LIMITATIONS: community activity and occupation  PERSONAL FACTORS: Time since onset of injury/illness/exacerbation are also affecting patient's functional outcome.   REHAB POTENTIAL: Good  CLINICAL DECISION MAKING: Stable/uncomplicated  EVALUATION COMPLEXITY: Low   GOALS: Goals reviewed with patient? No  SHORT TERM GOALS: Target date: 09/08/24 Patient will be independent with performance of HEP to demonstrate adequate self management of symptoms.  Baseline: 09/13/24:  Reports compliance with stretches daily. Goal status: MET  2.   Patient will report at least a 25% improvement with function and/or pain reduction overall since beginning PT. Baseline: 09/13/24:  Improvements by 60% Goal status: MET   LONG TERM GOALS: Target date: 09/29/24 Patient will improve Modified Oswestry score by 12.8 % in order to demonstrate improved self-perceived disability and overall  function while meeting MCID.  Baseline: 09/13/24: 5/50= 10%  was 9/50 Goal status: MET   2.  Patient will improve  30 second chair stand  test by at least 2 STS  in order to demonstrate improved LE strength and endurance required for prolonged standing/ambulation. Baseline: 09/13/24:  14 times without HHA, increased burning. Goal status: MET   3.  Patient will improve lumbar extension AROM to at least 50% of available motion in order to demonstrate improved  mobility needed for functional activities such as overhead reaching. Baseline: see above Goal status: in progress   4.  Patient will report overall 50% improvement since beginning PT. Baseline: 09/13/24:  Improvements by 60% Goal status:MET  5.  Patient will report decreased pain in low back and BLE at end of work day, as 3/10 or less, to demonstrate improved activity tolerance.  Baseline:  Goal status: in progress   PLAN:  PT FREQUENCY: 1-2x/week  PT DURATION: 6 weeks  PLANNED INTERVENTIONS: 97164- PT Re-evaluation, 97110-Therapeutic exercises, 97530- Therapeutic activity, [id number]- Neuromuscular re-education, 97535- Self Care, 02859- Manual therapy, [id number]- Gait training, [phone]- Electrical stimulation (manual), [id number]- Ultrasound, [id number]- Traction (mechanical), [phone] (1-2 muscles), 20561 (3+ muscles)- Dry Needling, Patient/Family education, Balance training, Stair training, Taping, Joint mobilization, Spinal mobilization, Cryotherapy, and Moist heat.  PLAN FOR NEXT SESSION:  progress HEP, core and proximal LE strengthening.  Add 3D hip excursion and continue with mobility exercises.  Augustin Mclean, LPTA/CLT; CBIS [phone]  10:07 AM, 09/13/24   Managed Medicaid Authorization Request Treatment Start Date: 10-10-2023  Visit Dx Codes: M54.59 M54.16 Z74.09  Functional Tool Score: Modified Oswestry Low Back Pain Disability Questionnaire: 5 / 50 = 10.0 % 30 seconds chair stand: 14 STS  For all possible CPT codes, reference the  Planned Interventions line above.     Check all conditions that are expected to impact treatment: {Conditions expected to impact treatment:None of these apply   If treatment provided at initial evaluation, no treatment charged due to lack of authorization.      "

## 2024-09-14 ENCOUNTER — Ambulatory Visit

## 2024-09-14 NOTE — Addendum Note (Signed)
 Addended by: LUDGER HEART E on: 09/14/2024 09:18 AM   Modules accepted: Orders

## 2024-09-18 ENCOUNTER — Ambulatory Visit (HOSPITAL_COMMUNITY)

## 2024-09-20 ENCOUNTER — Ambulatory Visit (HOSPITAL_COMMUNITY): Admitting: Physical Therapy

## 2024-09-21 ENCOUNTER — Inpatient Hospital Stay: Admitting: Oncology

## 2024-09-21 ENCOUNTER — Inpatient Hospital Stay: Attending: Medical Oncology

## 2024-09-25 ENCOUNTER — Ambulatory Visit (HOSPITAL_COMMUNITY): Admitting: Physical Therapy

## 2024-09-27 ENCOUNTER — Ambulatory Visit (HOSPITAL_COMMUNITY): Admitting: Physical Therapy

## 2024-09-28 ENCOUNTER — Ambulatory Visit: Admitting: Adult Health

## 2024-10-02 ENCOUNTER — Ambulatory Visit (HOSPITAL_COMMUNITY): Admitting: Physical Therapy

## 2024-10-04 ENCOUNTER — Ambulatory Visit (HOSPITAL_COMMUNITY): Admitting: Physical Therapy

## 2024-10-09 ENCOUNTER — Ambulatory Visit (HOSPITAL_COMMUNITY): Admitting: Physical Therapy

## 2024-10-11 ENCOUNTER — Ambulatory Visit (HOSPITAL_COMMUNITY)

## 2024-10-12 ENCOUNTER — Ambulatory Visit

## 2024-10-19 ENCOUNTER — Ambulatory Visit (HOSPITAL_COMMUNITY)

## 2024-10-19 ENCOUNTER — Ambulatory Visit
